# Patient Record
Sex: Male | Born: 1975
Health system: Southern US, Community
[De-identification: ages and names within clinical notes are randomized; demographics above are authoritative.]

## PROBLEM LIST (undated history)

## (undated) DIAGNOSIS — E7800 Pure hypercholesterolemia, unspecified: Secondary | ICD-10-CM

## (undated) DIAGNOSIS — E119 Type 2 diabetes mellitus without complications: Secondary | ICD-10-CM

## (undated) DIAGNOSIS — K819 Cholecystitis, unspecified: Secondary | ICD-10-CM

## (undated) DIAGNOSIS — G4733 Obstructive sleep apnea (adult) (pediatric): Secondary | ICD-10-CM

## (undated) DIAGNOSIS — I10 Essential (primary) hypertension: Secondary | ICD-10-CM

## (undated) HISTORY — PX: ADENOIDECTOMY: SUR15

## (undated) HISTORY — DX: Obstructive sleep apnea (adult) (pediatric): G47.33

## (undated) HISTORY — DX: Essential (primary) hypertension: I10

---

## 2016-07-25 ENCOUNTER — Encounter: Payer: Self-pay | Admitting: Emergency Medicine

## 2016-07-25 ENCOUNTER — Ambulatory Visit
Admission: EM | Admit: 2016-07-25 | Discharge: 2016-07-25 | Disposition: A | Discharge status: Home / Self Care | Payer: Self-pay | Attending: Family Medicine | Admitting: Family Medicine

## 2016-07-25 DIAGNOSIS — R600 Localized edema: Secondary | ICD-10-CM

## 2016-07-25 DIAGNOSIS — I809 Phlebitis and thrombophlebitis of unspecified site: Secondary | ICD-10-CM

## 2016-07-25 DIAGNOSIS — R609 Edema, unspecified: Secondary | ICD-10-CM

## 2016-07-25 NOTE — ED Provider Notes (Signed)
[facility]    CSN: [id number] Arrival date & time: 07/25/16  1506     History   Chief Complaint Chief Complaint  Patient presents with  . Leg Swelling    left leg    HPI Steve [name] is a 41 y.o. male.   41 yo male with a c/o 2 days of lower leg swelling of both legs as well as a slightly tender area of skin on his left shin. Denies any injuries, falls, fevers, chills, drainage, shortness of breath, chest pains.    The history is provided by the patient.    History reviewed. No pertinent past medical history.  There are no active problems to display for this patient.   Past Surgical History:  Procedure Laterality Date  . ADENOIDECTOMY         Home Medications    Prior to Admission medications   Not on File    Family History History reviewed. No pertinent family history.  Social History Social History  Substance Use Topics  . Smoking status: Former Games developer  . Smokeless tobacco: Former Neurosurgeon    Types: Snuff  . Alcohol use No     Allergies   Patient has no known allergies.   Review of Systems Review of Systems   Physical Exam Triage Vital Signs ED Triage Vitals  Enc Vitals Group     BP 07/25/16 1535 135/83     Pulse Rate 07/25/16 1535 66     Resp 07/25/16 1535 17     Temp 07/25/16 1535 98.2 F (36.8 C)     Temp Source 07/25/16 1535 Oral     SpO2 07/25/16 1535 98 %     Weight 07/25/16 1531 (!) 350 lb (158.8 kg)     Height 07/25/16 1531  (1.778 m)     Head Circumference --      Peak Flow --      Pain Score 07/25/16 1531 1     Pain Loc --      Pain Edu? --      Excl. in GC? --    No data found.   Updated Vital Signs BP 135/83 (BP Location: Left Arm)   Pulse 66   Temp 98.2 F (36.8 C) (Oral)   Resp 17   Ht  (1.778 m)   Wt (!) 350 lb (158.8 kg)   SpO2 98%   BMI 50.22 kg/m   Visual Acuity Right Eye Distance:   Left Eye Distance:   Bilateral Distance:    Right Eye Near:   Left Eye Near:      Bilateral Near:     Physical Exam  Constitutional: He appears well-developed and well-nourished. No distress.  Musculoskeletal: He exhibits edema (1+ tibial pitting edema lower extremities bilaterally).  Skin: He is not diaphoretic.  Superficial vein skin area approximately 3cm with few slight indurations on palpation and slightly tender on left anterior shin area; no drainage or bleeding; no warmth or severe erythema  Nursing note and vitals reviewed.    UC Treatments / Results  Labs (all labs ordered are listed, but only abnormal results are displayed) Labs Reviewed - No data to display  EKG  EKG Interpretation None       Radiology No results found.  Procedures Procedures (including critical care time)  Medications Ordered in UC Medications - No data to display   Initial Impression / Assessment and Plan / UC Course  I have reviewed the triage vital  signs and the nursing notes.  Pertinent labs & imaging results that were available during my care of the patient were reviewed by me and considered in my medical decision making (see chart for details).       Final Clinical Impressions(s) / UC Diagnoses   Final diagnoses:  Thrombophlebitis  Dependent edema    New Prescriptions There are no discharge medications for this patient.  1. diagnosis reviewed with patient/parent/guardian/family 2. rx as per orders above; reviewed possible side effects, interactions, risks and benefits  3. Recommend supportive treatment with  4. Follow-up prn if symptoms worsen or don't improve   Payton Mccallum, MD 07/25/16 2052

## 2016-07-25 NOTE — Discharge Instructions (Signed)
Decrease salt intake, elevate legs

## 2016-07-25 NOTE — ED Triage Notes (Signed)
Patient c/o left lower leg swelling for the past 2 days.  Patient states that his blood pressure was elevated today.

## 2017-12-18 DIAGNOSIS — Z23 Encounter for immunization: Secondary | ICD-10-CM | POA: Diagnosis not present

## 2018-06-25 DIAGNOSIS — J069 Acute upper respiratory infection, unspecified: Secondary | ICD-10-CM | POA: Diagnosis not present

## 2018-08-15 DIAGNOSIS — R109 Unspecified abdominal pain: Secondary | ICD-10-CM | POA: Diagnosis not present

## 2018-08-15 DIAGNOSIS — K8020 Calculus of gallbladder without cholecystitis without obstruction: Secondary | ICD-10-CM | POA: Diagnosis not present

## 2018-08-15 DIAGNOSIS — R1110 Vomiting, unspecified: Secondary | ICD-10-CM | POA: Diagnosis not present

## 2018-08-15 DIAGNOSIS — A0839 Other viral enteritis: Secondary | ICD-10-CM | POA: Diagnosis not present

## 2018-08-15 DIAGNOSIS — K429 Umbilical hernia without obstruction or gangrene: Secondary | ICD-10-CM | POA: Diagnosis not present

## 2018-08-15 DIAGNOSIS — Z1159 Encounter for screening for other viral diseases: Secondary | ICD-10-CM | POA: Diagnosis not present

## 2018-08-15 DIAGNOSIS — K529 Noninfective gastroenteritis and colitis, unspecified: Secondary | ICD-10-CM | POA: Diagnosis not present

## 2018-09-25 ENCOUNTER — Emergency Department: Payer: BC Managed Care – PPO

## 2018-09-25 ENCOUNTER — Encounter: Payer: Self-pay | Admitting: *Deleted

## 2018-09-25 ENCOUNTER — Emergency Department
Admission: EM | Admit: 2018-09-25 | Discharge: 2018-09-25 | Disposition: A | Discharge status: Home / Self Care | Payer: BC Managed Care – PPO | Attending: Emergency Medicine | Admitting: Emergency Medicine

## 2018-09-25 ENCOUNTER — Other Ambulatory Visit: Payer: Self-pay

## 2018-09-25 DIAGNOSIS — R0602 Shortness of breath: Secondary | ICD-10-CM | POA: Diagnosis not present

## 2018-09-25 DIAGNOSIS — F17203 Nicotine dependence unspecified, with withdrawal: Secondary | ICD-10-CM | POA: Diagnosis not present

## 2018-09-25 DIAGNOSIS — F419 Anxiety disorder, unspecified: Secondary | ICD-10-CM | POA: Diagnosis not present

## 2018-09-25 DIAGNOSIS — F410 Panic disorder [episodic paroxysmal anxiety] without agoraphobia: Secondary | ICD-10-CM

## 2018-09-25 MED ORDER — LORAZEPAM 1 MG PO TABS: 1.0000 mg | ORAL_TABLET | Freq: Three times a day (TID) | ORAL | 0 refills | Status: DC | PRN

## 2018-09-25 MED ORDER — NICOTINE POLACRILEX 4 MG MT GUM: 4.0000 mg | CHEWING_GUM | OROMUCOSAL | 1 refills | Status: DC | PRN

## 2018-09-25 NOTE — ED Notes (Signed)
Pt refusing blood work at this time and verbalized only wanting to have his heart and lungs checked.

## 2018-09-25 NOTE — ED Triage Notes (Signed)
Pt to ED reporting a panic attack earlier today. No hx of anxiety. Pt reports feeling SOB last night and reports fears with COVID and the potential for getting sick made his anxiety worse today.   Pt no longer feeling SOB. No fevers or cough. Pt is also withdrawling from nicotine for the past 48 hours.

## 2018-09-25 NOTE — ED Provider Notes (Signed)
[facility] Emergency Department Provider Note  ____________________________________________  Time seen: Approximately 9:22 PM  I have reviewed the triage vital signs and the nursing notes.   HISTORY  Chief Complaint Panic Attack and Shortness of Breath    HPI Steve [name] is a 43 y.o. male who presents the emergency department complaining of possible panic/anxiety attack.  Patient reports that he has stopped using nicotine-containing products, specifically drip over the past 48 hours.  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  Patient began having shortness of breath and believes that he had a slight panic attack.  Patient has no history of anxiety or panic attacks.  Patient was able to control his breathing and symptoms resolved.  Patient presented to the emergency department for evaluation to ensure there was no other underlying condition.  Patient initially declined any imaging or labs until being seen by a provider.  Patient is completely asymptomatic at this time.         History reviewed. No pertinent past medical history.  There are no active problems to display for this patient.   Past Surgical History:  Procedure Laterality Date  . ADENOIDECTOMY      Prior to Admission medications   Medication Sig Start Date End Date Taking? Authorizing Provider  LORazepam (ATIVAN) 1 MG tablet Take 1 tablet (1 mg total) by mouth every 8 (eight) hours as needed for anxiety. 09/25/18 09/25/19  Arilla Hice, Delorise RoyalsJonathan D, PA-C  nicotine polacrilex (NICORETTE) 4 MG gum Take 1 each (4 mg total) by mouth as needed for smoking cessation. 09/25/18   Kenyetta Wimbish, Delorise RoyalsJonathan D, PA-C    Allergies Patient has no known allergies.  History reviewed. No pertinent family history.  Social History Social History   Tobacco Use  . Smoking status: Former Games developermoker  . Smokeless  tobacco: Former NeurosurgeonUser    Types: Snuff  Substance Use Topics  . Alcohol use: No  . Drug use: No     Review of Systems  Constitutional: No fever/chills Eyes: No visual changes. No discharge ENT: No upper respiratory complaints. Cardiovascular: no chest pain. Respiratory: no cough.  Shortness of breath that has completely resolved at this time. Gastrointestinal: No abdominal pain.  No nausea, no vomiting.  No diarrhea.  No constipation. Musculoskeletal: Negative for musculoskeletal pain. Skin: Negative for rash, abrasions, lacerations, ecchymosis. Neurological: Negative for headaches, focal weakness or numbness. Psychological: Possible anxiety/panic attack 10-point ROS otherwise negative.  ____________________________________________   PHYSICAL EXAM:  VITAL SIGNS: ED Triage Vitals  Enc Vitals Group     BP 09/25/18 1800 131/76     Pulse Rate 09/25/18 1800 60     Resp 09/25/18 1800 16     Temp 09/25/18 2113 98.6 F (37 C)     Temp Source 09/25/18 1800 Oral     SpO2 09/25/18 1800 98 %     Weight --      Height --      Head Circumference --      Peak Flow --      Pain Score 09/25/18 1800 0     Pain Loc --      Pain Edu? --      Excl. in GC? --      Constitutional: Alert and oriented. Well appearing and in no acute distress. Eyes: Conjunctivae are normal. PERRL. EOMI. Head: Atraumatic. ENT:      Ears:  Nose: No congestion/rhinnorhea.      Mouth/Throat: Mucous membranes are moist.  Neck: No stridor.   Hematological/Lymphatic/Immunilogical: No cervical lymphadenopathy. Cardiovascular: Normal rate, regular rhythm. Normal S1 and S2.  Good peripheral circulation. Respiratory: Normal respiratory effort without tachypnea or retractions. Lungs CTAB. Good air entry to the bases with no decreased or absent breath sounds. Musculoskeletal: Full range of motion to all extremities. No gross deformities appreciated. Neurologic:  Normal speech and language. No gross focal  neurologic deficits are appreciated.  Skin:  Skin is warm, dry and intact. No rash noted. Psychiatric: Mood and affect are normal. Speech and behavior are normal. Patient exhibits appropriate insight and judgement.   ____________________________________________   LABS (all labs ordered are listed, but only abnormal results are displayed)  Labs Reviewed - No data to display ____________________________________________  EKG  ED ECG REPORT I, Charline Bills Joevanni Roddey,  personally viewed and interpreted this ECG.   Date: 09/25/2018  EKG Time: 1802 hrs.  Rate: 59 bpm  Rhythm: there are no previous tracings available for comparison, sinus bradycardia, left axis deviation  Axis: Left axis deviation  Intervals:none  ST&T Change: No ST elevation or depression noted.  Sinus bradycardia.  No STEMI.  ____________________________________________  RADIOLOGY   No results found.  ____________________________________________    PROCEDURES  Procedure(s) performed:    Procedures    Medications - No data to display   ____________________________________________   INITIAL IMPRESSION / ASSESSMENT AND PLAN / ED COURSE  Pertinent labs & imaging results that were available during my care of the patient were reviewed by me and considered in my medical decision making (see chart for details).  Review of the [place] CSRS was performed in accordance of the [place] prior to dispensing any controlled drugs.           Patient's diagnosis is consistent with nicotine withdrawal, panic attack.  Patient presented to the emergency department concern for possible anxiety attack.  Patient reports that he has quit nicotine abruptly 48 hours ago.  Patient is having some withdrawal symptoms and had what apparently was a panic attack this afternoon.  Patient is asymptomatic at this time.  Initially patient declined any imaging or labs until he could see a provider in the emergency department.  After  physical exam, discussing symptoms with patient, no indication for labs or imaging.  Patient feels very comfortable foregoing labs or imaging at this time.  Patient had findings consistent with a panic attack.  I do believe that this is secondary to both nicotine withdrawal as well as constantly changing temperatures today.  I discussed withdrawal symptoms.  After discussion, patient is agreeable to using nicotine gum to help stop his DIP use.  Patient will also be prescribed very limited prescription for low-dose Ativan for any return of panic attack while he is withdrawing from his nicotine.  Patient is very aware of side effects as well as indications to use the Ativan for panic attacks.  Patient is from [place] and is interested in establishing primary care here in town.  Patient will be referred to primary care.  Follow-up with primary care as needed. Patient is given ED precautions to return to the ED for any worsening or new symptoms.     ____________________________________________  FINAL CLINICAL IMPRESSION(S) / ED DIAGNOSES  Final diagnoses:  Nicotine withdrawal  Anxiety attack      NEW MEDICATIONS STARTED DURING THIS VISIT:  ED Discharge Orders         Ordered  LORazepam (ATIVAN) 1 MG tablet  Every 8 hours PRN     09/25/18 2206    nicotine polacrilex (NICORETTE) 4 MG gum  As needed     09/25/18 2206              This chart was dictated using voice recognition software/Dragon. Despite best efforts to proofread, errors can occur which can change the meaning. Any change was purely unintentional.    Racheal PatchesCuthriell, Detrick Dani D, PA-C 09/25/18 2206    Phineas SemenGoodman, Gray[place], MD 09/25/18 2242

## 2018-10-10 DIAGNOSIS — R600 Localized edema: Secondary | ICD-10-CM | POA: Diagnosis not present

## 2018-10-10 DIAGNOSIS — E669 Obesity, unspecified: Secondary | ICD-10-CM | POA: Diagnosis not present

## 2018-10-10 DIAGNOSIS — Z833 Family history of diabetes mellitus: Secondary | ICD-10-CM | POA: Diagnosis not present

## 2018-10-30 DIAGNOSIS — R05 Cough: Secondary | ICD-10-CM | POA: Diagnosis not present

## 2018-10-30 DIAGNOSIS — J029 Acute pharyngitis, unspecified: Secondary | ICD-10-CM | POA: Diagnosis not present

## 2018-11-06 DIAGNOSIS — H5213 Myopia, bilateral: Secondary | ICD-10-CM | POA: Diagnosis not present

## 2018-11-07 DIAGNOSIS — B9689 Other specified bacterial agents as the cause of diseases classified elsewhere: Secondary | ICD-10-CM | POA: Diagnosis not present

## 2018-11-07 DIAGNOSIS — M545 Low back pain: Secondary | ICD-10-CM | POA: Diagnosis not present

## 2018-11-07 DIAGNOSIS — M5442 Lumbago with sciatica, left side: Secondary | ICD-10-CM | POA: Diagnosis not present

## 2018-11-07 DIAGNOSIS — Z87898 Personal history of other specified conditions: Secondary | ICD-10-CM | POA: Diagnosis not present

## 2018-11-07 DIAGNOSIS — J019 Acute sinusitis, unspecified: Secondary | ICD-10-CM | POA: Diagnosis not present

## 2018-11-21 DIAGNOSIS — M26609 Unspecified temporomandibular joint disorder, unspecified side: Secondary | ICD-10-CM | POA: Diagnosis not present

## 2018-11-21 DIAGNOSIS — K140 Glossitis: Secondary | ICD-10-CM | POA: Diagnosis not present

## 2019-01-02 DIAGNOSIS — Z23 Encounter for immunization: Secondary | ICD-10-CM | POA: Diagnosis not present

## 2019-01-03 ENCOUNTER — Emergency Department: Payer: BC Managed Care – PPO

## 2019-01-03 ENCOUNTER — Encounter: Payer: Self-pay | Admitting: Emergency Medicine

## 2019-01-03 ENCOUNTER — Emergency Department
Admission: EM | Admit: 2019-01-03 | Discharge: 2019-01-03 | Disposition: A | Discharge status: Home / Self Care | Payer: BC Managed Care – PPO | Attending: Student | Admitting: Student

## 2019-01-03 ENCOUNTER — Other Ambulatory Visit: Payer: Self-pay

## 2019-01-03 DIAGNOSIS — R079 Chest pain, unspecified: Secondary | ICD-10-CM | POA: Diagnosis not present

## 2019-01-03 DIAGNOSIS — Z733 Stress, not elsewhere classified: Secondary | ICD-10-CM | POA: Diagnosis not present

## 2019-01-03 DIAGNOSIS — F410 Panic disorder [episodic paroxysmal anxiety] without agoraphobia: Secondary | ICD-10-CM | POA: Diagnosis not present

## 2019-01-03 DIAGNOSIS — Z87891 Personal history of nicotine dependence: Secondary | ICD-10-CM | POA: Insufficient documentation

## 2019-01-03 DIAGNOSIS — F439 Reaction to severe stress, unspecified: Secondary | ICD-10-CM | POA: Diagnosis not present

## 2019-01-03 DIAGNOSIS — R0789 Other chest pain: Secondary | ICD-10-CM | POA: Diagnosis not present

## 2019-01-03 LAB — BASIC METABOLIC PANEL
Anion gap: 8 (ref 5–15)
BUN: 10 mg/dL (ref 6–20)
CO2: 27 mmol/L (ref 22–32)
Calcium: 8.8 mg/dL — ABNORMAL LOW (ref 8.9–10.3)
Chloride: 104 mmol/L (ref 98–111)
Creatinine, Ser: 0.9 mg/dL (ref 0.61–1.24)
GFR calc Af Amer: >60 mL/min (ref >60)
GFR calc non Af Amer: >60 mL/min (ref >60)
Glucose, Bld: 100 mg/dL — ABNORMAL HIGH (ref 70–99)
Potassium: 3.9 mmol/L (ref 3.5–5.1)
Sodium: 139 mmol/L (ref 135–145)

## 2019-01-03 LAB — CBC
HCT: 45.6 % (ref 39.0–52.0)
Hemoglobin: 15 g/dL (ref 13.0–17.0)
MCH: 28.1 pg (ref 26.0–34.0)
MCHC: 32.9 g/dL (ref 30.0–36.0)
MCV: 85.4 fL (ref 80.0–100.0)
Platelets: 312 10*3/uL (ref 150–400)
RBC: 5.34 MIL/uL (ref 4.22–5.81)
RDW: 13.4 % (ref 11.5–15.5)
WBC: 11.2 10*3/uL — ABNORMAL HIGH (ref 4.0–10.5)
nRBC: 0 % (ref 0.0–0.2)

## 2019-01-03 LAB — TROPONIN I (HIGH SENSITIVITY)
Troponin I (High Sensitivity): 4 ng/L (ref <18)
Troponin I (High Sensitivity): 4 ng/L (ref <18)

## 2019-01-03 MED ORDER — ASPIRIN 81 MG PO CHEW
324.0000 mg | CHEWABLE_TABLET | Freq: Once | ORAL | Status: AC
  Administered 2019-01-03: 13:00:00 324 mg via ORAL
  Filled 2019-01-03: qty 4

## 2019-01-03 NOTE — ED Provider Notes (Signed)
[facility] Emergency Department Provider Note  ____________________________________________   First MD Initiated Contact with Patient 01/03/19 1208     (approximate)  I have reviewed the triage vital signs and the nursing notes.  History  Chief Complaint Chest Pain    HPI Steve [name] is a 43 y.o. male with history of obesity who presents to the ED for chest pain. Patient states he has had on and off episodes of chest discomfort for the past day or so. Mild in severity. Describes it like a pressure or muscle cramping sensation. Radiates somewhat to his left arm. These episodes never last longer than an hour. They are brought on by stress and anxiety. He states he has had a few anxiety attacks (which are new for him) since the [place] pandemic started. He has no associated lightheadedness, syncope, nausea, vomiting, SOB, or diaphoresis with these events. Does not smoke. FHx of cardiac disease. No recent travel, hx of VTE, hemoptysis. He denies any symptoms at present. No recent changes to activity or exercise.    Past Medical Hx History reviewed. No pertinent past medical history.  Problem List There are no active problems to display for this patient.   Past Surgical Hx Past Surgical History:  Procedure Laterality Date  . ADENOIDECTOMY      Medications Prior to Admission medications   Medication Sig Start Date End Date Taking? Authorizing Provider  LORazepam (ATIVAN) 1 MG tablet Take 1 tablet (1 mg total) by mouth every 8 (eight) hours as needed for anxiety. 09/25/18 09/25/19  Cuthriell, Charline Bills, PA-C  nicotine polacrilex (NICORETTE) 4 MG gum Take 1 each (4 mg total) by mouth as needed for smoking cessation. 09/25/18   Cuthriell, Charline Bills, PA-C    Allergies Patient has no known allergies.  Family Hx No family history on file.  Social Hx Social History   Tobacco Use  . Smoking status: Former Research scientist (life sciences)  . Smokeless tobacco: Former Systems developer     Types: Snuff  Substance Use Topics  . Alcohol use: No  . Drug use: No     Review of Systems  Constitutional: Negative for fever, chills. Eyes: Negative for visual changes. ENT: Negative for sore throat. Cardiovascular: + for chest pain. Respiratory: Negative for shortness of breath. Gastrointestinal: Negative for nausea, vomiting.  Genitourinary: Negative for dysuria. Musculoskeletal: Negative for leg swelling. Skin: Negative for rash. Neurological: Negative for for headaches.   Physical Exam  Vital Signs: ED Triage Vitals  Enc Vitals Group     BP 01/03/19 1037 (!) 149/102     Pulse Rate 01/03/19 1037 72     Resp 01/03/19 1037 16     Temp 01/03/19 1037 98.2 F (36.8 C)     Temp Source 01/03/19 1037 Oral     SpO2 01/03/19 1037 98 %     Weight 01/03/19 1038 (!) 340 lb (154.2 kg)     Height 01/03/19 1038 5\' 10"  (1.778 m)     Head Circumference --      Peak Flow --      Pain Score 01/03/19 1038 1     Pain Loc --      Pain Edu? --      Excl. in [place]? --     Constitutional: Alert and oriented.  Head: Normocephalic. Atraumatic. Eyes: Conjunctivae clear. Sclera anicteric. Nose: No congestion. No rhinorrhea. Mouth/Throat: Mucous membranes are moist.  Neck: No stridor.   Cardiovascular: Normal rate, regular rhythm. No murmurs. Extremities well perfused.  Respiratory: Normal respiratory effort.  Lungs CTAB. Gastrointestinal: Soft. Non-tender. Non-distended.  Musculoskeletal: No lower extremity edema. No deformities. Neurologic:  Normal speech and language. No gross focal neurologic deficits are appreciated.  Skin: Skin is warm, dry and intact. No rash noted. Psychiatric: Mood and affect are appropriate for situation.  EKG  Personally reviewed.   Rate: 74 Rhythm: sinus Axis: LAD Intervals: WNL No acute ischemic changes No STEMI    Radiology  XR: IMPRESSION:  No active cardiopulmonary disease.    Procedures  Procedure(s) performed (including critical  care):  Procedures   Initial Impression / Assessment and Plan / ED Course  43 y.o. male who presents to the ED for intermittent episodes of chest discomfort, as above.  Ddx: ACS, MSK, anxiety. Patient is PERC negative. Assuming initial troponin negative, patient would be low risk HEART score   Plan: labs, EKG  EKG w/o acute ischemic changes. XR negative. Troponin x 2 negative. Given unremarkable work up, will plan for discharge with outpatient follow up. Given information for establishing PCP and return precautions. Patient voices understanding and is comfortable w/ the plan and discharge.    Final Clinical Impression(s) / ED Diagnosis  Final diagnoses:  Chest pain in adult  Stress       Note:  This document was prepared using Dragon voice recognition software and may include unintentional dictation errors.   Miguel Aschoff., MD 01/03/19 [phone]

## 2019-01-03 NOTE — ED Triage Notes (Signed)
Says left upper chest tightness -to shoulder since yesterday.  Sent from [facility]

## 2019-01-03 NOTE — Discharge Instructions (Addendum)
Thank you for letting us take care of you in the emergency department today.   Please follow up with a primary care doctor to review your ER visit and follow-up on your symptoms.  Information for potential clinics are below.  Please call to establish care.  Please return to the emergency department for any new or worsening symptoms.

## 2019-03-26 ENCOUNTER — Other Ambulatory Visit: Payer: Self-pay

## 2019-03-26 ENCOUNTER — Emergency Department: Payer: BC Managed Care – PPO

## 2019-03-26 ENCOUNTER — Emergency Department
Admission: EM | Admit: 2019-03-26 | Discharge: 2019-03-26 | Disposition: A | Discharge status: Home / Self Care | Payer: BC Managed Care – PPO | Attending: Emergency Medicine | Admitting: Emergency Medicine

## 2019-03-26 ENCOUNTER — Encounter: Payer: Self-pay | Admitting: Emergency Medicine

## 2019-03-26 DIAGNOSIS — Z5321 Procedure and treatment not carried out due to patient leaving prior to being seen by health care provider: Secondary | ICD-10-CM | POA: Diagnosis not present

## 2019-03-26 DIAGNOSIS — R0789 Other chest pain: Secondary | ICD-10-CM | POA: Insufficient documentation

## 2019-03-26 DIAGNOSIS — R079 Chest pain, unspecified: Secondary | ICD-10-CM | POA: Diagnosis not present

## 2019-03-26 HISTORY — DX: Cholecystitis, unspecified: K81.9

## 2019-03-26 LAB — CBC
HCT: 45.5 % (ref 39.0–52.0)
Hemoglobin: 14.9 g/dL (ref 13.0–17.0)
MCH: 28.3 pg (ref 26.0–34.0)
MCHC: 32.7 g/dL (ref 30.0–36.0)
MCV: 86.3 fL (ref 80.0–100.0)
Platelets: 315 10*3/uL (ref 150–400)
RBC: 5.27 MIL/uL (ref 4.22–5.81)
RDW: 13.2 % (ref 11.5–15.5)
WBC: 9.4 10*3/uL (ref 4.0–10.5)
nRBC: 0 % (ref 0.0–0.2)

## 2019-03-26 LAB — BASIC METABOLIC PANEL
Anion gap: 8 (ref 5–15)
BUN: 13 mg/dL (ref 6–20)
CO2: 27 mmol/L (ref 22–32)
Calcium: 9 mg/dL (ref 8.9–10.3)
Chloride: 103 mmol/L (ref 98–111)
Creatinine, Ser: 0.99 mg/dL (ref 0.61–1.24)
GFR calc Af Amer: >60 mL/min (ref >60)
GFR calc non Af Amer: >60 mL/min (ref >60)
Glucose, Bld: 87 mg/dL (ref 70–99)
Potassium: 4 mmol/L (ref 3.5–5.1)
Sodium: 138 mmol/L (ref 135–145)

## 2019-03-26 LAB — TROPONIN I (HIGH SENSITIVITY)
Troponin I (High Sensitivity): 4 ng/L (ref <18)
Troponin I (High Sensitivity): 4 ng/L (ref <18)

## 2019-03-26 MED ORDER — SODIUM CHLORIDE 0.9% FLUSH: 3.0000 mL | Freq: Once | INTRAVENOUS | Status: DC

## 2019-03-26 NOTE — ED Notes (Signed)
Pt states he does have anxiety at times and feel that is an issue, also states after lunch he had a bowel movement and it helped with the chest pain.

## 2019-03-26 NOTE — ED Notes (Signed)
Pt ambulatory to STAT without difficulty or distress noted; pt reports that he needs to leave now due to long wait; denies any c/o now; instr to f/u with his PCP tomorrow and return immed for any new or worsening symptoms

## 2019-03-26 NOTE — ED Triage Notes (Signed)
Pt here with c/o chest discomfort that began around lunchtime today. Has gallbladder issues, but wanted to come get checked to be sure his heart isn't involved. NAD, speaking in full sentences in triage.

## 2019-03-29 ENCOUNTER — Telehealth: Payer: Self-pay | Admitting: Emergency Medicine

## 2019-03-29 NOTE — Telephone Encounter (Signed)
Called patient due to lwot to inquire about condition and follow up plans. Says he is feeling fine.  Encouraged him to notify his pcp of the symptoms he had.

## 2019-04-10 ENCOUNTER — Telehealth: Payer: Self-pay

## 2019-04-10 NOTE — Telephone Encounter (Signed)
Copied from CRM [id number]. Topic: General - Other >> Apr 10, 2019  8:32 AM Steve [name] wrote: Reason for CRM: Pt called and set up a new patient appt. Pt states he would like to be sooner than 04/22/19 due to some severe anxiety issues he is having. Please advise.

## 2019-04-10 NOTE — Telephone Encounter (Signed)
Looking at your schedule there is no availability to put patient in before the 25th. Please advise if you think you can squeeze him in otherwise I will have to offer after the 25th because Steve [name] is not taking anymore new patients at this time. KW

## 2019-04-10 NOTE — Telephone Encounter (Signed)
Please keep current time/ appointment 04/22/2019 for new patient with the given shortage in office currently  of providers due to Covid pandemic.  I advise if emergent symptoms UC/ ER advised or if any suicidal / homicidal ideations/ intents to ER immediately.   If he feels non urgent then he  may also reach out to [facility] through their web site here in [place] for psychiatric care/ counseling for evaluation as well and keep scheduled appointment for new patient on the 25 th. [facility] has tele health and in person visits for psychiatric care.

## 2019-04-10 NOTE — Telephone Encounter (Signed)
Patient was advised of message as below, he will keep his appt as scheduled on 04/22/19. KW

## 2019-04-22 ENCOUNTER — Ambulatory Visit: Payer: BC Managed Care – PPO | Admitting: Adult Health

## 2019-04-22 ENCOUNTER — Encounter: Payer: Self-pay | Admitting: Adult Health

## 2019-04-22 ENCOUNTER — Other Ambulatory Visit: Payer: Self-pay

## 2019-04-22 VITALS — BP 124/86 | HR 66 | Temp 97.1°F | Resp 16 | Ht 70.5 in | Wt 344.4 lb

## 2019-04-22 DIAGNOSIS — F1290 Cannabis use, unspecified, uncomplicated: Secondary | ICD-10-CM | POA: Insufficient documentation

## 2019-04-22 DIAGNOSIS — Z87898 Personal history of other specified conditions: Secondary | ICD-10-CM

## 2019-04-22 DIAGNOSIS — Z8249 Family history of ischemic heart disease and other diseases of the circulatory system: Secondary | ICD-10-CM | POA: Diagnosis not present

## 2019-04-22 DIAGNOSIS — M25532 Pain in left wrist: Secondary | ICD-10-CM

## 2019-04-22 DIAGNOSIS — Z0000 Encounter for general adult medical examination without abnormal findings: Secondary | ICD-10-CM

## 2019-04-22 DIAGNOSIS — R1011 Right upper quadrant pain: Secondary | ICD-10-CM

## 2019-04-22 DIAGNOSIS — Z13220 Encounter for screening for lipoid disorders: Secondary | ICD-10-CM | POA: Diagnosis not present

## 2019-04-22 DIAGNOSIS — F419 Anxiety disorder, unspecified: Secondary | ICD-10-CM

## 2019-04-22 DIAGNOSIS — M25531 Pain in right wrist: Secondary | ICD-10-CM | POA: Diagnosis not present

## 2019-04-22 DIAGNOSIS — R202 Paresthesia of skin: Secondary | ICD-10-CM | POA: Diagnosis not present

## 2019-04-22 DIAGNOSIS — Z1329 Encounter for screening for other suspected endocrine disorder: Secondary | ICD-10-CM

## 2019-04-22 DIAGNOSIS — Z6841 Body Mass Index (BMI) 40.0 and over, adult: Secondary | ICD-10-CM

## 2019-04-22 DIAGNOSIS — R5383 Other fatigue: Secondary | ICD-10-CM

## 2019-04-22 LAB — POCT URINALYSIS DIPSTICK
Bilirubin, UA: NEGATIVE
Blood, UA: NEGATIVE
Glucose, UA: NEGATIVE
Ketones, UA: NEGATIVE
Leukocytes, UA: NEGATIVE
Nitrite, UA: NEGATIVE
Protein, UA: POSITIVE — AB
Spec Grav, UA: >=1.03 — AB (ref 1.010–1.025)
Urobilinogen, UA: 0.2 E.U./dL
pH, UA: 6 (ref 5.0–8.0)

## 2019-04-22 NOTE — Patient Instructions (Signed)

## 2019-04-22 NOTE — Progress Notes (Signed)
Patient: Steve [name], Male    DOB: 11-02-1975, 44 y.o.   MRN: [id number] Visit Date: 04/22/2019  Today's Provider: Jairo Ben, FNP   Chief Complaint  Patient presents with  . New Patient (Initial Visit)   Subjective:    Annual physical exam Steve [name] is a 44 y.o. male who presents today for health maintenance and establish care as a new patient. Patient states that he would like to address today pain in both hands that radiates to his forearms. He feels fairly well. He reports he is not actively  exercising . He reports he is sleeping well. Patient address concern that he needs to lose weight and increase activity as he has a relatively sedentary lifestyle. He desires to have labs done today.   He has a history of anxiety attacks, he has been seen in the emergency room for chest pain described as mild episodes of on and off chest discomfort brought on by stress.  Troponin's have been negative.  EKG from emergency room was reviewed with patient  Sinus rhythm with Premature supraventricular complexes Left axis deviation Minimal voltage criteria for LVH, may be normal variant Anterior infarct (cited on or before 25-Sep-2018) Abnormal ECGWhen compared with ECG of 03-Jan-2019 10:32 He denies any associated syncope, dizziness, lightheadedness or diaphoresis.  He denies having any history of coronary artery disease, stents in the past, previous heart attacks that he is aware of.  He denies any chest pain, back pain, abdominal pain or shortness of breath during this visit.  He denies any pain or shortness of breath this month. He does report that he gets fatigued easily and he attributes this to his obesity.  He is using CBD oil for his anxiety and reports he it is helping him and he does not desire to stop using and is not interested in prescription medications   Oil based he places under his tongue.   Decreased grip in bilateral hands. 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 Denies any neck pain.  History of sciatica lumbar in past.  Denies any injury.   He has been fatigued recently.  He has a history of gallbladder he was seen in the emergency room. He was diagnosed in [place]. He will send those records. This was May 19 th of last year 2020.  He has discomfort with fatty foods and high sugary foods.   Eye exam - yearly.   He sees a Education officer, community as well but not usually yearly.   Did see ENT after stopped chewing tobacco,  History of smoking as briefly early 20's. Stopped 22 years ago.  211 days ago he quit smokeless chewing  Tobacco 15 years. . Denies any vaping. He denies any other drug use.   Maternal Grandfather 70-80's had diabetes and heart attack His father had MI at age 32 - smoked three packs per day he reports.   Patient  denies any fever, body aches,chills, rash, chest pain, shortness of breath, nausea, vomiting, or diarrhea.   -----------------------------------------------------------------   Review of Systems  Constitutional: Positive for fatigue. Negative for activity change, appetite change, chills, diaphoresis, fever and unexpected weight change.  HENT: Negative.   Eyes: Negative.   Respiratory: Negative for apnea, cough, choking, chest tightness, shortness of breath, wheezing and stridor.   Cardiovascular: Positive for chest pain (history of - no active pain today. ). Negative for palpitations and leg swelling.  Gastrointestinal: Positive for  abdominal pain (history of RUQ pain, reports [facility]bladder issues [facility], he will send records. No pain today. ). Negative for abdominal distention, anal bleeding, blood in stool, constipation, diarrhea, nausea, rectal pain and vomiting.  Endocrine: Negative.   Genitourinary: Negative.   Musculoskeletal: Positive for arthralgias (bilateral pain/ wrist with numbness tingling ). Negative for back pain, gait problem, joint swelling, myalgias, neck pain  and neck stiffness.  Skin: Negative.   Allergic/Immunologic: Positive for environmental allergies.  Neurological: Positive for numbness (numbness and tingling 2nd 3rd 4th digit on right and left hand ). Negative for dizziness, tremors, seizures, syncope, facial asymmetry, speech difficulty, weakness, light-headedness and headaches.  Hematological: Negative for adenopathy. Does not bruise/bleed easily.  Psychiatric/Behavioral: Negative for agitation, behavioral problems, confusion, decreased concentration, dysphoric mood, hallucinations, self-injury, sleep disturbance and suicidal ideas. The patient is nervous/anxious. The patient is not hyperactive.   All other systems reviewed and are negative.   Social History He  reports that he quit smoking about 6 months ago. He has quit using smokeless tobacco.  His smokeless tobacco use included snuff. He reports current alcohol use of about 2.0 - 3.0 standard drinks of alcohol per week. He reports that he does not use drugs. Social History   Socioeconomic History  . Marital status: Single    Spouse name: Not on file  . Number of children: Not on file  . Years of education: Not on file  . Highest education level: Not on file  Occupational History  . Not on file  Tobacco Use  . Smoking status: Former Smoker    Quit date: 09/24/2018    Years since quitting: 0.5  . Smokeless tobacco: Former NeurosurgeonUser    Types: Snuff  Substance and Sexual Activity  . Alcohol use: Yes    Alcohol/week: 2.0 - 3.0 standard drinks    Types: 2 - 3 Cans of beer per week  . Drug use: No  . Sexual activity: Not Currently  Other Topics Concern  . Not on file  Social History Narrative  . Not on file   Social Determinants of Health   Financial Resource Strain:   . Difficulty of Paying Living Expenses: Not on file  Food Insecurity:   . Worried About Programme researcher, broadcasting/film/videounning Out of Food in the Last Year: Not on file  . Ran Out of Food in the Last Year: Not on file  Transportation Needs:     . Lack of Transportation (Medical): Not on file  . Lack of Transportation (Non-Medical): Not on file  Physical Activity:   . Days of Exercise per Week: Not on file  . Minutes of Exercise per Session: Not on file  Stress:   . Feeling of Stress : Not on file  Social Connections:   . Frequency of Communication with Friends and Family: Not on file  . Frequency of Social Gatherings with Friends and Family: Not on file  . Attends Religious Services: Not on file  . Active Member of Clubs or Organizations: Not on file  . Attends BankerClub or Organization Meetings: Not on file  . Marital Status: Not on file    Patient Active Problem List   Diagnosis Date Noted  . Obesity 04/22/2019    Past Surgical History:  Procedure Laterality Date  . ADENOIDECTOMY      Family History  Family Status  Relation Name Status  . Mother  (Not Specified)  . MGF  (Not Specified)   His family history includes Diabetes in his maternal  grandfather and mother.     No Known Allergies  Previous Medications   LORAZEPAM (ATIVAN) 1 MG TABLET    Take 1 tablet (1 mg total) by mouth every 8 (eight) hours as needed for anxiety.    Patient Care Team: Berniece Pap, FNP as PCP - General (Family Medicine)      Objective:   Vitals: BP 124/86   Pulse 66   Temp (!) 97.1 F (36.2 C) (Oral)   Resp 16   Ht 5' 10.5" (1.791 m)   Wt (!) 344 lb 6.4 oz (156.2 kg)   SpO2 98%   BMI 48.72 kg/m    Physical Exam Vitals and nursing note reviewed.  Constitutional:      General: He is not in acute distress.    Appearance: Normal appearance. He is well-developed. He is obese. He is not ill-appearing, toxic-appearing or diaphoretic.     Comments: Patient is alert and oriented and responsive to questions Engages in eye contact with provider. Speaks in full sentences without any pauses without any shortness of breath or distress.    HENT:     Head: Normocephalic and atraumatic.     Right Ear: Hearing, tympanic  membrane, ear canal and external ear normal.     Left Ear: Hearing, tympanic membrane, ear canal and external ear normal.     Nose: Nose normal.     Mouth/Throat:     Mouth: Mucous membranes are moist.     Pharynx: Oropharynx is clear. Uvula midline. No oropharyngeal exudate or posterior oropharyngeal erythema.  Eyes:     General: Lids are normal. No scleral icterus.       Right eye: No discharge.        Left eye: No discharge.     Conjunctiva/sclera: Conjunctivae normal.     Pupils: Pupils are equal, round, and reactive to light.  Neck:     Thyroid: No thyromegaly.     Vascular: Normal carotid pulses. No carotid bruit, hepatojugular reflux or JVD.     Trachea: Trachea and phonation normal. No tracheal tenderness or tracheal deviation.     Meningeal: Brudzinski's sign absent.  Cardiovascular:     Rate and Rhythm: Normal rate and regular rhythm.     Pulses: Normal pulses.     Heart sounds: Normal heart sounds, S1 normal and S2 normal. Heart sounds not distant. No murmur. No friction rub. No gallop.   Pulmonary:     Effort: Pulmonary effort is normal. No accessory muscle usage or respiratory distress.     Breath sounds: Normal breath sounds. No stridor. No wheezing or rales.  Chest:     Chest wall: No tenderness.  Abdominal:     General: Bowel sounds are normal. There is no distension.     Palpations: Abdomen is soft. There is no mass.     Tenderness: There is no abdominal tenderness. There is no guarding or rebound.     Hernia: No hernia is present.  Genitourinary:    Comments: deferred denies any concerns.  Musculoskeletal:        General: No tenderness or deformity. Normal range of motion.     Cervical back: Full passive range of motion without pain, normal range of motion and neck supple.     Comments: Patient moves on and off of exam table and in room without difficulty. Gait is normal in hall and in room. Patient is oriented to person place time and situation. Patient answers  questions appropriately and engages  in conversation.   Lymphadenopathy:     Head:     Right side of head: No submental, submandibular, tonsillar, preauricular, posterior auricular or occipital adenopathy.     Left side of head: No submental, submandibular, tonsillar, preauricular, posterior auricular or occipital adenopathy.     Cervical: No cervical adenopathy.  Skin:    General: Skin is warm and dry.     Capillary Refill: Capillary refill takes less than 2 seconds.     Coloration: Skin is not jaundiced or pale.     Findings: No bruising, erythema, lesion or rash.     Nails: There is no clubbing.  Neurological:     General: No focal deficit present.     Mental Status: He is alert and oriented to person, place, and time.     GCS: GCS eye subscore is 4. GCS verbal subscore is 5. GCS motor subscore is 6.     Cranial Nerves: No cranial nerve deficit.     Sensory: No sensory deficit.     Motor: No abnormal muscle tone.     Coordination: Coordination normal.     Gait: Gait normal.     Deep Tendon Reflexes: Reflexes are normal and symmetric. Reflexes normal.  Psychiatric:        Mood and Affect: Mood normal.        Speech: Speech normal.        Behavior: Behavior normal.        Thought Content: Thought content normal.        Judgment: Judgment normal.      Depression Screen PHQ 2/9 Scores 04/22/2019  PHQ - 2 Score 1  PHQ- 9 Score 3      Assessment & Plan:     Routine Health Maintenance and Physical Exam  Exercise Activities and Dietary recommendations Goals   None      There is no immunization history on file for this patient.  Health Maintenance  Topic Date Due  . HIV Screening  08/14/1991  . TETANUS/TDAP  08/14/1995  . INFLUENZA VACCINE  10/27/2018     Discussed health benefits of physical activity, and encouraged him to engage in regular exercise appropriate for his age and condition.     --------------------------------------------------------------------  1. Encounter for annual physical exam Discussed weight loss, healthy activity and increased activity/ healthy diet recommended.  - POCT urinalysis dipstick  2. Bilateral wrist pain Deferred x rays. Positive Tinels test - will send to Dr. Amedeo Plenty for likely carpal tunnel evaluation.  - CBC with Differential/Platelet - Comprehensive Metabolic Panel (CMET)  Reason for Referral: wrist pain numbness tingling 3 digits bilateral hands  Referral discussed with patient: yes Best contact number of patient for referral team: on file  Has patient been seen by a specialist for this issue before: no . If so, who (practice/provider): n/a1 . Does the patient wish to return: n/a Patient provider preference for referral: patient prefers to be seen/ recommended trying splints first but do think it is acceptable to be seen by orthopedics given history and risk factors.  Patient location preference for referral: [place]    3. Paresthesia of both hands Referred to [facility]  - CBC with Differential/Platelet - Comprehensive Metabolic Panel (CMET) - Ambulatory referral to Orthopedic Surgery  4. RUQ abdominal pain- history of gallbladder issues reported. no pain today  He reports that he has a history of gallbladder issues, that was diagnosed in the ER in [place], he will send these records.  Advised we  will need to do a right upper quadrant ultrasound and possibly further work-up if he has not had this done already.  Report any new or changing symptoms, red flag symptoms discussed and when to head to the emergency room immediately. - CBC with Differential/Platelet - Comprehensive Metabolic Panel (CMET) - Lipase - Amylase  5. Screening cholesterol level Screening lab ordered.  Risk factors obesity and unhealthy diet lifestyle. - Lipid panel  6. Screening for thyroid disorder Obesity.  Screen for thyroid disease. -  TSH  7. Fatigue, unspecified type We will screen for thyroid and anemia.  8. Use of cannabis oil Recommend not using cannabis oil.   9. Class 3 severe obesity due to excess calories with body mass index (BMI) of 45.0 to 49.9 in adult, unspecified whether serious comorbidity present (HCC) Recommend weight loss and exercise.  Healthy diet and lifestyle aggressive changes.  10. Anxiety Recommend counseling.  GAD score is low today, he attributes this to cannabis oral.  He is aware he can follow-up with me should anxiety worsen.  We discussed medications that could be used.  No benzodiazepines to be given.  11. History of chest pain Not thought to be cardiac in nature at the emergency department however he does have history of family history in his father of MI at 31, he does report that his father was a heavy smoker and very stressed. He is advised that I am happy to place a referral to cardiology today for evaluation, patient declines this at this time and would like to wait and follow-up with again within a month.  He is advised of red flag symptoms of cardiac disease and when to seek emergency medical care immediately.  Patient verbalized understanding.  12. Family history of coronary artery disease Referral to cardiology discussed with patient, he would like to hold off and return to the office within a month for follow-up, red flags were discussed and he is aware of when to seek emergency care.   Patient verbalized understanding of all instructions given and denies any further questions at this time.     Advised patient call the office or your primary care doctor for an appointment if no improvement within 72 hours or if any symptoms change or worsen at any time  Advised ER or urgent Care if after hours or on weekend. Call 911 for emergency symptoms at any time.Patinet verbalized understanding of all instructions given/reviewed and treatment plan and has no further questions or concerns  at this time.    The entirety of the information documented in the History of Present Illness, Review of Systems and Physical Exam were personally obtained by me. Portions of this information were initially documented by the  Certified Medical Assistant whose name is documented in Epic and reviewed by me for thoroughness and accuracy.  I have personally performed the exam and reviewed the chart and it is accurate to the best of my knowledge.  Museum/gallery conservator has been used and any errors in dictation or transcription are unintentional.  Eula Fried. Flinchum FNP-C  [facility]

## 2019-04-23 ENCOUNTER — Telehealth: Payer: Self-pay

## 2019-04-23 LAB — CBC WITH DIFFERENTIAL/PLATELET
Basophils Absolute: 0 10*3/uL (ref 0.0–0.2)
Basos: 1 %
EOS (ABSOLUTE): 0.1 10*3/uL (ref 0.0–0.4)
Eos: 1 %
Hematocrit: 43.6 % (ref 37.5–51.0)
Hemoglobin: 14.8 g/dL (ref 13.0–17.7)
Immature Grans (Abs): 0 10*3/uL (ref 0.0–0.1)
Immature Granulocytes: 0 %
Lymphocytes Absolute: 1.8 10*3/uL (ref 0.7–3.1)
Lymphs: 25 %
MCH: 28.8 pg (ref 26.6–33.0)
MCHC: 33.9 g/dL (ref 31.5–35.7)
MCV: 85 fL (ref 79–97)
Monocytes Absolute: 0.5 10*3/uL (ref 0.1–0.9)
Monocytes: 8 %
Neutrophils Absolute: 4.7 10*3/uL (ref 1.4–7.0)
Neutrophils: 65 %
Platelets: 281 10*3/uL (ref 150–450)
RBC: 5.13 x10E6/uL (ref 4.14–5.80)
RDW: 12.7 % (ref 11.6–15.4)
WBC: 7.1 10*3/uL (ref 3.4–10.8)

## 2019-04-23 LAB — TSH: TSH: 2.86 u[IU]/mL (ref 0.450–4.500)

## 2019-04-23 LAB — COMPREHENSIVE METABOLIC PANEL
ALT: 42 IU/L (ref 0–44)
AST: 29 IU/L (ref 0–40)
Albumin/Globulin Ratio: 1.3 (ref 1.2–2.2)
Albumin: 4.1 g/dL (ref 4.0–5.0)
Alkaline Phosphatase: 62 IU/L (ref 39–117)
BUN/Creatinine Ratio: 13 (ref 9–20)
BUN: 11 mg/dL (ref 6–24)
Bilirubin Total: 0.7 mg/dL (ref 0.0–1.2)
CO2: 24 mmol/L (ref 20–29)
Calcium: 9 mg/dL (ref 8.7–10.2)
Chloride: 101 mmol/L (ref 96–106)
Creatinine, Ser: 0.87 mg/dL (ref 0.76–1.27)
GFR calc Af Amer: 123 mL/min/{1.73_m2} (ref >59)
GFR calc non Af Amer: 106 mL/min/{1.73_m2} (ref >59)
Globulin, Total: 3.2 g/dL (ref 1.5–4.5)
Glucose: 95 mg/dL (ref 65–99)
Potassium: 4.3 mmol/L (ref 3.5–5.2)
Sodium: 140 mmol/L (ref 134–144)
Total Protein: 7.3 g/dL (ref 6.0–8.5)

## 2019-04-23 LAB — AMYLASE: Amylase: 35 U/L (ref 31–110)

## 2019-04-23 LAB — LIPID PANEL
Chol/HDL Ratio: 4.6 ratio (ref 0.0–5.0)
Cholesterol, Total: 138 mg/dL (ref 100–199)
HDL: 30 mg/dL — ABNORMAL LOW (ref >39)
LDL Chol Calc (NIH): 73 mg/dL (ref 0–99)
Triglycerides: 211 mg/dL — ABNORMAL HIGH (ref 0–149)
VLDL Cholesterol Cal: 35 mg/dL (ref 5–40)

## 2019-04-23 LAB — LIPASE: Lipase: 34 U/L (ref 13–78)

## 2019-04-23 NOTE — Progress Notes (Signed)
Sent to Mychart :CBC is normal no anemia, or signs of infection. CMP glucose is normal, kidney and liver function is normal. TSH is normal. Triglycerides are elevated, avoiding breads, high triglyceride foods, wine and adding omega 3 foods to the diet. Lipase and Amylase enzymes are normal.

## 2019-04-23 NOTE — Telephone Encounter (Signed)
Patient has been advised. KW 

## 2019-04-23 NOTE — Telephone Encounter (Signed)
-----   Message from Berniece Pap, FNP sent at 04/23/2019  8:10 AM EST ----- Sent to Mychart :CBC is normal no anemia, or signs of infection. CMP glucose is normal, kidney and liver function is normal. TSH is normal. Triglycerides are elevated, avoiding breads, high triglyceride foods, wine and adding omega 3 foods to the diet. Lipase and Amylase enzymes are normal.

## 2019-05-09 DIAGNOSIS — Z20822 Contact with and (suspected) exposure to covid-19: Secondary | ICD-10-CM | POA: Diagnosis not present

## 2019-05-20 DIAGNOSIS — R030 Elevated blood-pressure reading, without diagnosis of hypertension: Secondary | ICD-10-CM | POA: Diagnosis not present

## 2019-05-23 ENCOUNTER — Ambulatory Visit: Payer: BC Managed Care – PPO | Admitting: Adult Health

## 2019-05-23 ENCOUNTER — Encounter: Payer: Self-pay | Admitting: Adult Health

## 2019-05-23 ENCOUNTER — Other Ambulatory Visit: Payer: Self-pay

## 2019-05-23 VITALS — BP 140/92 | HR 80 | Temp 97.5°F | Resp 18 | Wt 353.0 lb

## 2019-05-23 DIAGNOSIS — E781 Pure hyperglyceridemia: Secondary | ICD-10-CM | POA: Diagnosis not present

## 2019-05-23 DIAGNOSIS — Z6841 Body Mass Index (BMI) 40.0 and over, adult: Secondary | ICD-10-CM

## 2019-05-23 DIAGNOSIS — G4730 Sleep apnea, unspecified: Secondary | ICD-10-CM

## 2019-05-23 DIAGNOSIS — Z8719 Personal history of other diseases of the digestive system: Secondary | ICD-10-CM

## 2019-05-23 DIAGNOSIS — I10 Essential (primary) hypertension: Secondary | ICD-10-CM | POA: Diagnosis not present

## 2019-05-23 MED ORDER — HYDROCHLOROTHIAZIDE 25 MG PO TABS: 25.0000 mg | ORAL_TABLET | Freq: Every day | ORAL | 0 refills | Status: DC

## 2019-05-23 NOTE — Progress Notes (Addendum)
Patient: Steve [name] Male    DOB: 10/23/1975   44 y.o.   MRN: [id number] Visit Date: 05/23/2019  Today's Provider: Marcille Buffy, FNP   Chief Complaint  Patient presents with  . Chest Pain    follow up   Subjective:     HPI  Follow up for Chest pain:  The patient was last seen for this 1 months ago. Changes made at last visit include none; patient wanted to wait on Cardiology referral and follow up here in 1 month.  He feels that condition is Improved. He is not having any shortness of breath, any radiation of pain, dizziness, lightheadedness, or any syncope, diaphoreses.  He still occasionally has what he described as a " muscle cramping type that radiates at times to his left arm".  He denies any heavy lifting or straining. He does not have any appreciable shoulder or neck/ back discomfort. He denies any injury. Pain is not reproducible.    He has not had any RUQ pain. He has had some mild nausea after eating. He had a CT from a [place] [facility] with Cholelithiasis at that time.    He has appointment with Dr. Amedeo Plenty in one month for carpal tunnel that is still the same as last visit.   He has been having elevated blood pressure. Mild headache.. Denies any swelling in hands and feet.   Patient  denies any fever, body aches,chills, rash,  shortness of breath, vomiting, or diarrhea.  No chest pain currently but does reports chest pain intermittently as stated above.  He is very anxious at times.  ------------------------------------------------------------------------------------  No Known Allergies   Current Outpatient Medications:  .  LORazepam (ATIVAN) 1 MG tablet, Take 1 tablet (1 mg total) by mouth every 8 (eight) hours as needed for anxiety. (Patient not taking: Reported on 04/22/2019), Disp: 15 tablet, Rfl: 0  Review of Systems  Constitutional: Negative for appetite change, chills and fever.  Respiratory: Negative for chest tightness,  shortness of breath and wheezing.   Cardiovascular: Positive for chest pain. Negative for palpitations.  Gastrointestinal: Negative for abdominal pain, nausea and vomiting.  Musculoskeletal: Positive for myalgias (muscle aches in left arm).  Neurological: Positive for numbness (in hands) and headaches (x 2 weeks).    Social History   Tobacco Use  . Smoking status: Former Smoker    Quit date: 09/24/2018    Years since quitting: 0.6  . Smokeless tobacco: Former Systems developer    Types: Snuff  Substance Use Topics  . Alcohol use: Yes    Alcohol/week: 2.0 - 3.0 standard drinks    Types: 2 - 3 Cans of beer per week      Objective:   BP (!) 140/92 (BP Location: Right Wrist, Patient Position: Sitting, Cuff Size: Large)   Pulse 80   Temp (!) 97.5 F (36.4 C) (Temporal)   Resp 18   Wt (!) 353 lb (160.1 kg)   SpO2 98% Comment: room air  BMI 49.93 kg/m  Vitals:   05/23/19 1358 05/23/19 1402  BP: (!) 142/92 (!) 140/92  Pulse: 80   Resp: 18   Temp: (!) 97.5 F (36.4 C)   TempSrc: Temporal   SpO2: 98%   Weight: (!) 353 lb (160.1 kg)   Body mass index is 49.93 kg/m.  Depression screen [facility] 2/9 04/22/2019  Decreased Interest 0  Down, Depressed, Hopeless 1  PHQ - 2 Score 1  Altered sleeping 1  Tired, decreased  energy 1  Change in appetite 0  Feeling bad or failure about yourself  0  Trouble concentrating 0  Moving slowly or fidgety/restless 0  Suicidal thoughts 0  PHQ-9 Score 3  Difficult doing work/chores Not difficult at all   GAD 7 : Generalized Anxiety Score 04/22/2019  Nervous, Anxious, on Edge 1  Control/stop worrying 0  Worry too much - different things 1  Trouble relaxing 0  Restless 0  Easily annoyed or irritable 1  Afraid - awful might happen 1  Total GAD 7 Score 4  Anxiety Difficulty Not difficult at all      Physical Exam Vitals and nursing note reviewed.  Constitutional:      General: He is not in acute distress.    Appearance: Normal appearance. He is  well-developed. He is obese. He is not ill-appearing, toxic-appearing or diaphoretic.     Comments: Patient is alert and oriented and responsive to questions Engages in eye contact with provider. Speaks in full sentences without any pauses without any shortness of breath or distress.    HENT:     Head: Normocephalic and atraumatic.     Right Ear: Hearing, tympanic membrane, ear canal and external ear normal.     Left Ear: Hearing, tympanic membrane, ear canal and external ear normal.     Nose: Nose normal.     Mouth/Throat:     Mouth: Mucous membranes are moist.     Pharynx: Oropharynx is clear. Uvula midline. No oropharyngeal exudate or posterior oropharyngeal erythema.  Eyes:     General: Lids are normal. No scleral icterus.       Right eye: No discharge.        Left eye: No discharge.     Conjunctiva/sclera: Conjunctivae normal.     Pupils: Pupils are equal, round, and reactive to light.  Neck:     Thyroid: No thyromegaly.     Vascular: Normal carotid pulses. No carotid bruit, hepatojugular reflux or JVD.     Trachea: Trachea and phonation normal. No tracheal tenderness or tracheal deviation.     Meningeal: Brudzinski's sign absent.  Cardiovascular:     Rate and Rhythm: Normal rate and regular rhythm.     Pulses:          Dorsalis pedis pulses are 1+ on the right side and 1+ on the left side.       Posterior tibial pulses are 1+ on the right side and 1+ on the left side.     Heart sounds: Normal heart sounds, S1 normal and S2 normal. Heart sounds not distant. No murmur. No friction rub. No gallop.   Pulmonary:     Effort: Pulmonary effort is normal. No accessory muscle usage or respiratory distress.     Breath sounds: Normal breath sounds. No stridor. No decreased breath sounds, wheezing, rhonchi or rales.  Chest:     Chest wall: No mass, deformity, tenderness, crepitus or edema. There is no dullness to percussion.  Abdominal:     General: Bowel sounds are normal. There is no  distension.     Palpations: Abdomen is soft. There is no mass.     Tenderness: There is no abdominal tenderness. There is no right CVA tenderness, left CVA tenderness, guarding or rebound. Negative signs include Murphy's sign, Rovsing's sign, McBurney's sign, psoas sign and obturator sign.     Hernia: No hernia is present.     Comments: Normal abdominal exam- difficult with abdominal obesity.   Genitourinary:  Comments: deferred denies any concerns.  Musculoskeletal:        General: No tenderness or deformity. Normal range of motion.     Cervical back: Full passive range of motion without pain, normal range of motion and neck supple.     Right lower leg: No tenderness. Edema (trace bilateral ) present.     Left lower leg: No tenderness. Edema present.     Comments: Patient moves on and off of exam table and in room without difficulty. Gait is normal in hall and in room. Patient is oriented to person place time and situation. Patient answers questions appropriately and engages in conversation.   Lymphadenopathy:     Head:     Right side of head: No submental, submandibular, tonsillar, preauricular, posterior auricular or occipital adenopathy.     Left side of head: No submental, submandibular, tonsillar, preauricular, posterior auricular or occipital adenopathy.     Cervical: No cervical adenopathy.  Skin:    General: Skin is warm and dry.     Capillary Refill: Capillary refill takes less than 2 seconds.     Coloration: Skin is ashen. Skin is not cyanotic, jaundiced or pale.     Findings: No bruising, ecchymosis, erythema, lesion or rash.     Nails: There is no clubbing.          Comments: Lower extremity skin is dry thickened skin and ashen as well as feet bilaterally.  No cyanosis.   Neurological:     General: No focal deficit present.     Mental Status: He is alert and oriented to person, place, and time.     GCS: GCS eye subscore is 4. GCS verbal subscore is 5. GCS motor subscore  is 6.     Cranial Nerves: No cranial nerve deficit.     Sensory: No sensory deficit.     Motor: No abnormal muscle tone.     Coordination: Coordination normal.     Gait: Gait normal.     Deep Tendon Reflexes: Reflexes are normal and symmetric. Reflexes normal.  Psychiatric:        Attention and Perception: Attention and perception normal.        Mood and Affect: Affect normal. Mood is anxious (only at times ).        Speech: Speech normal.        Behavior: Behavior normal.        Thought Content: Thought content normal.        Judgment: Judgment normal.    PHQ9 SCORE ONLY 04/22/2019  Score 3   GAD 7 : Generalized Anxiety Score 04/22/2019  Nervous, Anxious, on Edge 1  Control/stop worrying 0  Worry too much - different things 1  Trouble relaxing 0  Restless 0  Easily annoyed or irritable 1  Afraid - awful might happen 1  Total GAD 7 Score 4  Anxiety Difficulty Not difficult at all       No results found for any visits on 05/23/19.     Assessment & Plan    Meds ordered this encounter  Medications  . hydrochlorothiazide (HYDRODIURIL) 25 MG tablet    Sig: Take 1 tablet (25 mg total) by mouth daily.    Dispense:  90 tablet    Refill:  0    High triglycerides - Plan: Ambulatory referral to Cardiology  Class 3 severe obesity due to excess calories with body mass index (BMI) of 45.0 to 49.9 in adult, unspecified whether serious comorbidity present (  HCC), Chronic - Plan: Ambulatory referral to Cardiology  Hypertension, unspecified type - Plan: EKG 12-Lead, Ambulatory referral to Cardiology  History of cholelithiasis - Plan: US Abdomen Limited RUQ, Ambulatory referral to Cardiology  Sleep apnea, unspecified type - Plan: Ambulatory referral to Cardiology  Body mass index (BMI) of 45.0-49.9 in adult [facility])  Orders Placed This Encounter  Procedures  . US Abdomen Limited RUQ    Standing Status:   Future    Standing Expiration Date:   06/06/2019    Order Specific  Question:   Reason for Exam (SYMPTOM  OR DIAGNOSIS REQUIRED)    Answer:   nausea after eating intermittently, pain in right upper quadrant intermittenly.    Order Specific Question:   Preferred imaging location?    Answer:   [facility]  . Ambulatory referral to Cardiology    Referral Priority:   Urgent    Referral Type:   Consultation    Referral Reason:   Specialty Services Required    Requested Specialty:   Cardiology    Number of Visits Requested:   1  . EKG 12-Lead    He has follow up with Dr. Amanda Pea for his bilateral carpal tunnel.     Advised patient call the office or your primary care doctor for an appointment if no improvement within 72 hours or if any symptoms change or worsen at any time  Advised ER or urgent Care if after hours or on weekend. Call 911 for emergency symptoms at any time.Patinet verbalized understanding of all instructions given/reviewed and treatment plan and has no further questions or concerns at this time.      The entirety of the information documented in the History of Present Illness, Review of Systems and Physical Exam were personally obtained by me. Portions of this information were initially documented by the  Certified Medical Assistant whose name is documented in Epic and reviewed by me for thoroughness and accuracy.  I have personally performed the exam and reviewed the chart and it is accurate to the best of my knowledge.  Museum/gallery conservator has been used and any errors in dictation or transcription are unintentional.  Eula Fried. Flinchum FNP-C  [facility]   Jairo Ben, FNP  [facility]

## 2019-05-23 NOTE — Patient Instructions (Addendum)
Hydrochlorothiazide, HCTZ Oral Capsules or Tablets What is this medicine? HYDROCHLOROTHIAZIDE (hye droe klor oh THYE a zide) is a diuretic. It helps you make more urine and to lose salt and excess water from your body. It treats swelling from heart, kidney, or liver disease. It also treats high blood pressure. This medicine may be used for other purposes; ask your health care provider or pharmacist if you have questions. COMMON BRAND NAME(S): Esidrix, Ezide, HydroDIURIL, Microzide, Oretic, Zide What should I tell my health care provider before I take this medicine? They need to know if you have any of these conditions:  diabetes  gout  immune system problems, like lupus  kidney disease or kidney stones  liver disease  pancreatitis  small amount of urine or difficulty passing urine  an unusual or allergic reaction to hydrochlorothiazide, sulfa drugs, other medicines, foods, dyes, or preservatives  pregnant or trying to get pregnant  breast-feeding How should I use this medicine? Take this drug by mouth. Take it as directed on the prescription label at the same time every day. You can take it with or without food. If it upsets your stomach, take it with food. Keep taking it unless your health care provider tells you to stop. Talk to your health care provider about the use of this drug in children. While it may be prescribed for children as young as newborns for selected conditions, precautions do apply. Overdosage: If you think you have taken too much of this medicine contact a poison control center or emergency room at once. NOTE: This medicine is only for you. Do not share this medicine with others. What if I miss a dose? If you miss a dose, take it as soon as you can. If it is almost time for your next dose, take only that dose. Do not take double or extra doses. What may interact with this  medicine?  cholestyramine  colestipol  digoxin  dofetilide  lithium  medicines for blood pressure  medicines for diabetes  medicines that relax muscles for surgery  other diuretics  steroid medicines like prednisone or cortisone This list may not describe all possible interactions. Give your health care provider a list of all the medicines, herbs, non-prescription drugs, or dietary supplements you use. Also tell them if you smoke, drink alcohol, or use illegal drugs. Some items may interact with your medicine. What should I watch for while using this medicine? Visit your doctor or health care professional for regular checks on your progress. Check your blood pressure as directed. Ask your doctor or health care professional what your blood pressure should be and when you should contact him or her. Talk to your health care professional about your risk of skin cancer. You may be more at risk for skin cancer if you take this medicine. This medicine can make you more sensitive to the sun. Keep out of the sun. If you cannot avoid being in the sun, wear protective clothing and use sunscreen. Do not use sun lamps or tanning beds/booths. You may need to be on a special diet while taking this medicine. Ask your doctor. Check with your doctor or health care professional if you get an attack of severe diarrhea, nausea and vomiting, or if you sweat a lot. The loss of too much body fluid can make it dangerous for you to take this medicine. You may get drowsy or dizzy. Do not drive, use machinery, or do anything that needs mental alertness until you know how  this medicine affects you. Do not stand or sit up quickly, especially if you are an older patient. This reduces the risk of dizzy or fainting spells. Alcohol may interfere with the effect of this medicine. Avoid alcoholic drinks. This medicine may increase blood sugar. Ask your healthcare provider if changes in diet or medicines are needed if you  have diabetes. What side effects may I notice from receiving this medicine? Side effects that you should report to your doctor or health care professional as soon as possible:  allergic reactions such as skin rash or itching, hives, swelling of the lips, mouth, tongue, or throat  changes in vision  chest pain  eye pain  fast or irregular heartbeat  feeling faint or lightheaded, falls  gout attack  muscle pain or cramps  pain or difficulty when passing urine  pain, tingling, numbness in the hands or feet  redness, blistering, peeling or loosening of the skin, including inside the mouth   signs and symptoms of high blood sugar such as being more thirsty or hungry or having to urinate more than normal. You may also feel very tired or have blurry vision.  unusually weak Side effects that usually do not require medical attention (report to your doctor or health care professional if they continue or are bothersome):  change in sex drive or performance  dry mouth  headache  stomach upset This list may not describe all possible side effects. Call your doctor for medical advice about side effects. You may report side effects to FDA at 1-800-FDA-1088. Where should I keep my medicine? Keep out of the reach of children and pets. Store at room temperature between 20 and 25 degrees C (68 and 77 degrees F). Protect from light and moisture. Keep the container tightly closed. Do not freeze. Throw away any unused drug after the expiration date. NOTE: This sheet is a summary. It may not cover all possible information. If you have questions about this medicine, talk to your doctor, pharmacist, or health care provider.  2020 Elsevier/Gold Standard (2018-11-15 16:52:59)  Hypertension, Adult Hypertension is another name for high blood pressure. High blood pressure forces your heart to work harder to pump blood. This can cause problems over time. There are two numbers in a blood pressure  reading. There is a top number (systolic) over a bottom number (diastolic). It is best to have a blood pressure that is below 120/80. Healthy choices can help lower your blood pressure, or you may need medicine to help lower it. What are the causes? The cause of this condition is not known. Some conditions may be related to high blood pressure. What increases the risk?  Smoking.  Having type 2 diabetes mellitus, high cholesterol, or both.  Not getting enough exercise or physical activity.  Being overweight.  Having too much fat, sugar, calories, or salt (sodium) in your diet.  Drinking too much alcohol.  Having long-term (chronic) kidney disease.  Having a family history of high blood pressure.  Age. Risk increases with age.  Race. You may be at higher risk if you are African American.  Gender. Men are at higher risk than women before age 52. After age 93, women are at higher risk than men.  Having obstructive sleep apnea.  Stress. What are the signs or symptoms?  High blood pressure may not cause symptoms. Very high blood pressure (hypertensive crisis) may cause: ? Headache. ? Feelings of worry or nervousness (anxiety). ? Shortness of breath. ? Nosebleed. ? A  feeling of being sick to your stomach (nausea). ? Throwing up (vomiting). ? Changes in how you see. ? Very bad chest pain. ? Seizures. How is this treated?  This condition is treated by making healthy lifestyle changes, such as: ? Eating healthy foods. ? Exercising more. ? Drinking less alcohol.  Your health care provider may prescribe medicine if lifestyle changes are not enough to get your blood pressure under control, and if: ? Your top number is above 130. ? Your bottom number is above 80.  Your personal target blood pressure may vary. Follow these instructions at home: Eating and drinking   If told, follow the DASH eating plan. To follow this plan: ? Fill one half of your plate at each meal with  fruits and vegetables. ? Fill one fourth of your plate at each meal with whole grains. Whole grains include whole-wheat pasta, brown rice, and whole-grain bread. ? Eat or drink low-fat dairy products, such as skim milk or low-fat yogurt. ? Fill one fourth of your plate at each meal with low-fat (lean) proteins. Low-fat proteins include fish, chicken without skin, eggs, beans, and tofu. ? Avoid fatty meat, cured and processed meat, or chicken with skin. ? Avoid pre-made or processed food.  Eat less than 1,500 mg of salt each day.  Do not drink alcohol if: ? Your doctor tells you not to drink. ? You are pregnant, may be pregnant, or are planning to become pregnant.  If you drink alcohol: ? Limit how much you use to:  0-1 drink a day for women.  0-2 drinks a day for men. ? Be aware of how much alcohol is in your drink. In the [place] one drink equals one 12 oz bottle of beer (355 mL), one 5 oz glass of wine (148 mL), or one 1 oz glass of hard liquor (44 mL). Lifestyle   Work with your doctor to stay at a healthy weight or to lose weight. Ask your doctor what the best weight is for you.  Get at least 30 minutes of exercise most days of the week. This may include walking, swimming, or biking.  Get at least 30 minutes of exercise that strengthens your muscles (resistance exercise) at least 3 days a week. This may include lifting weights or doing Pilates.  Do not use any products that contain nicotine or tobacco, such as cigarettes, e-cigarettes, and chewing tobacco. If you need help quitting, ask your doctor.  Check your blood pressure at home as told by your doctor.  Keep all follow-up visits as told by your doctor. This is important. Medicines  Take over-the-counter and prescription medicines only as told by your doctor. Follow directions carefully.  Do not skip doses of blood pressure medicine. The medicine does not work as well if you skip doses. Skipping doses also puts you at  risk for problems.  Ask your doctor about side effects or reactions to medicines that you should watch for. Contact a doctor if you:  Think you are having a reaction to the medicine you are taking.  Have headaches that keep coming back (recurring).  Feel dizzy.  Have swelling in your ankles.  Have trouble with your vision. Get help right away if you:  Get a very bad headache.  Start to feel mixed up (confused).  Feel weak or numb.  Feel faint.  Have very bad pain in your: ? Chest. ? Belly (abdomen).  Throw up more than once.  Have trouble breathing. Summary  Hypertension  is another name for high blood pressure.  High blood pressure forces your heart to work harder to pump blood.  For most people, a normal blood pressure is less than 120/80.  Making healthy choices can help lower blood pressure. If your blood pressure does not get lower with healthy choices, you may need to take medicine. This information is not intended to replace advice given to you by your health care provider. Make sure you discuss any questions you have with your health care provider. Document Revised: 11/22/2017 Document Reviewed: 11/22/2017 Elsevier Patient Education  2020 Elsevier Inc.   Gallbladder Eating Plan If you have a gallbladder condition, you may have trouble digesting fats. Eating a low-fat diet can help reduce your symptoms, and may be helpful before and after having surgery to remove your gallbladder (cholecystectomy). Your health care provider may recommend that you work with a diet and nutrition specialist (dietitian) to help you reduce the amount of fat in your diet. What are tips for following this plan? General guidelines  Limit your fat intake to less than 30% of your total daily calories. If you eat around 1,800 calories each day, this is less than 60 grams (g) of fat per day.  Fat is an important part of a healthy diet. Eating a low-fat diet can make it hard to maintain  a healthy body weight. Ask your dietitian how much fat, calories, and other nutrients you need each day.  Eat small, frequent meals throughout the day instead of three large meals.  Drink at least 8-10 cups of fluid a day. Drink enough fluid to keep your urine clear or pale yellow.  Limit alcohol intake to no more than 1 drink a day for nonpregnant women and 2 drinks a day for men. One drink equals 12 oz of beer, 5 oz of wine, or 1 oz of hard liquor. Reading food labels  Check Nutrition Facts on food labels for the amount of fat per serving. Choose foods with less than 3 grams of fat per serving. Shopping  Choose nonfat and low-fat healthy foods. Look for the words "nonfat," "low fat," or "fat free."  Avoid buying processed or prepackaged foods. Cooking  Cook using low-fat methods, such as baking, broiling, grilling, or boiling.  Cook with small amounts of healthy fats, such as olive oil, grapeseed oil, canola oil, or sunflower oil. What foods are recommended?   All fresh, frozen, or canned fruits and vegetables.  Whole grains.  Low-fat or non-fat (skim) milk and yogurt.  Lean meat, skinless poultry, fish, eggs, and beans.  Low-fat protein supplement powders or drinks.  Spices and herbs. What foods are not recommended?  High-fat foods. These include baked goods, fast food, fatty cuts of meat, ice cream, french toast, sweet rolls, pizza, cheese bread, foods covered with butter, creamy sauces, or cheese.  Fried foods. These include french fries, tempura, battered fish, breaded chicken, fried breads, and sweets.  Foods with strong odors.  Foods that cause bloating and gas. Summary  A low-fat diet can be helpful if you have a gallbladder condition, or before and after gallbladder surgery.  Limit your fat intake to less than 30% of your total daily calories. This is about 60 g of fat if you eat 1,800 calories each day.  Eat small, frequent meals throughout the day instead  of three large meals. This information is not intended to replace advice given to you by your health care provider. Make sure you discuss any questions you have  with your health care provider. Document Revised: 07/05/2018 Document Reviewed: 04/21/2016 Elsevier Patient Education  2020 [employer]. Cholelithiasis  Cholelithiasis is also called "gallstones." It is a kind of gallbladder disease. The gallbladder is an organ that stores a liquid (bile) that helps you digest fat. Gallstones may not cause symptoms (may be silent gallstones) until they cause a blockage, and then they can cause pain (gallbladder attack). Follow these instructions at home:  Take over-the-counter and prescription medicines only as told by your doctor.  Stay at a healthy weight.  Eat healthy foods. This includes: ? Eating fewer fatty foods, like fried foods. ? Eating fewer refined carbs (refined carbohydrates). Refined carbs are breads and grains that are highly processed, like white bread and white rice. Instead, choose whole grains like whole-wheat bread and brown rice. ? Eating more fiber. Almonds, fresh fruit, and beans are healthy sources of fiber.  Keep all follow-up visits as told by your doctor. This is important. Contact a doctor if:  You have sudden pain in the upper right side of your belly (abdomen). Pain might spread to your right shoulder or your chest. This may be a sign of a gallbladder attack.  You feel sick to your stomach (are nauseous).  You throw up (vomit).  You have been diagnosed with gallstones that have no symptoms and you get: ? Belly pain. ? Discomfort, burning, or fullness in the upper part of your belly (indigestion). Get help right away if:  You have sudden pain in the upper right side of your belly, and it lasts for more than 2 hours.  You have belly pain that lasts for more than 5 hours.  You have a fever or chills.  You keep feeling sick to your stomach or you keep  throwing up.  Your skin or the whites of your eyes turn yellow (jaundice).  You have dark-colored pee (urine).  You have light-colored poop (stool). Summary  Cholelithiasis is also called "gallstones."  The gallbladder is an organ that stores a liquid (bile) that helps you digest fat.  Silent gallstones are gallstones that do not cause symptoms.  A gallbladder attack may cause sudden pain in the upper right side of your belly. Pain might spread to your right shoulder or your chest. If this happens, contact your doctor.  If you have sudden pain in the upper right side of your belly that lasts for more than 2 hours, get help right away. This information is not intended to replace advice given to you by your health care provider. Make sure you discuss any questions you have with your health care provider. Document Revised: 02/24/2017 Document Reviewed: 11/29/2015 Elsevier Patient Education  2020 Elsevier Inc.  Triglycerides Test Why am I having this test? Triglycerides are a type of fat in the body. Having a high level of triglycerides can increase your risk for heart disease. You may have this test as part of a routine physical exam. Health care providers recommend that adults have this test at least once every 5 years. If you have risk factors for heart disease or are being treated for high triglycerides, you may need to have this test more often. What is being tested? This test measures the amount of triglycerides in your blood. Triglycerides are naturally present in the body, and you also take in triglycerides by eating certain foods. Triglycerides may be measured as part of a test called a lipid profile, which tests triglycerides and cholesterol levels. What kind of sample is taken?  A blood sample is required for this test. It may be collected by inserting a needle into a blood vessel, or by pricking a fingertip with a small needle (finger stick). How do I prepare for this  test?  Follow instructions from your health care provider about changing or stopping your regular medicines.  Do not eat or drink anything except water starting 9-12 hours before your test, or as long as told by your health care provider.  Do not drink alcohol starting at least 24 hours before your test.  Follow any instructions from your health care provider about dietary restrictions before your test. Tell a health care provider about:  All medicines you are taking, including vitamins, herbs, eye drops, creams, and over-the-counter medicines.  Any blood disorders you have.  Any medical conditions you have. How are the results reported? Your test results will be reported as a value that indicates how many triglycerides are in your blood. This will be given as milligrams of triglycerides per deciliter of blood (mg/dL). Your health care provider will compare your results to normal values that were established after testing a large group of people (reference ranges). Reference ranges may vary among labs and hospitals. For this test, common reference ranges are:  Adults: ? Male: 40-160 mg/dL or 1.00-7.12 mmol/L (SI units). ? Male: 35-135 mg/dL or 1.97-5.88 mmol/L (SI units).  Teens 34-68 years old: ? Male: 40-163 mg/dL. ? Male: 40-128 mg/dL.  Children 66-41 years old: ? Male: 36-138 mg/dL. ? Male: 41-138 mg/dL.  Children 19-28 years old: ? Male: 31-108 mg/dL. ? Male: 35-114 mg/dL.  Children 5 years or younger: ? Male: 30-86 mg/dL. ? Male: 32-99 mg/dL. What do the results mean? Results that are within the reference range are considered normal. This means that you have a normal amount of triglycerides in your blood. Results that are higher than your reference range mean that there are too many triglycerides in your blood. This may mean that you:  Have a higher risk of heart disease.  Have certain diseases that cause high triglycerides, such as diabetes.  Are taking  certain medicines such as estrogens and oral contraceptives. Results that are lower than your reference range mean that there are too few triglycerides in your blood. This may mean that you are not getting enough nutrients in your diet (malnutrition). Talk with your health care provider about what your results mean. Questions to ask your health care provider Ask your health care provider, or the department that is doing the test:  When will my results be ready?  How will I get my results?  What are my treatment options?  What other tests do I need?  What are my next steps? Summary  Triglycerides are a type of fat in the body. Having a high level of triglycerides can increase your risk for heart disease.  You may have this test as part of a routine physical exam. Triglycerides may be measured as part of a test called a lipid profile, which tests triglycerides and cholesterol.  Talk with your health care provider about what your results mean. This information is not intended to replace advice given to you by your health care provider. Make sure you discuss any questions you have with your health care provider. Document Revised: 06/13/2017 Document Reviewed: 12/13/2016 Elsevier Patient Education  2020 Elsevier Inc.   Calorie Counting for [employer] Calories are units of energy. Your body needs a certain amount of calories from food to keep you going  throughout the day. When you eat more calories than your body needs, your body stores the extra calories as fat. When you eat fewer calories than your body needs, your body burns fat to get the energy it needs. Calorie counting means keeping track of how many calories you eat and drink each day. Calorie counting can be helpful if you need to lose weight. If you make sure to eat fewer calories than your body needs, you should lose weight. Ask your health care provider what a healthy weight is for you. For calorie counting to work, you will  need to eat the right number of calories in a day in order to lose a healthy amount of weight per week. A dietitian can help you determine how many calories you need in a day and will give you suggestions on how to reach your calorie goal.  A healthy amount of weight to lose per week is usually 1-2 lb (0.5-0.9 kg). This usually means that your daily calorie intake should be reduced by 500-750 calories.  Eating 1,200 - 1,500 calories per day can help most women lose weight.  Eating 1,500 - 1,800 calories per day can help most men lose weight. What is my plan? My goal is to have __________ calories per day. If I have this many calories per day, I should lose around __________ pounds per week. What do I need to know about calorie counting? In order to meet your daily calorie goal, you will need to:  Find out how many calories are in each food you would like to eat. Try to do this before you eat.  Decide how much of the food you plan to eat.  Write down what you ate and how many calories it had. Doing this is called keeping a food log. To successfully lose weight, it is important to balance calorie counting with a healthy lifestyle that includes regular activity. Aim for 150 minutes of moderate exercise (such as walking) or 75 minutes of vigorous exercise (such as running) each week. Where do I find calorie information?  The number of calories in a food can be found on a Nutrition Facts label. If a food does not have a Nutrition Facts label, try to look up the calories online or ask your dietitian for help. Remember that calories are listed per serving. If you choose to have more than one serving of a food, you will have to multiply the calories per serving by the amount of servings you plan to eat. For example, the label on a package of bread might say that a serving size is 1 slice and that there are 90 calories in a serving. If you eat 1 slice, you will have eaten 90 calories. If you eat 2  slices, you will have eaten 180 calories. How do I keep a food log? Immediately after each meal, record the following information in your food log:  What you ate. Don't forget to include toppings, sauces, and other extras on the food.  How much you ate. This can be measured in cups, ounces, or number of items.  How many calories each food and drink had.  The total number of calories in the meal. Keep your food log near you, such as in a small notebook in your pocket, or use a mobile app or website. Some programs will calculate calories for you and show you how many calories you have left for the day to meet your goal. What are some calorie  counting tips?   Use your calories on foods and drinks that will fill you up and not leave you hungry: ? Some examples of foods that fill you up are nuts and nut butters, vegetables, lean proteins, and high-fiber foods like whole grains. High-fiber foods are foods with more than 5 g fiber per serving. ? Drinks such as sodas, specialty coffee drinks, alcohol, and juices have a lot of calories, yet do not fill you up.  Eat nutritious foods and avoid empty calories. Empty calories are calories you get from foods or beverages that do not have many vitamins or protein, such as candy, sweets, and soda. It is better to have a nutritious high-calorie food (such as an avocado) than a food with few nutrients (such as a bag of chips).  Know how many calories are in the foods you eat most often. This will help you calculate calorie counts faster.  Pay attention to calories in drinks. Low-calorie drinks include water and unsweetened drinks.  Pay attention to nutrition labels for "low fat" or "fat free" foods. These foods sometimes have the same amount of calories or more calories than the full fat versions. They also often have added sugar, starch, or salt, to make up for flavor that was removed with the fat.  Find a way of tracking calories that works for you. Get  creative. Try different apps or programs if writing down calories does not work for you. What are some portion control tips?  Know how many calories are in a serving. This will help you know how many servings of a certain food you can have.  Use a measuring cup to measure serving sizes. You could also try weighing out portions on a kitchen scale. With time, you will be able to estimate serving sizes for some foods.  Take some time to put servings of different foods on your favorite plates, bowls, and cups so you know what a serving looks like.  Try not to eat straight from a bag or box. Doing this can lead to overeating. Put the amount you would like to eat in a cup or on a plate to make sure you are eating the right portion.  Use smaller plates, glasses, and bowls to prevent overeating.  Try not to multitask (for example, watch TV or use your computer) while eating. If it is time to eat, sit down at a table and enjoy your food. This will help you to know when you are full. It will also help you to be aware of what you are eating and how much you are eating. What are tips for following this plan? Reading food labels  Check the calorie count compared to the serving size. The serving size may be smaller than what you are used to eating.  Check the source of the calories. Make sure the food you are eating is high in vitamins and protein and low in saturated and trans fats. Shopping  Read nutrition labels while you shop. This will help you make healthy decisions before you decide to purchase your food.  Make a grocery list and stick to it. Cooking  Try to cook your favorite foods in a healthier way. For example, try baking instead of frying.  Use low-fat dairy products. Meal planning  Use more fruits and vegetables. Half of your plate should be fruits and vegetables.  Include lean proteins like poultry and fish. How do I count calories when eating out?  Ask for smaller portion  sizes.  Consider sharing an entree and sides instead of getting your own entree.  If you get your own entree, eat only half. Ask for a box at the beginning of your meal and put the rest of your entree in it so you are not tempted to eat it.  If calories are listed on the menu, choose the lower calorie options.  Choose dishes that include vegetables, fruits, whole grains, low-fat dairy products, and lean protein.  Choose items that are boiled, broiled, grilled, or steamed. Stay away from items that are buttered, battered, fried, or served with cream sauce. Items labeled "crispy" are usually fried, unless stated otherwise.  Choose water, low-fat milk, unsweetened iced tea, or other drinks without added sugar. If you want an alcoholic beverage, choose a lower calorie option such as a glass of wine or light beer.  Ask for dressings, sauces, and syrups on the side. These are usually high in calories, so you should limit the amount you eat.  If you want a salad, choose a garden salad and ask for grilled meats. Avoid extra toppings like bacon, cheese, or fried items. Ask for the dressing on the side, or ask for olive oil and vinegar or lemon to use as dressing.  Estimate how many servings of a food you are given. For example, a serving of cooked rice is  cup or about the size of half a baseball. Knowing serving sizes will help you be aware of how much food you are eating at restaurants. The list below tells you how big or small some common portion sizes are based on everyday objects: ? 1 oz--4 stacked dice. ? 3 oz--1 deck of cards. ? 1 tsp--1 die. ? 1 Tbsp-- a ping-pong ball. ? 2 Tbsp--1 ping-pong ball. ?  cup-- baseball. ? 1 cup--1 baseball. Summary  Calorie counting means keeping track of how many calories you eat and drink each day. If you eat fewer calories than your body needs, you should lose weight.  A healthy amount of weight to lose per week is usually 1-2 lb (0.5-0.9 kg). This  usually means reducing your daily calorie intake by 500-750 calories.  The number of calories in a food can be found on a Nutrition Facts label. If a food does not have a Nutrition Facts label, try to look up the calories online or ask your dietitian for help.  Use your calories on foods and drinks that will fill you up, and not on foods and drinks that will leave you hungry.  Use smaller plates, glasses, and bowls to prevent overeating. This information is not intended to replace advice given to you by your health care provider. Make sure you discuss any questions you have with your health care provider. Document Revised: 12/01/2017 Document Reviewed: 02/12/2016 Elsevier Patient Education  2020 [employer].   Plan de alimentacin restringido en grasas y colesterol Fat and Cholesterol Restricted Eating Plan El exceso de grasas y colesterol en la dieta puede causar problemas de [place]. Elegir los alimentos adecuados ayuda a [employer] niveles de grasas y [place]. Esto puede evitarle contraer ciertas enfermedades. El mdico puede recomendarle un plan de alimentacin que incluya lo siguiente:  Grasas totales: ______% o menos del total de caloras por da.  Grasas saturadas: ______% o menos del total de caloras por da.  Colesterol: menos de _________mg Karie Chimera.  Fibra: ______g Karie Chimera. Cules son algunos consejos para seguir este plan? Planificacin de las [place], [place]  su plato en cuatro partes iguales: ? Llene la mitad del plato con verduras y ensaladas de hojas verdes. ? Llene un cuarto del plato con cereales integrales. ? Llene un cuarto del plato con alimentos con protenas con bajo contenido de grasas [employer](magras).  Coma pescado con alto contenido de grasas omega3 al Borders Groupmenos dos veces por semana. Esas grasas se encuentran en la caballa, el atn, las sardinas y el salmn.  Coma alimentos con [address]to contenido de [place]fib[place], como cereales Cob[place]integrales, frijoles,  New Hopemanzanas, Hunterbrcoli, Greenwoodzanahorias, guisantes y Qatarcebada. Consejos generales   Si necesita adelgazar, consulte a su mdico.  Evite lo siguiente: ? Alimentos con Engineer, miningazcar agregada. ? Comidas fritas. ? Alimentos con aceites parcialmente hidrogenados.  Limite el consumo de alcohol a no ms de 1medida por da si es mujer y no est Fifty Lakesembarazada, y 2medidas por da si es hombre. Una medida equivale a 12oz de Financial controllercerveza, 5oz de vino o 1oz de bebidas alcohlicas de alta graduacin. Lea las etiquetas de los alimentos  Lea las etiquetas de los alimentos para conocer lo siguiente: ? Si contienen grasas trans. ? Si contienen aceites parcialmente hidrogenados. ? La cantidad de grasas saturadas (g) que contiene cada porcin. ? La cantidad de colesterol (mg) que contiene cada porcin. ? La cantidad de fibra (g) que contiene cada porcin.  Elija alimentos con grasas saludables, tales como las siguientes: ? Grasas monoinsaturadas. ? Grasas poliinsaturadas. ? Grasas omega3.  Elija productos de cereal que tengan cereales integrales. Busque la palabra "integral" en Estate agentel primer lugar de la lista de ingredientes. Al cocinar  Emplee mtodos de coccin con poca cantidad de grasa. Por ejemplo, hornear, hervir, grillar y Transport plannerasar.  Coma ms comidas caseras. Coma en restaurantes y bares con menos frecuencia.  Evite cocinar usando grasas saturadas, como B[place] Heightsmanteca, crema, aceite de Weinerpalma, aceite de palmiste y aceite de Chelan Fallscoco. ShilohAlimentos recomendados  Frutas  Frutas frescas, en conserva (en su jugo natural) o frutas congeladas. Verduras  Verduras frescas o congeladas (crudas, al vapor, asadas o grilladas). Ensaladas de hojas verdes. Granos  Cereales integrales, como panes, galletas, cereales y pastas de [address] o de trigo integral. Avena sin endulzar, trigo bulgur, cebada, quinua o arroz integral. Tortillas de harina de maz o trigo integral. Carnes y otros alimentos ricos en protenas  Carne de res molida (al  85% o ms magra), carne de res de animales alimentados con pastos o carne de res sin la grasa. Pollo o pavo sin piel. Carne de pollo o de Morningsidepavo molida. Cerdo sin la grasa. Todos los pescados y frutos de mar. Claras de huevo. Porotos, guisantes o lentejas secos. Frutos secos o semillas sin sal. Frijoles enlatados sin sal. Mantequillas de frutos secos sin azcar ni aceite agregados. Lcteos  Productos lcteos descremados o semidescremados, como PPG Industriesleche descremada o al 1%, quesos reducidos en grasas o al 2%, queso cottage o ricota con bajo contenido de Salleygrasas o sin contenido de Seeley Lakegrasas, o yogur natural descremado o semidescremado. Grasas y aceites  Margarina untable que no contenga grasas trans. Mayonesa y condimentos para ensaladas livianos o reducidos en grasas. [place]. Aceites de oliva, canola, ssamo o crtamo. Es posible que los productos que se enumeran ms Seychellesarriba no constituyan una lista completa de los alimentos y las bebidas que puede tomar. Consulte a un nutricionista para obtener ms informacin. Alimentos que deben evitarse Frutas  Fruta enlatada en almbar espeso. Frutas con salsa de crema o mantequilla. Frutas cocidas en aceite. Verduras  Verduras cocinadas con salsas de queso, crema o mantequilla.  Verduras fritas. Granos  Pan blanco. Pastas blancas. Arroz blanco. Pan de maz. Bagels, pasteles y croissants. Galletas saladas y colaciones que contengan grasas trans y aceites hidrogenados. Carnes y otros alimentos ricos en protenas  Cortes de carne con alto contenido de [place]. Costillas, alas de pollo, tocineta, salchicha, mortadela, salame, chinchulines, tocino, perros calientes, salchichas alemanas y embutidos envasados. Hgado y otros rganos. Huevos enteros y yemas de [place]. Pollo y pavo con piel. Carne frita. Lcteos  Leche entera o al 2%, crema, mezcla de [place] y crema, y queso crema. Quesos enteros. Yogur entero o endulzado. Quesos con toda su grasa. Cremas no lcteas y  coberturas batidas. Quesos procesados, quesos para untar y [place]. Bebidas  Alcohol. Bebidas endulzadas con azcar, como refrescos, limonada y bebidas frutales. Grasas y [address], [place] en barra, [place] de [place], [place], [place] clarificada o grasa de tocino. Aceites de coco, de palmiste y de palma. Dulces y postres  Jarabe de maz, azcares, miel y Radio broadcast assistant. Caramelos. Mermeladas y [place]. Doreen Beam. Cereales endulzados. Galletas, pasteles, bizcochuelos, donas, muffins y helado. Es posible que los productos que se enumeran ms [place] no constituyan una lista completa de los alimentos y las bebidas que Personnel officer. Consulte a un nutricionista para obtener ms informacin. Resumen  Elegir los alimentos adecuados ayuda a Pharmacologist normales los niveles de grasas y [place]. Esto puede evitarle contraer ciertas enfermedades.  En las comidas, llene la mitad del plato con verduras y ensaladas de hojas verdes.  Coma alimentos con alto contenido de [place], como cereales [place], frijoles, [place], zanahorias, guisantes y [place].  Limite los alimentos con azcar agregada y grasas saturadas, el alcohol y las comidas fritas. Esta informacin no tiene Theme park manager el consejo del mdico. Asegrese de hacerle al mdico cualquier pregunta que tenga. Document Revised: 11/15/2017 Document Reviewed: 03/01/2017 Elsevier Patient Education  2020 Elsevier Inc.   Calorie Counting for [employer] Loss Calories are units of energy. Your body needs a certain amount of calories from food to keep you going throughout the day. When you eat more calories than your body needs, your body stores the extra calories as fat. When you eat fewer calories than your body needs, your body burns fat to get the energy it needs. Calorie counting means keeping track of how many calories you eat and drink each day. Calorie counting can be helpful if you need to lose weight. If you make sure to eat fewer  calories than your body needs, you should lose weight. Ask your health care provider what a healthy weight is for you. For calorie counting to work, you will need to eat the right number of calories in a day in order to lose a healthy amount of weight per week. A dietitian can help you determine how many calories you need in a day and will give you suggestions on how to reach your calorie goal.  A healthy amount of weight to lose per week is usually 1-2 lb (0.5-0.9 kg). This usually means that your daily calorie intake should be reduced by 500-750 calories.  Eating 1,200 - 1,500 calories per day can help most women lose weight.  Eating 1,500 - 1,800 calories per day can help most men lose weight. What is my plan? My goal is to have __________ calories per day. If I have this many calories per day, I should lose around __________ pounds per week. What do I need to know about calorie counting? In order to meet your daily calorie goal, you will  need to:  Find out how many calories are in each food you would like to eat. Try to do this before you eat.  Decide how much of the food you plan to eat.  Write down what you ate and how many calories it had. Doing this is called keeping a food log. To successfully lose weight, it is important to balance calorie counting with a healthy lifestyle that includes regular activity. Aim for 150 minutes of moderate exercise (such as walking) or 75 minutes of vigorous exercise (such as running) each week. Where do I find calorie information?  The number of calories in a food can be found on a Nutrition Facts label. If a food does not have a Nutrition Facts label, try to look up the calories online or ask your dietitian for help. Remember that calories are listed per serving. If you choose to have more than one serving of a food, you will have to multiply the calories per serving by the amount of servings you plan to eat. For example, the label on a package of  bread might say that a serving size is 1 slice and that there are 90 calories in a serving. If you eat 1 slice, you will have eaten 90 calories. If you eat 2 slices, you will have eaten 180 calories. How do I keep a food log? Immediately after each meal, record the following information in your food log:  What you ate. Don't forget to include toppings, sauces, and other extras on the food.  How much you ate. This can be measured in cups, ounces, or number of items.  How many calories each food and drink had.  The total number of calories in the meal. Keep your food log near you, such as in a small notebook in your pocket, or use a mobile app or website. Some programs will calculate calories for you and show you how many calories you have left for the day to meet your goal. What are some calorie counting tips?   Use your calories on foods and drinks that will fill you up and not leave you hungry: ? Some examples of foods that fill you up are nuts and nut butters, vegetables, lean proteins, and high-fiber foods like whole grains. High-fiber foods are foods with more than 5 g fiber per serving. ? Drinks such as sodas, specialty coffee drinks, alcohol, and juices have a lot of calories, yet do not fill you up.  Eat nutritious foods and avoid empty calories. Empty calories are calories you get from foods or beverages that do not have many vitamins or protein, such as candy, sweets, and soda. It is better to have a nutritious high-calorie food (such as an avocado) than a food with few nutrients (such as a bag of chips).  Know how many calories are in the foods you eat most often. This will help you calculate calorie counts faster.  Pay attention to calories in drinks. Low-calorie drinks include water and unsweetened drinks.  Pay attention to nutrition labels for "low fat" or "fat free" foods. These foods sometimes have the same amount of calories or more calories than the full fat versions. They  also often have added sugar, starch, or salt, to make up for flavor that was removed with the fat.  Find a way of tracking calories that works for you. Get creative. Try different apps or programs if writing down calories does not work for you. What are some portion control tips?  Know how many calories are in a serving. This will help you know how many servings of a certain food you can have.  Use a measuring cup to measure serving sizes. You could also try weighing out portions on a kitchen scale. With time, you will be able to estimate serving sizes for some foods.  Take some time to put servings of different foods on your favorite plates, bowls, and cups so you know what a serving looks like.  Try not to eat straight from a bag or box. Doing this can lead to overeating. Put the amount you would like to eat in a cup or on a plate to make sure you are eating the right portion.  Use smaller plates, glasses, and bowls to prevent overeating.  Try not to multitask (for example, watch TV or use your computer) while eating. If it is time to eat, sit down at a table and enjoy your food. This will help you to know when you are full. It will also help you to be aware of what you are eating and how much you are eating. What are tips for following this plan? Reading food labels  Check the calorie count compared to the serving size. The serving size may be smaller than what you are used to eating.  Check the source of the calories. Make sure the food you are eating is high in vitamins and protein and low in saturated and trans fats. Shopping  Read nutrition labels while you shop. This will help you make healthy decisions before you decide to purchase your food.  Make a grocery list and stick to it. Cooking  Try to cook your favorite foods in a healthier way. For example, try baking instead of frying.  Use low-fat dairy products. Meal planning  Use more fruits and vegetables. Half of your  plate should be fruits and vegetables.  Include lean proteins like poultry and fish. How do I count calories when eating out?  Ask for smaller portion sizes.  Consider sharing an entree and sides instead of getting your own entree.  If you get your own entree, eat only half. Ask for a box at the beginning of your meal and put the rest of your entree in it so you are not tempted to eat it.  If calories are listed on the menu, choose the lower calorie options.  Choose dishes that include vegetables, fruits, whole grains, low-fat dairy products, and lean protein.  Choose items that are boiled, broiled, grilled, or steamed. Stay away from items that are buttered, battered, fried, or served with cream sauce. Items labeled "crispy" are usually fried, unless stated otherwise.  Choose water, low-fat milk, unsweetened iced tea, or other drinks without added sugar. If you want an alcoholic beverage, choose a lower calorie option such as a glass of wine or light beer.  Ask for dressings, sauces, and syrups on the side. These are usually high in calories, so you should limit the amount you eat.  If you want a salad, choose a garden salad and ask for grilled meats. Avoid extra toppings like bacon, cheese, or fried items. Ask for the dressing on the side, or ask for olive oil and vinegar or lemon to use as dressing.  Estimate how many servings of a food you are given. For example, a serving of cooked rice is  cup or about the size of half a baseball. Knowing serving sizes will help you be aware of how much food  you are eating at restaurants. The list below tells you how big or small some common portion sizes are based on everyday objects: ? 1 oz--4 stacked dice. ? 3 oz--1 deck of cards. ? 1 tsp--1 die. ? 1 Tbsp-- a ping-pong ball. ? 2 Tbsp--1 ping-pong ball. ?  cup-- baseball. ? 1 cup--1 baseball. Summary  Calorie counting means keeping track of how many calories you eat and drink each day. If  you eat fewer calories than your body needs, you should lose weight.  A healthy amount of weight to lose per week is usually 1-2 lb (0.5-0.9 kg). This usually means reducing your daily calorie intake by 500-750 calories.  The number of calories in a food can be found on a Nutrition Facts label. If a food does not have a Nutrition Facts label, try to look up the calories online or ask your dietitian for help.  Use your calories on foods and drinks that will fill you up, and not on foods and drinks that will leave you hungry.  Use smaller plates, glasses, and bowls to prevent overeating. This information is not intended to replace advice given to you by your health care provider. Make sure you discuss any questions you have with your health care provider. Document Revised: 12/01/2017 Document Reviewed: 02/12/2016 Elsevier Patient Education  2020 Elsevier Inc.   Fat and Cholesterol Restricted Eating Plan Getting too much fat and cholesterol in your diet may cause health problems. Choosing the right foods helps keep your fat and cholesterol at normal levels. This can keep you from getting certain diseases. Your doctor may recommend an eating plan that includes:  Total fat: ______% or less of total calories a day.  Saturated fat: ______% or less of total calories a day.  Cholesterol: less than _________mg a day.  Fiber: ______g a day. What are tips for following this plan? Meal planning  At meals, divide your plate into four equal parts: ? Fill one-half of your plate with vegetables and green salads. ? Fill one-fourth of your plate with whole grains. ? Fill one-fourth of your plate with low-fat (lean) protein foods.  Eat fish that is high in omega-3 fats at least two times a week. This includes mackerel, tuna, sardines, and salmon.  Eat foods that are high in fiber, such as whole grains, beans, apples, broccoli, carrots, peas, and barley. General tips   Work with your doctor to  lose weight if you need to.  Avoid: ? Foods with added sugar. ? Fried foods. ? Foods with partially hydrogenated oils.  Limit alcohol intake to no more than 1 drink a day for nonpregnant women and 2 drinks a day for men. One drink equals 12 oz of beer, 5 oz of wine, or 1 oz of hard liquor. Reading food labels  Check food labels for: ? Trans fats. ? Partially hydrogenated oils. ? Saturated fat (g) in each serving. ? Cholesterol (mg) in each serving. ? Fiber (g) in each serving.  Choose foods with healthy fats, such as: ? Monounsaturated fats. ? Polyunsaturated fats. ? Omega-3 fats.  Choose grain products that have whole grains. Look for the word "whole" as the first word in the ingredient list. Cooking  Cook foods using low-fat methods. These include baking, boiling, grilling, and broiling.  Eat more home-cooked foods. Eat at restaurants and buffets less often.  Avoid cooking using saturated fats, such as butter, cream, palm oil, palm kernel oil, and coconut oil. Recommended foods  Fruits  All fresh,  canned (in natural juice), or frozen fruits. Vegetables  Fresh or frozen vegetables (raw, steamed, roasted, or grilled). Green salads. Grains  Whole grains, such as whole wheat or whole grain breads, crackers, cereals, and pasta. Unsweetened oatmeal, bulgur, barley, quinoa, or brown rice. Corn or whole wheat flour tortillas. Meats and other protein foods  Ground beef (85% or leaner), grass-fed beef, or beef trimmed of fat. Skinless chicken or [place]. Ground chicken or [place]. Pork trimmed of fat. All fish and seafood. Egg whites. Dried beans, peas, or lentils. Unsalted nuts or seeds. Unsalted canned beans. Nut butters without added sugar or oil. Dairy  Low-fat or nonfat dairy products, such as skim or 1% milk, 2% or reduced-fat cheeses, low-fat and fat-free ricotta or cottage cheese, or plain low-fat and nonfat yogurt. Fats and oils  Tub margarine without trans fats.  Light or reduced-fat mayonnaise and salad dressings. Avocado. Olive, canola, sesame, or safflower oils. The items listed above may not be a complete list of foods and beverages you can eat. Contact a dietitian for more information. Foods to avoid Fruits  Canned fruit in heavy syrup. Fruit in cream or butter sauce. Fried fruit. Vegetables  Vegetables cooked in cheese, cream, or butter sauce. Fried vegetables. Grains  White bread. White pasta. White rice. Cornbread. Bagels, pastries, and croissants. Crackers and snack foods that contain trans fat and hydrogenated oils. Meats and other protein foods  Fatty cuts of meat. Ribs, chicken wings, bacon, sausage, bologna, salami, chitterlings, fatback, hot dogs, bratwurst, and packaged lunch meats. Liver and organ meats. Whole eggs and egg yolks. Chicken and [place] with skin. Fried meat. Dairy  Whole or 2% milk, cream, half-and-half, and cream cheese. Whole milk cheeses. Whole-fat or sweetened yogurt. Full-fat cheeses. Nondairy creamers and whipped toppings. Processed cheese, cheese spreads, and cheese curds. Beverages  Alcohol. Sugar-sweetened drinks such as sodas, lemonade, and fruit drinks. Fats and oils  Butter, stick margarine, lard, shortening, ghee, or bacon fat. Coconut, palm kernel, and palm oils. Sweets and desserts  Corn syrup, sugars, honey, and molasses. Candy. Jam and jelly. Syrup. Sweetened cereals. Cookies, pies, cakes, donuts, muffins, and ice cream. The items listed above may not be a complete list of foods and beverages you should avoid. Contact a dietitian for more information. Summary  Choosing the right foods helps keep your fat and cholesterol at normal levels. This can keep you from getting certain diseases.  At meals, fill one-half of your plate with vegetables and green salads.  Eat high-fiber foods, like whole grains, beans, apples, carrots, peas, and barley.  Limit added sugar, saturated fats, alcohol, and fried  foods. This information is not intended to replace advice given to you by your health care provider. Make sure you discuss any questions you have with your health care provider. Document Revised: 11/15/2017 Document Reviewed: 11/29/2016 Elsevier Patient Education  [place].

## 2019-05-27 ENCOUNTER — Ambulatory Visit (INDEPENDENT_AMBULATORY_CARE_PROVIDER_SITE_OTHER): Payer: BC Managed Care – PPO | Admitting: Internal Medicine

## 2019-05-27 ENCOUNTER — Other Ambulatory Visit: Payer: Self-pay

## 2019-05-27 ENCOUNTER — Encounter: Payer: Self-pay | Admitting: Internal Medicine

## 2019-05-27 VITALS — BP 140/90 | HR 77 | Ht 70.5 in | Wt 343.0 lb

## 2019-05-27 DIAGNOSIS — R9431 Abnormal electrocardiogram [ECG] [EKG]: Secondary | ICD-10-CM

## 2019-05-27 DIAGNOSIS — R079 Chest pain, unspecified: Secondary | ICD-10-CM

## 2019-05-27 DIAGNOSIS — R0602 Shortness of breath: Secondary | ICD-10-CM | POA: Diagnosis not present

## 2019-05-27 DIAGNOSIS — I10 Essential (primary) hypertension: Secondary | ICD-10-CM | POA: Diagnosis not present

## 2019-05-27 NOTE — Progress Notes (Signed)
New Outpatient Visit Date: 05/27/2019  Referring Provider: Berniece Pap, FNP [address] [place],  [place] [address]  Chief Complaint: Chest pain and shortness of breath  HPI:  Steve [name] is a 44 y.o. male who is being seen today for the evaluation of chest pain and elevated triglycerides at the request of Ms. [name]. He has a history of recently diagnosed hypertension and obstructive sleep apnea.  He seen at the [facility] emergency department in 12/2018 for chest pain that he described as a muscle cramping sensation.  Symptoms gradually resolved and he initially wished to defer cardiology consultation.  He was last seen by his PCP on 05/23/2019, which time he continued to describe improved chest pain without dyspnea or other associated symptoms.  Today, Steve [name] reports that he is feeling well.  He began having episodic central chest tightness with radiation to the left arm around the onset of the COVID-19 pandemic.  This was often accompanied by shortness of breath.  He not attributes this to anxiety attacks.  The frequency and severity of these episodes has improved with relaxation and use of CBD oil.  Steve [name] reports longstanding exertional dyspnea that has worsened in the setting of the COVID-19 pandemic.  He attributes this to inactivity and weight gain.  He denies edema, orthopnea, and PND.  He has chronic skin thickening on both legs that he attributes to dry skin.  Last year, Steve [name] developed severe abdominal pain with nausea and vomiting.  He was found to have gallstones and was told that he would likely need gall bladder surgery in the near future.  He also anticipated bilateral carpal tunnel surgery sometime soon.  He denies a history of prior cardiac disease and testing, other than a recent EKG through his PCP that was abnormal (left axis deviation and poor R wave progression).  He was just started on HCTZ last week for treatment of  hypertension  --------------------------------------------------------------------------------------------------  Cardiovascular History & Procedures: Cardiovascular Problems:  Chest pain and exertional dyspnea.  Risk Factors:  Hypertension, morbid obesity, prior tobacco use, and male gender  Cath/PCI:  None  CV Surgery:  None  EP Procedures and Devices:  None  Non-Invasive Evaluation(s):  None  Recent CV Pertinent Labs: Lab Results  Component Value Date   CHOL 138 04/22/2019   HDL 30 (L) 04/22/2019   LDLCALC 73 04/22/2019   TRIG 211 (H) 04/22/2019   CHOLHDL 4.6 04/22/2019   K 4.3 04/22/2019   BUN 11 04/22/2019   CREATININE 0.87 04/22/2019    --------------------------------------------------------------------------------------------------  Past Medical History:  Diagnosis Date  . Gall bladder inflammation   . Hypertension   . Obstructive sleep apnea     Past Surgical History:  Procedure Laterality Date  . ADENOIDECTOMY      Current Meds  Medication Sig  . hydrochlorothiazide (HYDRODIURIL) 25 MG tablet Take 1 tablet (25 mg total) by mouth daily.    Allergies: Patient has no known allergies.  Social History   Tobacco Use  . Smoking status: Former Games developer  . Smokeless tobacco: Former Neurosurgeon    Types: Snuff    Quit date: 08/2018  Substance Use Topics  . Alcohol use: Yes    Alcohol/week: 2.0 - 3.0 standard drinks    Types: 2 - 3 Cans of beer per week  . Drug use: No    Family History  Problem Relation Age of Onset  . Diabetes Mother   . Diabetes Maternal Grandfather   . Heart  attack Father 53    Review of Systems: A 12-system review of systems was performed and was negative except as noted in the HPI.  --------------------------------------------------------------------------------------------------  Physical Exam: BP 140/90 (BP Location: Left Arm, Patient Position: Sitting, Cuff Size: Large)   Pulse 77   Ht 5' 10.5" (1.791 m)   Wt  (!) 343 lb (155.6 kg)   BMI 48.52 kg/m   General:  NAD. HEENT: No conjunctival pallor or scleral icterus. Facemask in place. Neck: Supple without lymphadenopathy, thyromegaly, JVD, or HJR, though body habitus limits evaluation. No carotid bruit. Lungs: Normal work of breathing. Clear to auscultation bilaterally without wheezes or crackles. Heart: Regular rate and rhythm without murmurs, rubs, or gallops. Unable to assess PMI due to body habitus. Abd: Bowel sounds present. Soft, NT/ND.  Unable to assess HSM due to body habitus. Ext: No lower extremity edema. Radial, PT, and DP pulses are 2+ bilaterally Skin: Warm and dry.  Thickening and scaling of skin on both calves. Neuro: CNIII-XII intact. Strength and fine-touch sensation intact in upper and lower extremities bilaterally. Psych: Normal mood and affect.  EKG:  NSR with left axis deviation, poor R wave progression, and inferior Q waves.  No significant change from prior tracing.  No significant change from prior tracing on 05/23/19.  Lab Results  Component Value Date   WBC 7.1 04/22/2019   HGB 14.8 04/22/2019   HCT 43.6 04/22/2019   MCV 85 04/22/2019   PLT 281 04/22/2019    Lab Results  Component Value Date   NA 140 04/22/2019   K 4.3 04/22/2019   CL 101 04/22/2019   CO2 24 04/22/2019   BUN 11 04/22/2019   CREATININE 0.87 04/22/2019   GLUCOSE 95 04/22/2019   ALT 42 04/22/2019    Lab Results  Component Value Date   CHOL 138 04/22/2019   HDL 30 (L) 04/22/2019   LDLCALC 73 04/22/2019   TRIG 211 (H) 04/22/2019   CHOLHDL 4.6 04/22/2019     --------------------------------------------------------------------------------------------------  ASSESSMENT AND PLAN: Chest pain, shortness of breath, and abnormal EKG: Chest pain and dyspnea began with COVID-19 pandemic and seem to be associated with anxiety.  Overall, the frequency and severity of the episodes are improving.  However, exertional dyspnea persists and is likely  multifactorial.  Cardiac risk factors include hypertension, male gender, prior tobacco use, and morbid obesity.  EKG today shows poor R wave progression and inferior Q waves that may be a manifestation of lead placement in the setting of the patient's body habitus.  However, given that he has been having some symptoms, albeit atypical, and has an abnormal EKG, I have recommended noninvasive ischemia testing, particularly before proceeding with elective surgery.  We discussed exercise stress testing, but Steve [name] is concerned about achieving target heart rate given his exertional dyspnea.  In light of his BMI of 48, we have agreed to perform a pharmacologic myocardial perfusion PET/CT at [facility].  We will defer medication changes at this time.  Hypertension: Blood pressure is borderline elevated today in the setting of recently having started HCTZ.  Steve [name] should continue HCTZ and follow-up with Ms. [name] for ongoing management.  Morbid obesity: I suspect that his exertional dyspnea, elevated blood pressure, and sleep apnea would improve with weight loss.  I have encouraged weight loss through diet and exercise.  Follow-up: Return to clinic in 3 months.  Nelva Bush, MD 05/28/2019 4:29 PM

## 2019-05-27 NOTE — Patient Instructions (Signed)
Medication Instructions:  Your physician recommends that you continue on your current medications as directed. Please refer to the Current Medication list given to you today.  *If you need a refill on your cardiac medications before your next appointment, please call your pharmacy*   Lab Work:  If you have labs (blood work) drawn today and your tests are completely normal, you will receive your results only by: Marland Kitchen MyChart Message (if you have MyChart) OR . A paper copy in the mail If you have any lab test that is abnormal or we need to change your treatment, we will call you to review the results.   Testing/Procedures: Dr End has order a Pharmacological Myocardial PET/CT. This will take place a [employer]. It may take several business days before being scheduled, as pre certification with insurance if obtained prior to. Then someone from [facility] will call you schedule and give you further information pertaining to scheduling.   Follow-Up: At [facility], you and your health needs are our priority.  As part of our continuing mission to provide you with exceptional heart care, we have created designated Provider Care Teams.  These Care Teams include your primary Cardiologist (physician) and Advanced Practice Providers (APPs -  Physician Assistants and Nurse Practitioners) who all work together to provide you with the care you need, when you need it.  We recommend signing up for the patient portal called "MyChart".  Sign up information is provided on this After Visit Summary.  MyChart is used to connect with patients for Virtual Visits (Telemedicine).  Patients are able to view lab/test results, encounter notes, upcoming appointments, etc.  Non-urgent messages can be sent to your provider as well.   To learn more about what you can do with MyChart, go to [email or web address].    Your next appointment:   3 month(s)  The format for your next appointment:   In Person  Provider:     You may see DR Cristal Deer END or one of the following Advanced Practice Providers on your designated Care Team:    Nicolasa Ducking, NP  Eula Listen, PA-C  Marisue Ivan, PA-C

## 2019-05-28 ENCOUNTER — Telehealth: Payer: Self-pay | Admitting: *Deleted

## 2019-05-28 ENCOUNTER — Encounter: Payer: Self-pay | Admitting: Adult Health

## 2019-05-28 ENCOUNTER — Encounter: Payer: Self-pay | Admitting: Internal Medicine

## 2019-05-28 DIAGNOSIS — R0602 Shortness of breath: Secondary | ICD-10-CM | POA: Insufficient documentation

## 2019-05-28 DIAGNOSIS — R0789 Other chest pain: Secondary | ICD-10-CM | POA: Insufficient documentation

## 2019-05-28 DIAGNOSIS — R9431 Abnormal electrocardiogram [ECG] [EKG]: Secondary | ICD-10-CM | POA: Insufficient documentation

## 2019-05-28 NOTE — Telephone Encounter (Signed)
Order refaxed with appropriate CPT code to [facility] successfully.

## 2019-05-28 NOTE — Telephone Encounter (Signed)
No need for viability, only rest/stress myocardial PET/CT.  Thanks.  Yvonne Kendall, MD [facility]

## 2019-05-28 NOTE — Telephone Encounter (Signed)
Order and precert info approval faxed to [phone] at [facility] scheduling. They will reach out to patient to schedule when able.

## 2019-05-28 NOTE — Telephone Encounter (Signed)
Received call from [place] at [facility]. She wanted to clarify if Dr End wanted viability images or only the PET/CT Stress test. This is in order to provide the correct CPT code. Advised I will make sure with Dr End and let her know.

## 2019-06-03 ENCOUNTER — Other Ambulatory Visit: Payer: Self-pay

## 2019-06-03 ENCOUNTER — Ambulatory Visit
Admission: RE | Admit: 2019-06-03 | Discharge: 2019-06-03 | Disposition: A | Discharge status: Home / Self Care | Payer: BC Managed Care – PPO | Source: Ambulatory Visit | Attending: Adult Health | Admitting: Adult Health

## 2019-06-03 DIAGNOSIS — Z8719 Personal history of other diseases of the digestive system: Secondary | ICD-10-CM | POA: Diagnosis not present

## 2019-06-03 DIAGNOSIS — K8020 Calculus of gallbladder without cholecystitis without obstruction: Secondary | ICD-10-CM | POA: Diagnosis not present

## 2019-06-03 NOTE — Progress Notes (Signed)
Ultrasound does show multiple gallstones in gallbladder. HIDA scan would be the next step.  Radiologist also notes likely fatty liver. Increase healthy diet and exercise to help with this. Report any new or changing symptoms. Would like to repeat ultrasound on liver in 6 months to 1 year.

## 2019-06-04 ENCOUNTER — Telehealth: Payer: Self-pay

## 2019-06-06 DIAGNOSIS — R079 Chest pain, unspecified: Secondary | ICD-10-CM | POA: Diagnosis not present

## 2019-06-06 DIAGNOSIS — K8020 Calculus of gallbladder without cholecystitis without obstruction: Secondary | ICD-10-CM | POA: Diagnosis not present

## 2019-06-06 DIAGNOSIS — R9431 Abnormal electrocardiogram [ECG] [EKG]: Secondary | ICD-10-CM | POA: Diagnosis not present

## 2019-06-06 DIAGNOSIS — E669 Obesity, unspecified: Secondary | ICD-10-CM | POA: Diagnosis not present

## 2019-06-06 DIAGNOSIS — I10 Essential (primary) hypertension: Secondary | ICD-10-CM | POA: Diagnosis not present

## 2019-06-06 DIAGNOSIS — I2510 Atherosclerotic heart disease of native coronary artery without angina pectoris: Secondary | ICD-10-CM | POA: Diagnosis not present

## 2019-06-06 DIAGNOSIS — Z6841 Body Mass Index (BMI) 40.0 and over, adult: Secondary | ICD-10-CM | POA: Diagnosis not present

## 2019-06-07 ENCOUNTER — Telehealth: Payer: Self-pay | Admitting: *Deleted

## 2019-06-07 NOTE — Telephone Encounter (Signed)
-----   Message from Yvonne Kendall, MD sent at 06/07/2019 11:09 AM EST ----- Regarding: RE: myocardial PET CT Thanks for the update.  Please let Mr. Akamine know that his stress test does not show evidence of a significant blockage.  It did show a sizable gallstone, which I believe is not a new finding.  He should continue his current medications and f/u with [place] as previously arranged.  Have a great weekend.  Thayer Ohm ----- Message ----- From: Stann Mainland, RN Sent: 06/07/2019  10:47 AM EST To: Yvonne Kendall, MD Subject: myocardial PET CT                              Hi Dr End,  Looks like this patient had his procedure on 06/06/19. The results are in CareEverywhere.  Hope you are having a great [place]! Britt Boozer

## 2019-06-07 NOTE — Telephone Encounter (Signed)
Results called to pt. Pt verbalized understanding of results and plan of care.  

## 2019-06-12 NOTE — Telephone Encounter (Signed)
Opened in error

## 2019-06-20 ENCOUNTER — Other Ambulatory Visit: Payer: Self-pay

## 2019-06-20 ENCOUNTER — Encounter: Payer: Self-pay | Admitting: Adult Health

## 2019-06-20 ENCOUNTER — Ambulatory Visit (INDEPENDENT_AMBULATORY_CARE_PROVIDER_SITE_OTHER): Payer: BC Managed Care – PPO | Admitting: Adult Health

## 2019-06-20 VITALS — BP 140/80 | HR 74 | Temp 97.1°F | Resp 16 | Wt 352.6 lb

## 2019-06-20 DIAGNOSIS — Z6841 Body Mass Index (BMI) 40.0 and over, adult: Secondary | ICD-10-CM

## 2019-06-20 DIAGNOSIS — E781 Pure hyperglyceridemia: Secondary | ICD-10-CM

## 2019-06-20 DIAGNOSIS — I10 Essential (primary) hypertension: Secondary | ICD-10-CM

## 2019-06-20 DIAGNOSIS — K8020 Calculus of gallbladder without cholecystitis without obstruction: Secondary | ICD-10-CM | POA: Diagnosis not present

## 2019-06-20 MED ORDER — HYDROCHLOROTHIAZIDE 25 MG PO TABS: 25.0000 mg | ORAL_TABLET | Freq: Every day | ORAL | 0 refills | Status: DC

## 2019-06-20 NOTE — Progress Notes (Addendum)
Patient: Steve [name] Male    DOB: 01-10-1976   44 y.o.   MRN: [id number] Visit Date: 06/20/2019  Today's Provider: Jairo Ben, FNP   Chief Complaint  Patient presents with  . Hypertension   Subjective:     HPI  Hypertension, follow-up:  BP Readings from Last 3 Encounters:  05/27/19 140/90  05/23/19 (!) 140/92  04/22/19 124/86    He was last seen for hypertension 1 months ago.  BP at that visit was 140/92. Management changes since that visit include starting patient on HCTZ 25mg  and placing referral to cardiology. Patient states that he did see cardiologist and had a stress test.  He reports good compliance with treatment. He is not having side effects.  He is exercising. He is adherent to low salt diet.   Outside blood pressures are not being checked regularly. He is experiencing none.  Patient denies chest pain, chest pressure/discomfort, claudication, dyspnea, exertional chest pressure/discomfort, fatigue, irregular heart beat, lower extremity edema, near-syncope, orthopnea, palpitations, paroxysmal nocturnal dyspnea, syncope and tachypnea.   Cardiovascular risk factors include hypertension, male gender and obesity (BMI >= 30 kg/m2).  Use of agents associated with hypertension: none.   Patient has seen Dr. And in cardiology, and had a myocardial perfusion scan performed at [facility] healthcare results are reviewed by provider as below. Reviewed results : PET CT Myocardial Perfusion Multiple3/01/2020 [facility], [place] Other Result Information This result has an attachment that is not available. Result Narrative Exam: PET MYOCARDIAL PERFUSION MULTIPLE (Rest/Stress Rb - Regadenoson)  +++++++++++++++++++++++++++++++++++++++++++++++++ Impressions: - Low risk probably normal myocardial perfusion study - There is a small in size, subtle in severity, fixed defect involving the  apical inferior segment. This is consistent with possible artifact    Weight trend: fluctuating a bit Wt Readings from Last 3 Encounters:  05/27/19 (!) 343 lb (155.6 kg)  05/23/19 (!) 353 lb (160.1 kg)  04/22/19 (!) 344 lb 6.4 oz (156.2 kg)    He has a appointment for 2nd Covid shot nextt week, he had mild side effects fever 99.5 with first immunization.   He still has carpal tunnel and will be seeing May 2021 as he had to reschedule this appointment.   Current diet: in general, a "healthy" diet   He has tried to make changes in his health, and trying to exercise more and maintain a healthy diet.  He is drinking water daily and decreased his soda intake only drinks an occasional diet soda.  Reminded of sodium intake with this.    Known gallstones were seen on his myocardial perfusion scan per Dr. And, this is a known matter, patient was diagnosed with cholelithiasis at the emergency room in June 2021.  He says that he is doing well watching his diet, and has not had any flares or right upper quadrant pain since he was last seen in the office.  I did send him for a right upper quadrant abdominal ultrasound.  He had a negative Murphy sign that day, he did have multiple shadowing gallstones measuring up to 1.3 cm located near and possibly within the gallbladder neck.  Discussed that this can cause an obstruction, advised him to go to the emergency room if any symptoms change or worsen.  He is not ready for referral to a surgeon, risk and benefits were discussed.  He wants to have surgery done in [place].  So that his mom can help take care of him.  He declines  HIDA scan at this time as well.  Has have a follow-up with Dr. Okey Dupre in June, no further recommendations for any further cardiology work-up was recommended at that time.  Patient reports that he just feels much more at ease knowing his myocardial scan did not show any major blockages.  Patient's blood pressure still elevated today at 140/80, it is improved since last visit, he has not been consistent with taking  his medication, he has missed medication on the weekend.  He is doing hydrochlorothiazide 25 mg daily.  Patient also reports that he has been taking cough suppressant for his allergies, it is advised that he take an antihistamine such as Zyrtec or an Allegra daily and to avoid decongestants when able to as this will increase his blood pressure.  Patient verbalized understanding of this.  He also has been taking some ibuprofen 400 mg daily for his wrist, advised on NSAIDs can increase blood pressure as well with long-term use.  Patient  denies any fever, body aches,chills, rash, chest pain, shortness of breath, nausea, vomiting, or diarrhea.   ------------------------------------------------------------------------  No Known Allergies   Current Outpatient Medications:  .  hydrochlorothiazide (HYDRODIURIL) 25 MG tablet, Take 1 tablet (25 mg total) by mouth daily., Disp: 90 tablet, Rfl: 0  Review of Systems  Social History   Tobacco Use  . Smoking status: Former Games developer  . Smokeless tobacco: Former Neurosurgeon    Types: Snuff    Quit date: 08/2018  Substance Use Topics  . Alcohol use: Yes    Alcohol/week: 2.0 - 3.0 standard drinks    Types: 2 - 3 Cans of beer per week      Objective:     Blood pressure 140/80, pulse 74, temperature (!) 97.1 F (36.2 C), temperature source temporal,  resp. rate 16, weight (!) 352 lb 9.6 oz (159.9 kg).   Physical Exam Nursing note reviewed.  Constitutional:      General: He is not in acute distress.    Appearance: He is obese. He is not ill-appearing, toxic-appearing or diaphoretic.     Comments: Patient is alert and oriented and responsive to questions Engages in eye contact with provider. Speaks in full sentences without any pauses without any shortness of breath or distress.    HENT:     Head: Normocephalic and atraumatic.     Nose: Nose normal. No congestion or rhinorrhea.     Mouth/Throat:     Pharynx: No oropharyngeal exudate or posterior  oropharyngeal erythema.  Eyes:     General: No scleral icterus.       Right eye: No discharge.        Left eye: No discharge.     Pupils: Pupils are equal, round, and reactive to light.  Neck:     Vascular: No carotid bruit.  Cardiovascular:     Rate and Rhythm: Normal rate and regular rhythm.     Pulses: Normal pulses.     Heart sounds: Normal heart sounds. No murmur. No friction rub. No gallop.   Pulmonary:     Effort: Pulmonary effort is normal. No respiratory distress.     Breath sounds: Normal breath sounds. No stridor. No wheezing, rhonchi or rales.  Chest:     Chest wall: No tenderness.  Abdominal:     General: Abdomen is flat. There is no distension.     Palpations: Abdomen is soft. There is no mass.     Tenderness: There is no abdominal tenderness.  Hernia: No hernia is present.  Musculoskeletal:        General: Normal range of motion.     Cervical back: Normal range of motion and neck supple. No rigidity or tenderness.  Lymphadenopathy:     Cervical: No cervical adenopathy.  Skin:    General: Skin is warm and dry.     Capillary Refill: Capillary refill takes less than 2 seconds.  Neurological:     Mental Status: He is alert and oriented to person, place, and time.     Cranial Nerves: No cranial nerve deficit.     Sensory: No sensory deficit.     Motor: No weakness.     Coordination: Coordination normal.     Gait: Gait normal.     Deep Tendon Reflexes: Reflexes normal.  Psychiatric:        Mood and Affect: Mood normal.        Behavior: Behavior normal.        Thought Content: Thought content normal.        Judgment: Judgment normal.     Comments: Much less anxious at today's visit       No results found for any visits on 06/20/19.     Assessment & Plan    Hypertension, unspecified type - Plan: CBC with Differential/Platelet, Comprehensive Metabolic Panel (CMET)  High triglycerides - Plan: Lipid Panel w/o Chol/HDL Ratio  Class 3 severe obesity due to  excess calories with body mass index (BMI) of 45.0 to 49.9 in adult, unspecified whether serious comorbidity present (HCC)  Calculus of gallbladder without cholecystitis without obstruction - Plan: CBC with Differential/Platelet, Comprehensive Metabolic Panel (CMET), Amylase, Lipase   Recommended adding losartan to regimen or doing a combination pill, patient declined at this time, he wants to continue working on diet and exercise.  He will record readings at home with a large cuff, and report back if his readings consistently staying over 130/80.  We will discontinue decongestion this will also help with lowering blood pressure.  Have labs drawn sometime around the first part of June, and a follow-up visit afterwards.  We will check CBC, CMP and lipids. He decide he wants a referral to a surgeon for his gallbladder, or to proceed with a HIDA scan, he will call the office and let me know.  Client denied any medications for his allergies, recommended Allegra, Flonase and possibly allergy eyedrops due to the seasonal allergies that he has.  Will call if the symptoms persist and he needs this.  Urgent signs and symptoms of cholecystitis this, and signs of infection reviewed with patient and when to seek emergency medical attention needed only in the emergency room if needed.  She verbalized understanding of all instructions, and he will call with any additional questions or concerns.  Diet discussed as well.  Recommend increased exercise, and healthy lifestyle.  Healthy diet.  Information was also given in after visit summary regarding diet.  Furl to nutritionist can be made if patient so desires. Return in about 3 months (around 09/20/2019), or if symptoms worsen or fail to improve, for at any time for any worsening symptoms, Go to Emergency room/ urgent care if worse.  Advised patient call the office or your primary care doctor for an appointment if no improvement within 72 hours or if any symptoms  change or worsen at any time  Advised ER or urgent Care if after hours or on weekend. Call 911 for emergency symptoms at any time.Patinet verbalized understanding of  all instructions given/reviewed and treatment plan and has no further questions or concerns at this time.        The entirety of the information documented in the History of Present Illness, Review of Systems and Physical Exam were personally obtained by me. Portions of this information were initially documented by the  Certified Medical Assistant whose name is documented in Epic and reviewed by me for thoroughness and accuracy.  I have personally performed the exam and reviewed the chart and it is accurate to the best of my knowledge.  Museum/gallery conservator has been used and any errors in dictation or transcription are unintentional.  Eula Fried. Flinchum FNP-C  [facility]   Jairo Ben, FNP  [facility]

## 2019-07-05 ENCOUNTER — Ambulatory Visit: Payer: Self-pay

## 2019-07-05 DIAGNOSIS — R030 Elevated blood-pressure reading, without diagnosis of hypertension: Secondary | ICD-10-CM | POA: Diagnosis not present

## 2019-07-05 NOTE — Telephone Encounter (Signed)
Pt. Reports he has been getting high BP readings today. The last two were 192/87 and 172/86. States "I feel fine just a little tired." No availability with his PCP today. Offered another provider. States he will go to UC. Reason for Disposition . Systolic BP  >= 180 OR Diastolic >= 110  Answer Assessment - Initial Assessment Questions 1. BLOOD PRESSURE: "What is the blood pressure?" "Did you take at least two measurements 5 minutes apart?"     192/87  172/86 2. ONSET: "When did you take your blood pressure?"     This afternoon 3. HOW: "How did you obtain the blood pressure?" (e.g., visiting nurse, automatic home BP monitor)     Home cuff - wrist 4. HISTORY: "Do you have a history of high blood pressure?"     Yes 5. MEDICATIONS: "Are you taking any medications for blood pressure?" "Have you missed any doses recently?"     Yes 6. OTHER SYMPTOMS: "Do you have any symptoms?" (e.g., headache, chest pain, blurred vision, difficulty breathing, weakness)     "A little tired." 7. PREGNANCY: "Is there any chance you are pregnant?" "When was your last menstrual period?"     n/a  Protocols used: HIGH BLOOD PRESSURE-A-AH

## 2019-07-05 NOTE — Telephone Encounter (Signed)
Will need to contact next week to schedule follow up on hypertension.

## 2019-07-08 NOTE — Telephone Encounter (Signed)
Patient has been advised. KW 

## 2019-07-08 NOTE — Telephone Encounter (Signed)
He can follow up as needed. Keep already scheduled follow up and return sooner if needed.  Keep log of readings at home.Instruct on correct way of taking blood pressure at home, bring cuff to next vist for [place] to compare to ours. He does not have to schedule appointment if no other symptoms and reading are within normal limits. Call if abnormal readings or symptomatic. As always seek care immediately if emergent.

## 2019-07-08 NOTE — Telephone Encounter (Signed)
Called patient to do telephone follow up, patient reported that he is feeling well. He states that he went to [facility] urgent care Friday after high blood pressure readings at home. Patient reports that his blood pressure was checked twice in office, he states that blood pressure readings were 114/69 and 123/78. Patient says he believe it was just his machine at home that was reading blood pressure as high. Patient denied symptoms of chest pain/pressure, dyspnea, exertional chest pressure, edema , headache or visual changes. I was going to schedule patient follow up appointment but I need you to review over your schedule with the new model that we start in office I do not see any openings to place this patient. KW

## 2019-07-29 ENCOUNTER — Other Ambulatory Visit: Payer: Self-pay

## 2019-07-29 ENCOUNTER — Encounter: Payer: Self-pay | Admitting: Emergency Medicine

## 2019-07-29 ENCOUNTER — Telehealth: Payer: Self-pay

## 2019-07-29 ENCOUNTER — Ambulatory Visit
Admission: EM | Admit: 2019-07-29 | Discharge: 2019-07-29 | Disposition: A | Discharge status: Home / Self Care | Payer: BC Managed Care – PPO | Attending: Family Medicine | Admitting: Family Medicine

## 2019-07-29 DIAGNOSIS — M10072 Idiopathic gout, left ankle and foot: Secondary | ICD-10-CM | POA: Diagnosis not present

## 2019-07-29 MED ORDER — PREDNISONE 20 MG PO TABS
ORAL_TABLET | ORAL | 0 refills | Status: DC
Start: 2019-07-29 — End: 2019-08-28

## 2019-07-29 NOTE — Telephone Encounter (Signed)
Please advise 

## 2019-07-29 NOTE — Telephone Encounter (Signed)
Follow up in office if persists recommended for evaluation.  Injury ?

## 2019-07-29 NOTE — Telephone Encounter (Signed)
Copied from CRM [id number]. Topic: General - Other >> Jul 29, 2019 10:52 AM Floria Raveling A wrote: Pt called in stated that he was put on a water pill, he stated he woke up this morning and has pain in his toe and thinks he may have gout.  He stated he thinks it is from taking the water pill.  He would like to know what Marcelino Duster would advise.  He stated it hurts to even touch it  Best number [phone] Pharmacy -[facility] [place]

## 2019-07-29 NOTE — ED Provider Notes (Signed)
[facility]    CSN: [id number] Arrival date & time: 07/29/19  1742      History   Chief Complaint Chief Complaint  Patient presents with  . Toe Pain    left great    HPI Steve [name] is a 44 y.o. male.   44 yo male with a c/o left great toe pain that started spontaneously yesterday. Patient denies any injuries, fevers, chills, rash, drainage. States that even the bed sheets touching it hurts. Patient states that he recently started taking HCTZ diuretic for blood pressure.    Toe Pain    Past Medical History:  Diagnosis Date  . Gall bladder inflammation   . Hypertension   . Obstructive sleep apnea     Patient Active Problem List   Diagnosis Date Noted  . Calculus of gallbladder without cholecystitis without obstruction 06/20/2019  . Chest pain of uncertain etiology 98/33/8250  . Shortness of breath 05/28/2019  . Abnormal EKG 05/28/2019  . High triglycerides 05/23/2019  . History of cholelithiasis 05/23/2019  . Hypertension 05/23/2019  . Morbid obesity ([place]) 04/22/2019  . Use of cannabis oil 04/22/2019  . Fatigue 04/22/2019  . Encounter for annual physical exam 04/22/2019  . RUQ abdominal pain 04/22/2019  . Paresthesia of both hands 04/22/2019  . Bilateral wrist pain 04/22/2019  . Class 3 severe obesity due to excess calories with body mass index (BMI) of 45.0 to 49.9 in adult [facility]) 04/22/2019  . History of chest pain 04/22/2019  . Anxiety 04/22/2019  . Family history of coronary artery disease 04/22/2019    Past Surgical History:  Procedure Laterality Date  . ADENOIDECTOMY         Home Medications    Prior to Admission medications   Medication Sig Start Date End Date Taking? Authorizing Provider  hydrochlorothiazide (HYDRODIURIL) 25 MG tablet Take 1 tablet (25 mg total) by mouth daily. 06/20/19  Yes Flinchum, Kelby Aline, FNP  UNABLE TO FIND CBD oil prn   Yes [provider]  predniSONE (DELTASONE) 20 MG tablet 3 tabs po  qd x 2 days, then 2 tabs po qd x 3 days, then 1 tab po qd x 3 days, then half a tab po qd x 2 days 07/29/19   Norval Gable, MD    Family History Family History  Problem Relation Age of Onset  . Diabetes Mother   . Diabetes Maternal Grandfather   . Heart attack Father 50    Social History Social History   Tobacco Use  . Smoking status: Former Smoker    Packs/day: 0.25    Years: 0.50    Pack years: 0.12    Types: Cigarettes    Quit date: 07/29/1999    Years since quitting: 20.0  . Smokeless tobacco: Former Systems developer    Types: Snuff    Quit date: 08/2018  Substance Use Topics  . Alcohol use: Yes    Alcohol/week: 2.0 - 3.0 standard drinks    Types: 2 - 3 Cans of beer per week  . Drug use: No     Allergies   Patient has no known allergies.   Review of Systems Review of Systems   Physical Exam Triage Vital Signs ED Triage Vitals  Enc Vitals Group     BP 07/29/19 1755 133/84     Pulse Rate 07/29/19 1755 73     Resp 07/29/19 1755 18     Temp 07/29/19 1755 98.1 F (36.7 C)     Temp  Source 07/29/19 1755 Oral     SpO2 07/29/19 1755 100 %     Weight 07/29/19 1755 (!) 340 lb (154.2 kg)     Height 07/29/19 1755 5\' 10"  (1.778 m)     Head Circumference --      Peak Flow --      Pain Score 07/29/19 1754 7     Pain Loc --      Pain Edu? --      Excl. in GC? --    No data found.  Updated Vital Signs BP 133/84 (BP Location: Left Arm)   Pulse 73   Temp 98.1 F (36.7 C) (Oral)   Resp 18   Ht 5\' 10"  (1.778 m)   Wt (!) 154.2 kg   SpO2 100%   BMI 48.78 kg/m   Visual Acuity Right Eye Distance:   Left Eye Distance:   Bilateral Distance:    Right Eye Near:   Left Eye Near:    Bilateral Near:     Physical Exam Vitals and nursing note reviewed.  Constitutional:      General: He is not in acute distress.    Appearance: He is not toxic-appearing or diaphoretic.  Musculoskeletal:     Left foot: Swelling (and erythema over the 1st MTP joint), tenderness and bony  tenderness (over the 1st MTP joint) present. No laceration. Normal pulse.     Comments: Left foot neurovascularly intact; no skin lesion or drainage  Neurological:     Mental Status: He is alert.      UC Treatments / Results  Labs (all labs ordered are listed, but only abnormal results are displayed) Labs Reviewed - No data to display  EKG   Radiology No results found.  Procedures Procedures (including critical care time)  Medications Ordered in UC Medications - No data to display  Initial Impression / Assessment and Plan / UC Course  I have reviewed the triage vital signs and the nursing notes.  Pertinent labs & imaging results that were available during my care of the patient were reviewed by me and considered in my medical decision making (see chart for details).      Final Clinical Impressions(s) / UC Diagnoses   Final diagnoses:  Acute idiopathic gout involving toe of left foot    ED Prescriptions    Medication Sig Dispense Auth. Provider   predniSONE (DELTASONE) 20 MG tablet 3 tabs po qd x 2 days, then 2 tabs po qd x 3 days, then 1 tab po qd x 3 days, then half a tab po qd x 2 days 16 tablet Avi Kerschner, MD      1. diagnosis reviewed with patient 2. rx as per orders above; reviewed possible side effects, interactions, risks and benefits  3. Recommend supportive treatment with rest, elevation, ice, otc tylenol prn 4. Follow-up prn if symptoms worsen or don't improve   PDMP not reviewed this encounter.   09/28/19, MD 07/29/19 [phone]

## 2019-07-29 NOTE — ED Triage Notes (Signed)
Patient in today c/o left great toe pain since yesterday. Patient denies any injury. Patient states it has gotten to the point that a sheet on his toe hurts. Patient denies any history of gout.

## 2019-07-29 NOTE — Telephone Encounter (Signed)
Spoke with patient on the phone he states that he can only come in office Wednesday or Thursday. I placed patient on your schedule at 2pm on 5/6 but it was kind of a squeeze in because your booked, so I marked the 3pm time slot as unavailable that day so you have enough time between this patient at your 2:20 patient.KW

## 2019-07-30 NOTE — Telephone Encounter (Signed)
That is fine. Thanks 

## 2019-08-01 ENCOUNTER — Encounter: Payer: Self-pay | Admitting: Adult Health

## 2019-08-01 ENCOUNTER — Ambulatory Visit: Payer: BC Managed Care – PPO | Admitting: Adult Health

## 2019-08-01 ENCOUNTER — Other Ambulatory Visit: Payer: Self-pay

## 2019-08-01 VITALS — BP 144/90 | HR 80 | Temp 97.3°F | Resp 16 | Wt 358.4 lb

## 2019-08-01 DIAGNOSIS — I10 Essential (primary) hypertension: Secondary | ICD-10-CM

## 2019-08-01 DIAGNOSIS — M109 Gout, unspecified: Secondary | ICD-10-CM | POA: Diagnosis not present

## 2019-08-01 MED ORDER — LOSARTAN POTASSIUM 25 MG PO TABS: 25.0000 mg | ORAL_TABLET | Freq: Every day | ORAL | 0 refills | Status: DC

## 2019-08-01 NOTE — Progress Notes (Signed)
Established patient visit   Patient: Steve [name]   DOB: 03-17-1976   44 y.o. Male  MRN: [id number] Visit Date: 08/01/2019  Today's healthcare provider: Jairo Ben, FNP   Rae Lips as a scribe for Beverely Pace Ysidro Ramsay, FNP.,have documented all relevant documentation on the behalf of Beverely Pace Tatelyn Vanhecke, FNP,as directed by  Jairo Ben, FNP while in the presence of Jairo Ben, FNP. Chief Complaint  Patient presents with  . Follow-up   Subjective    HPI Follow up Urgent Care visit  Patient was seen in [facility] for toe pain on 07/29/19.Onset of symptoms began 07/27/19.  He was treated for gout of the left great toe. Treatment for this included prednisone. He reports excellent compliance with treatment. He reports this condition is Improved.  ----------------------------------------------------------------------------------------- Hypertension, follow-up  BP Readings from Last 3 Encounters:  08/01/19 (!) 144/90  07/29/19 133/84  06/20/19 140/80   Wt Readings from Last 3 Encounters:  08/01/19 (!) 358 lb 6.4 oz (162.6 kg)  07/29/19 (!) 340 lb (154.2 kg)  06/20/19 (!) 352 lb 9.6 oz (159.9 kg)     He was last seen for hypertension 1 months ago.  BP at that visit was 140/80. Management since that visit includes none labs were ordered.  He reports excellent compliance with treatment. He is not having side effects.  He is following a Regular diet. He is not exercising. He does not smoke.  Use of agents associated with hypertension: none.   Outside blood pressures are systolic ranging from 114-149 and diastolic ranging from 53-74. Symptoms: No chest pain No chest pressure No palpitations No dyspnea No orthopnea No paroxysmal nocturnal dyspnea Yes lower extremity edema No syncope   Pertinent labs: Lab Results  Component Value Date   CHOL 138 04/22/2019   HDL 30 (L) 04/22/2019   LDLCALC 73  04/22/2019   TRIG 211 (H) 04/22/2019   CHOLHDL 4.6 04/22/2019   Lab Results  Component Value Date   NA 140 04/22/2019   K 4.3 04/22/2019   CO2 24 04/22/2019   GLUCOSE 95 04/22/2019   BUN 11 04/22/2019   CREATININE 0.87 04/22/2019   CALCIUM 9.0 04/22/2019   GFRNONAA 106 04/22/2019   GFRAA 123 04/22/2019     The 10-year ASCVD risk score Denman George DC Jr., et al., 2013) is: 2.1%   --------------------------------------------------------------------------------------------------- Patient Active Problem List   Diagnosis Date Noted  . Calculus of gallbladder without cholecystitis without obstruction 06/20/2019  . Chest pain of uncertain etiology 05/28/2019  . Shortness of breath 05/28/2019  . Abnormal EKG 05/28/2019  . High triglycerides 05/23/2019  . History of cholelithiasis 05/23/2019  . Hypertension 05/23/2019  . Morbid obesity (HCC) 04/22/2019  . Use of cannabis oil 04/22/2019  . Fatigue 04/22/2019  . Encounter for annual physical exam 04/22/2019  . RUQ abdominal pain 04/22/2019  . Paresthesia of both hands 04/22/2019  . Bilateral wrist pain 04/22/2019  . Class 3 severe obesity due to excess calories with body mass index (BMI) of 45.0 to 49.9 in adult [facility]) 04/22/2019  . History of chest pain 04/22/2019  . Anxiety 04/22/2019  . Family history of coronary artery disease 04/22/2019   Past Medical History:  Diagnosis Date  . Gall bladder inflammation   . Hypertension   . Obstructive sleep apnea    No Known Allergies     Medications: Outpatient Medications Prior to Visit  Medication Sig Note  . predniSONE (DELTASONE) 20 MG tablet  3 tabs po qd x 2 days, then 2 tabs po qd x 3 days, then 1 tab po qd x 3 days, then half a tab po qd x 2 days   . UNABLE TO FIND CBD oil prn   . [DISCONTINUED] hydrochlorothiazide (HYDRODIURIL) 25 MG tablet Take 1 tablet (25 mg total) by mouth daily. 08/01/2019: patient had gout flare potentially related to HCTZ   No facility-administered  medications prior to visit.    Review of Systems  Constitutional: Negative.   HENT: Negative.   Respiratory: Negative.   Cardiovascular: Negative.   Gastrointestinal: Negative.   Genitourinary: Negative.   Musculoskeletal: Positive for joint swelling (left great toe - resolving - took Prednisone dose pack ). Negative for arthralgias, back pain and gait problem.  Skin: Negative.   Neurological: Negative.   Hematological: Negative.   Psychiatric/Behavioral: Negative.     Last CBC Lab Results  Component Value Date   WBC 7.1 04/22/2019   HGB 14.8 04/22/2019   HCT 43.6 04/22/2019   MCV 85 04/22/2019   MCH 28.8 04/22/2019   RDW 12.7 04/22/2019   PLT 281 04/22/2019      Objective    BP (!) 144/90   Pulse 80   Temp (!) 97.3 F (36.3 C) (Oral)   Resp 16   Wt (!) 358 lb 6.4 oz (162.6 kg)   SpO2 97%   BMI 51.43 kg/m  BP Readings from Last 3 Encounters:  08/01/19 (!) 144/90  07/29/19 133/84  06/20/19 140/80      Physical Exam Vitals reviewed. Exam conducted with a chaperone present.  Constitutional:      Appearance: Normal appearance. He is obese.  HENT:     Head: Normocephalic and atraumatic.     Nose: Nose normal. No congestion or rhinorrhea.     Mouth/Throat:     Mouth: Mucous membranes are moist.     Pharynx: No oropharyngeal exudate or posterior oropharyngeal erythema.  Eyes:     Extraocular Movements: Extraocular movements intact.     Conjunctiva/sclera: Conjunctivae normal.     Pupils: Pupils are equal, round, and reactive to light.  Cardiovascular:     Rate and Rhythm: Normal rate and regular rhythm.     Pulses: Normal pulses.     Heart sounds: Normal heart sounds. No murmur. No friction rub. No gallop.   Pulmonary:     Effort: Pulmonary effort is normal. No respiratory distress.     Breath sounds: Normal breath sounds. No stridor. No wheezing, rhonchi or rales.  Chest:     Chest wall: No tenderness.  Abdominal:     General: There is no distension.       Palpations: Abdomen is soft. There is no mass.     Tenderness: There is no abdominal tenderness. There is no right CVA tenderness, left CVA tenderness, guarding or rebound.     Hernia: No hernia is present.  Musculoskeletal:        General: Normal range of motion.     Cervical back: Normal range of motion and neck supple.  Feet:     Comments: Severe dry skin lower extremities  Skin:    General: Skin is warm and dry.     Capillary Refill: Capillary refill takes less than 2 seconds.     Findings: No erythema.  Neurological:     Mental Status: He is alert and oriented to person, place, and time.  Psychiatric:        Mood and Affect: Mood  normal.        Behavior: Behavior normal.        Thought Content: Thought content normal.        Judgment: Judgment normal.       No results found for any visits on 08/01/19.  Assessment & Plan     Hypertension, unspecified type - Plan: losartan (COZAAR) 25 MG tablet  Acute gout involving toe of left foot, unspecified cause  Gout resolving, discussed purines, and foods high in uric acid.  He has extremely dry skin lower extremities, recommend vascular ultrasound/ evaluation, he declines at this time. Using emollients and feels it is improving discussed risks versus benefits.  Red flags discussed.   Increase exercise / weight loss.  He would like to discontinue the HCTZ and start Losartan 25 mg once by mouth daily.   Medications Discontinued During This Encounter  Medication Reason  . hydrochlorothiazide (HYDRODIURIL) 25 MG tablet Discontinued by provider    No orders of the defined types were placed in this encounter.  Meds ordered this encounter  Medications  . losartan (COZAAR) 25 MG tablet    Sig: Take 1 tablet (25 mg total) by mouth daily.    Dispense:  60 tablet    Refill:  0   He needs gallbladder surgery/ however wants to have near his home in [place] and is waiting. Denies any symptoms or acute flares. Managing with  diet.   Return in about 3 months (around 11/01/2019).     Advised patient call the office or your primary care doctor for an appointment if no improvement within 72 hours or if any symptoms change or worsen at any time  Advised ER or urgent Care if after hours or on weekend. Call 911 for emergency symptoms at any time.Patinet verbalized understanding of all instructions given/reviewed and treatment plan and has no further questions or concerns at this time.      IWellington Hampshire Field Staniszewski, FNP, have reviewed all documentation for this visit. The documentation on 08/01/19 for the exam, diagnosis, procedures, and orders are all accurate and complete.    Marcille Buffy, [place] [phone] (phone) [phone] (fax)  [place]

## 2019-08-01 NOTE — Patient Instructions (Addendum)
Try emollient cream over the counter such as Eucerin.    Gout  Gout is painful swelling of your joints. Gout is a type of arthritis. It is caused by having too much uric acid in your body. Uric acid is a chemical that is made when your body breaks down substances called purines. If your body has too much uric acid, sharp crystals can form and build up in your joints. This causes pain and swelling. Gout attacks can happen quickly and be very painful (acute gout). Over time, the attacks can affect more joints and happen more often (chronic gout). What are the causes?  Too much uric acid in your blood. This can happen because: ? Your kidneys do not remove enough uric acid from your blood. ? Your body makes too much uric acid. ? You eat too many foods that are high in purines. These foods include organ meats, some seafood, and beer.  Trauma or stress. What increases the risk?  Having a family history of gout.  Being male and middle-aged.  Being male and having gone through menopause.  Being very overweight (obese).  Drinking alcohol, especially beer.  Not having enough water in the body (being dehydrated).  Losing weight too quickly.  Having an organ transplant.  Having lead poisoning.  Taking certain medicines.  Having kidney disease.  Having a skin condition called psoriasis. What are the signs or symptoms? An attack of acute gout usually happens in just one joint. The most common place is the big toe. Attacks often start at night. Other joints that may be affected include joints of the feet, ankle, knee, fingers, wrist, or elbow. Symptoms of an attack may include:  Very bad pain.  Warmth.  Swelling.  Stiffness.  Shiny, red, or purple skin.  Tenderness. The affected joint may be very painful to touch.  Chills and fever. Chronic gout may cause symptoms more often. More joints may be involved. You may also have white or yellow lumps (tophi) on your hands or feet  or in other areas near your joints. How is this treated?  Treatment for this condition has two phases: treating an acute attack and preventing future attacks.  Acute gout treatment may include: ? NSAIDs. ? Steroids. These are taken by mouth or injected into a joint. ? Colchicine. This medicine relieves pain and swelling. It can be given by mouth or through an IV tube.  Preventive treatment may include: ? Taking small doses of NSAIDs or colchicine daily. ? Using a medicine that reduces uric acid levels in your blood. ? Making changes to your diet. You may need to see a food expert (dietitian) about what to eat and drink to prevent gout. Follow these instructions at home: During a gout attack   If told, put ice on the painful area: ? Put ice in a plastic bag. ? Place a towel between your skin and the bag. ? Leave the ice on for 20 minutes, 2-3 times a day.  Raise (elevate) the painful joint above the level of your heart as often as you can.  Rest the joint as much as possible. If the joint is in your leg, you may be given crutches.  Follow instructions from your doctor about what you cannot eat or drink. Avoiding future gout attacks  Eat a low-purine diet. Avoid foods and drinks such as: ? Liver. ? Kidney. ? Anchovies. ? Asparagus. ? Herring. ? Mushrooms. ? Mussels. ? Beer.  Stay at a healthy weight. If you  want to lose weight, talk with your doctor. Do not lose weight too fast.  Start or continue an exercise plan as told by your doctor. Eating and drinking  Drink enough fluids to keep your pee (urine) pale yellow.  If you drink alcohol: ? Limit how much you use to:  0-1 drink a day for women.  0-2 drinks a day for men. ? Be aware of how much alcohol is in your drink. In the [place] one drink equals one 12 oz bottle of beer (355 mL), one 5 oz glass of wine (148 mL), or one 1 oz glass of hard liquor (44 mL). General instructions  Take over-the-counter and  prescription medicines only as told by your doctor.  Do not drive or use heavy machinery while taking prescription pain medicine.  Return to your normal activities as told by your doctor. Ask your doctor what activities are safe for you.  Keep all follow-up visits as told by your doctor. This is important. Contact a doctor if:  You have another gout attack.  You still have symptoms of a gout attack after 10 days of treatment.  You have problems (side effects) because of your medicines.  You have chills or a fever.  You have burning pain when you pee (urinate).  You have pain in your lower back or belly. Get help right away if:  You have very bad pain.  Your pain cannot be controlled.  You cannot pee. Summary  Gout is painful swelling of the joints.  The most common site of pain is the big toe, but it can affect other joints.  Medicines and avoiding some foods can help to prevent and treat gout attacks. This information is not intended to replace advice given to you by your health care provider. Make sure you discuss any questions you have with your health care provider. Document Revised: 10/04/2017 Document Reviewed: 10/04/2017 Elsevier Patient Education  2020 Elsevier Inc.  Low-Purine Eating Plan A low-purine eating plan involves making food choices to limit your intake of purine. Purine is a kind of uric acid. Too much uric acid in your blood can cause certain conditions, such as gout and kidney stones. Eating a low-purine diet can help control these conditions. What are tips for following this plan? Reading food labels   Avoid foods with saturated or Trans fat.  Check the ingredient list of grains-based foods, such as bread and cereal, to make sure that they contain whole grains.  Check the ingredient list of sauces or soups to make sure they do not contain meat or fish.  When choosing soft drinks, check the ingredient list to make sure they do not contain  high-fructose corn syrup. Shopping  Buy plenty of fresh fruits and vegetables.  Avoid buying canned or fresh fish.  Buy dairy products labeled as low-fat or nonfat.  Avoid buying premade or processed foods. These foods are often high in fat, salt (sodium), and added sugar. Cooking  Use olive oil instead of butter when cooking. Oils like olive oil, canola oil, and sunflower oil contain healthy fats. Meal planning  Learn which foods do or do not affect you. If you find out that a food tends to cause your gout symptoms to flare up, avoid eating that food. You can enjoy foods that do not cause problems. If you have any questions about a food item, talk with your dietitian or health care provider.  Limit foods high in fat, especially saturated fat. Fat makes it harder  for your body to get rid of uric acid.  Choose foods that are lower in fat and are lean sources of protein. General guidelines  Limit alcohol intake to no more than 1 drink a day for nonpregnant women and 2 drinks a day for men. One drink equals 12 oz of beer, 5 oz of wine, or 1 oz of hard liquor. Alcohol can affect the way your body gets rid of uric acid.  Drink plenty of water to keep your urine clear or pale yellow. Fluids can help remove uric acid from your body.  If directed by your health care provider, take a vitamin C supplement.  Work with your health care provider and dietitian to develop a plan to achieve or maintain a healthy weight. Losing weight can help reduce uric acid in your blood. What foods are recommended? The items listed may not be a complete list. Talk with your dietitian about what dietary choices are best for you. Foods low in purines Foods low in purines do not need to be limited. These include:  All fruits.  All low-purine vegetables, pickles, and olives.  Breads, pasta, rice, cornbread, and popcorn. Cake and other baked goods.  All dairy foods.  Eggs, nuts, and nut butters.  Spices  and condiments, such as salt, herbs, and vinegar.  Plant oils, butter, and margarine.  Water, sugar-free soft drinks, tea, coffee, and cocoa.  Vegetable-based soups, broths, sauces, and gravies. Foods moderate in purines Foods moderate in purines should be limited to the amounts listed.   cup of asparagus, cauliflower, spinach, mushrooms, or green peas, each day.  2/3 cup uncooked oatmeal, each day.   cup dry wheat bran or wheat germ, each day.  2-3 ounces of meat or poultry, each day.  4-6 ounces of shellfish, such as crab, lobster, oysters, or shrimp, each day.  1 cup cooked beans, peas, or lentils, each day.  Soup, broths, or bouillon made from meat or fish. Limit these foods as much as possible. What foods are not recommended? The items listed may not be a complete list. Talk with your dietitian about what dietary choices are best for you. Limit your intake of foods high in purines, including:  Beer and other alcohol.  Meat-based gravy or sauce.  Canned or fresh fish, such as: ? Anchovies, sardines, herring, and tuna. ? Mussels and scallops. ? Codfish, trout, and haddock.  Tomasa Blase.  Organ meats, such as: ? Liver or kidney. ? Tripe. ? Sweetbreads (thymus gland or pancreas).  Wild Education officer, environmental.  Yeast or yeast extract supplements.  Drinks sweetened with high-fructose corn syrup. Summary  Eating a low-purine diet can help control conditions caused by too much uric acid in the body, such as gout or kidney stones.  Choose low-purine foods, limit alcohol, and limit foods high in fat.  You will learn over time which foods do or do not affect you. If you find out that a food tends to cause your gout symptoms to flare up, avoid eating that food. This information is not intended to replace advice given to you by your health care provider. Make sure you discuss any questions you have with your health care provider. Document Revised: 02/24/2017 Document Reviewed:  04/27/2016 Elsevier Patient Education  2020 Elsevier Inc.  Losartan Tablets What is this medicine? LOSARTAN (loe SAR tan) is an angiotensin II receptor blocker, also known as an ARB. It treats high blood pressure. It can slow kidney damage in some patients. It may also be  used to lower the risk of stroke. This medicine may be used for other purposes; ask your health care provider or pharmacist if you have questions. COMMON BRAND NAME(S): Cozaar What should I tell my health care provider before I take this medicine? They need to know if you have any of these conditions:  heart failure  kidney or liver disease  an unusual or allergic reaction to losartan, other medicines, foods, dyes, or preservatives  pregnant or trying to get pregnant  breast-feeding How should I use this medicine? Take this drug by mouth. Take it as directed on the prescription label at the same time every day. You can take it with or without food. If it upsets your stomach, take it with food. Keep taking it unless your health care provider tells you to stop. Talk to your health care provider about the use of this drug in children. While it may be prescribed for children as young as 6 for selected conditions, precautions do apply. Overdosage: If you think you have taken too much of this medicine contact a poison control center or emergency room at once. NOTE: This medicine is only for you. Do not share this medicine with others. What if I miss a dose? If you miss a dose, take it as soon as you can. If it is almost time for your next dose, take only that dose. Do not take double or extra doses. What may interact with this medicine?  blood pressure medicines  diuretics, especially triamterene, spironolactone, or amiloride  fluconazole  NSAIDs, medicines for pain and inflammation, like ibuprofen or naproxen  potassium salts or potassium supplements  rifampin This list may not describe all possible interactions.  Give your health care provider a list of all the medicines, herbs, non-prescription drugs, or dietary supplements you use. Also tell them if you smoke, drink alcohol, or use illegal drugs. Some items may interact with your medicine. What should I watch for while using this medicine? Visit your doctor or health care professional for regular checks on your progress. Check your blood pressure as directed. Ask your doctor or health care professional what your blood pressure should be and when you should contact him or her. Call your doctor or health care professional if you notice an irregular or fast heart beat. Women should inform their doctor if they wish to become pregnant or think they might be pregnant. There is a potential for serious side effects to an unborn child, particularly in the second or third trimester. Talk to your health care professional or pharmacist for more information. You may get drowsy or dizzy. Do not drive, use machinery, or do anything that needs mental alertness until you know how this drug affects you. Do not stand or sit up quickly, especially if you are an older patient. This reduces the risk of dizzy or fainting spells. Alcohol can make you more drowsy and dizzy. Avoid alcoholic drinks. Avoid salt substitutes unless you are told otherwise by your doctor or health care professional. Do not treat yourself for coughs, colds, or pain while you are taking this medicine without asking your doctor or health care professional for advice. Some ingredients may increase your blood pressure. What side effects may I notice from receiving this medicine? Side effects that you should report to your doctor or health care professional as soon as possible:  confusion, dizziness, light headedness or fainting spells  decreased amount of urine passed  difficulty breathing or swallowing, hoarseness, or tightening of the throat  fast or irregular heart beat, palpitations, or chest pain  skin  rash, itching  swelling of your face, lips, tongue, hands, or feet Side effects that usually do not require medical attention (report to your doctor or health care professional if they continue or are bothersome):  cough  decreased sexual function or desire  headache  nasal congestion or stuffiness  nausea or stomach pain  sore or cramping muscles This list may not describe all possible side effects. Call your doctor for medical advice about side effects. You may report side effects to FDA at 1-800-FDA-1088. Where should I keep my medicine? Keep out of the reach of children and pets. Store at room temperature between 15 and 30 degrees C (59 and 86 degrees F). Protect from light. Keep the container tightly closed. Throw away any unused drug after the expiration date. NOTE: This sheet is a summary. It may not cover all possible information. If you have questions about this medicine, talk to your doctor, pharmacist, or health care provider.  2020 Elsevier/Gold Standard (2018-10-17 12:12:28)

## 2019-08-21 DIAGNOSIS — M79641 Pain in right hand: Secondary | ICD-10-CM | POA: Diagnosis not present

## 2019-08-21 DIAGNOSIS — G5603 Carpal tunnel syndrome, bilateral upper limbs: Secondary | ICD-10-CM | POA: Diagnosis not present

## 2019-08-21 DIAGNOSIS — M79642 Pain in left hand: Secondary | ICD-10-CM | POA: Diagnosis not present

## 2019-08-26 ENCOUNTER — Encounter: Payer: Self-pay | Admitting: Neurology

## 2019-08-28 ENCOUNTER — Encounter: Payer: Self-pay | Admitting: Internal Medicine

## 2019-08-28 ENCOUNTER — Other Ambulatory Visit: Payer: Self-pay

## 2019-08-28 ENCOUNTER — Ambulatory Visit: Payer: BC Managed Care – PPO | Admitting: Internal Medicine

## 2019-08-28 VITALS — BP 136/86 | HR 82 | Ht 70.5 in | Wt 360.0 lb

## 2019-08-28 DIAGNOSIS — I10 Essential (primary) hypertension: Secondary | ICD-10-CM | POA: Diagnosis not present

## 2019-08-28 DIAGNOSIS — R0789 Other chest pain: Secondary | ICD-10-CM | POA: Diagnosis not present

## 2019-08-28 NOTE — Progress Notes (Signed)
Follow-up Outpatient Visit Date: 08/28/2019  Primary Care Provider: Doreen Beam, [address] [place] [address]  Chief Complaint: Follow-up chest pain  HPI:  Mr. Covello is a 44 y.o. male with history of hypertension, hypertriglyceridemia, and obstructive sleep apnea, who presents for follow-up of chest pain.  I met Mr. Castleman in March, at which time he noted sporadic central chest tightness with radiation to the left arm over the preceding year.  This was frequently accompanied by shortness of breath.  He felt like anxiety was contributing to his symptoms and had noted some improvement with use of CBD oil.  Today, Mr. Sofranko reports that he is feeling well.  He has not had any further chest pain or shortness of breath, palpitations, lightheadedness, or edema.  He is undergoing treatment for carpal tunnel syndrome in both arms and reports that the paresthesias in his arms and discomfort rating to his chest has resolved.  He is trying to exercise more and has also modified his diet in an effort to lose weight.  Home blood pressures are typically 1 52-7 35 systolic.  He is tolerating losartan well.  He did not do well with HCTZ, as it precipitated gout.  --------------------------------------------------------------------------------------------------  Cardiovascular History & Procedures: Cardiovascular Problems:  Chest pain and exertional dyspnea.  Risk Factors:  Hypertension, morbid obesity, prior tobacco use, and male gender  Cath/PCI:  None  CV Surgery:  None  EP Procedures and Devices:  None  Non-Invasive Evaluation(s):  Myocardial PET/CT (06/06/2019, [facility]): Low risk, probably normal myocardial perfusion stress test.  Small in size, subtle, fixed defect involving the apical inferior segment most consistent with artifact.  LVEF 61%.  Mild coronary artery calcification and gallstones noted.  Recent CV Pertinent Labs: Lab Results    Component Value Date   CHOL 138 04/22/2019   HDL 30 (L) 04/22/2019   LDLCALC 73 04/22/2019   TRIG 211 (H) 04/22/2019   CHOLHDL 4.6 04/22/2019   K 4.3 04/22/2019   BUN 11 04/22/2019   CREATININE 0.87 04/22/2019    Past medical and surgical history were reviewed and updated in EPIC.  Current Meds  Medication Sig   losartan (COZAAR) 25 MG tablet Take 1 tablet (25 mg total) by mouth daily.   UNABLE TO FIND CBD oil prn    Allergies: Patient has no known allergies.  Social History   Tobacco Use   Smoking status: Former Smoker    Packs/day: 0.25    Years: 0.50    Pack years: 0.12    Types: Cigarettes    Quit date: 07/29/1999    Years since quitting: 20.0   Smokeless tobacco: Former Systems developer    Types: Snuff    Quit date: 08/2018  Substance Use Topics   Alcohol use: Yes    Alcohol/week: 2.0 - 3.0 standard drinks    Types: 2 - 3 Cans of beer per week   Drug use: No    Family History  Problem Relation Age of Onset   Diabetes Mother    Diabetes Maternal Grandfather    Heart attack Father 26    Review of Systems: A 12-system review of systems was performed and was negative except as noted in the HPI.  --------------------------------------------------------------------------------------------------  Physical Exam: BP 136/86 (BP Location: Left Arm, Patient Position: Sitting, Cuff Size: Large)    Pulse 82    Ht 5' 10.5" (1.791 m)    Wt (!) 360 lb (163.3 kg)    SpO2 98%  BMI 50.92 kg/m   General: NAD. Neck: No JVD or HJR, though evaluation is limited by body habitus. Lungs: Clear to auscultation without wheezes or crackles. Heart: Distant heart sounds.  Regular rate and rhythm without murmurs. Abdomen: Obese but soft.  Nontender/nondistended. Extremities: No lower extremity edema.  EKG: Normal sinus rhythm with left axis deviation.  Otherwise, no significant abnormality.  Lab Results  Component Value Date   WBC 7.1 04/22/2019   HGB 14.8 04/22/2019   HCT  43.6 04/22/2019   MCV 85 04/22/2019   PLT 281 04/22/2019    Lab Results  Component Value Date   NA 140 04/22/2019   K 4.3 04/22/2019   CL 101 04/22/2019   CO2 24 04/22/2019   BUN 11 04/22/2019   CREATININE 0.87 04/22/2019   GLUCOSE 95 04/22/2019   ALT 42 04/22/2019    Lab Results  Component Value Date   CHOL 138 04/22/2019   HDL 30 (L) 04/22/2019   LDLCALC 73 04/22/2019   TRIG 211 (H) 04/22/2019   CHOLHDL 4.6 04/22/2019    --------------------------------------------------------------------------------------------------  ASSESSMENT AND PLAN: Atypical chest pain: Symptoms have resolved.  Myocardial perfusion stress test in March at [facility] was low risk.  No further intervention is recommended at this time.  No mild coronary artery calcification was noted, most recent LDL was quite good at 78.  We have agreed to work on lifestyle modifications regimen and statin therapy.  Hypertension: Blood pressure borderline today but typically better at home.  Mr. Torr should continue his current dose of losartan and work on lifestyle modifications, including weight loss and sodium restriction.  Morbid obesity: Mr. Yohn is motivated to lose weight through diet and exercise.  I have encouraged him to continue with this.  Follow-up: Return to clinic in 1 year.  Nelva Bush, MD 08/28/2019 4:26 PM

## 2019-08-28 NOTE — Patient Instructions (Signed)
Medication Instructions:  Your physician recommends that you continue on your current medications as directed. Please refer to the Current Medication list given to you today.  *If you need a refill on your cardiac medications before your next appointment, please call your pharmacy*   Lab Work: None ordered If you have labs (blood work) drawn today and your tests are completely normal, you will receive your results only by: . MyChart Message (if you have MyChart) OR . A paper copy in the mail If you have any lab test that is abnormal or we need to change your treatment, we will call you to review the results.   Testing/Procedures: None ordered   Follow-Up: At [facility], you and your health needs are our priority.  As part of our continuing mission to provide you with exceptional heart care, we have created designated Provider Care Teams.  These Care Teams include your primary Cardiologist (physician) and Advanced Practice Providers (APPs -  Physician Assistants and Nurse Practitioners) who all work together to provide you with the care you need, when you need it.  We recommend signing up for the patient portal called "MyChart".  Sign up information is provided on this After Visit Summary.  MyChart is used to connect with patients for Virtual Visits (Telemedicine).  Patients are able to view lab/test results, encounter notes, upcoming appointments, etc.  Non-urgent messages can be sent to your provider as well.   To learn more about what you can do with MyChart, go to [email or web address].    Your next appointment:   12 month(s)  The format for your next appointment:   In Person  Provider:    You may see Dr. End or one of the following Advanced Practice Providers on your designated Care Team:    Christopher Berge, NP  Ryan Dunn, PA-C  Jacquelyn Visser, PA-C    Other Instructions N/A  

## 2019-08-29 ENCOUNTER — Encounter: Payer: Self-pay | Admitting: Internal Medicine

## 2019-09-06 ENCOUNTER — Other Ambulatory Visit: Payer: Self-pay

## 2019-09-06 ENCOUNTER — Ambulatory Visit
Admission: EM | Admit: 2019-09-06 | Discharge: 2019-09-06 | Discharge status: Absent without leave | Payer: BC Managed Care – PPO

## 2019-09-08 DIAGNOSIS — R05 Cough: Secondary | ICD-10-CM | POA: Diagnosis not present

## 2019-09-08 DIAGNOSIS — M545 Low back pain: Secondary | ICD-10-CM | POA: Diagnosis not present

## 2019-09-20 ENCOUNTER — Ambulatory Visit: Payer: BC Managed Care – PPO | Admitting: Adult Health

## 2019-09-23 ENCOUNTER — Telehealth: Payer: Self-pay | Admitting: Adult Health

## 2019-09-23 DIAGNOSIS — M79641 Pain in right hand: Secondary | ICD-10-CM | POA: Diagnosis not present

## 2019-09-23 DIAGNOSIS — G5603 Carpal tunnel syndrome, bilateral upper limbs: Secondary | ICD-10-CM | POA: Diagnosis not present

## 2019-09-23 NOTE — Telephone Encounter (Signed)
[facility] faxed refill request for the following medications:  losartan (COZAAR) 25 MG tablet  Please advise.  Thanks, [employer]

## 2019-09-25 ENCOUNTER — Ambulatory Visit (INDEPENDENT_AMBULATORY_CARE_PROVIDER_SITE_OTHER): Payer: BC Managed Care – PPO | Admitting: Adult Health

## 2019-09-25 ENCOUNTER — Telehealth: Payer: Self-pay | Admitting: Adult Health

## 2019-09-25 ENCOUNTER — Other Ambulatory Visit: Payer: Self-pay

## 2019-09-25 ENCOUNTER — Encounter: Payer: Self-pay | Admitting: Adult Health

## 2019-09-25 VITALS — BP 118/68 | HR 79 | Temp 97.3°F | Resp 16 | Wt 370.8 lb

## 2019-09-25 DIAGNOSIS — I10 Essential (primary) hypertension: Secondary | ICD-10-CM | POA: Diagnosis not present

## 2019-09-25 DIAGNOSIS — Z6841 Body Mass Index (BMI) 40.0 and over, adult: Secondary | ICD-10-CM

## 2019-09-25 MED ORDER — LOSARTAN POTASSIUM 25 MG PO TABS: 25.0000 mg | ORAL_TABLET | Freq: Every day | ORAL | 1 refills | Status: DC

## 2019-09-25 NOTE — Patient Instructions (Addendum)
Managing Your Hypertension Hypertension is commonly called high blood pressure. This is when the force of your blood pressing against the walls of your arteries is too strong. Arteries are blood vessels that carry blood from your heart throughout your body. Hypertension forces the heart to work harder to pump blood, and may cause the arteries to become narrow or stiff. Having untreated or uncontrolled hypertension can cause heart attack, stroke, kidney disease, and other problems. What are blood pressure readings? A blood pressure reading consists of a higher number over a lower number. Ideally, your blood pressure should be below 120/80. The first ("top") number is called the systolic pressure. It is a measure of the pressure in your arteries as your heart beats. The second ("bottom") number is called the diastolic pressure. It is a measure of the pressure in your arteries as the heart relaxes. What does my blood pressure reading mean? Blood pressure is classified into four stages. Based on your blood pressure reading, your health care provider may use the following stages to determine what type of treatment you need, if any. Systolic pressure and diastolic pressure are measured in a unit called mm Hg. Normal  Systolic pressure: below 120.  Diastolic pressure: below 80. Elevated  Systolic pressure: 120-129.  Diastolic pressure: below 80. Hypertension stage 1  Systolic pressure: 130-139.  Diastolic pressure: 80-89. Hypertension stage 2  Systolic pressure: 140 or above.  Diastolic pressure: 90 or above. What health risks are associated with hypertension? Managing your hypertension is an important responsibility. Uncontrolled hypertension can lead to:  A heart attack.  A stroke.  A weakened blood vessel (aneurysm).  Heart failure.  Kidney damage.  Eye damage.  Metabolic syndrome.  Memory and concentration problems. What changes can I make to manage my  hypertension? Hypertension can be managed by making lifestyle changes and possibly by taking medicines. Your health care provider will help you make a plan to bring your blood pressure within a normal range. Eating and drinking   Eat a diet that is high in fiber and potassium, and low in salt (sodium), added sugar, and fat. An example eating plan is called the DASH (Dietary Approaches to Stop Hypertension) diet. To eat this way: ? Eat plenty of fresh fruits and vegetables. Try to fill half of your plate at each meal with fruits and vegetables. ? Eat whole grains, such as whole wheat pasta, brown rice, or whole grain bread. Fill about one quarter of your plate with whole grains. ? Eat low-fat diary products. ? Avoid fatty cuts of meat, processed or cured meats, and poultry with skin. Fill about one quarter of your plate with lean proteins such as fish, chicken without skin, beans, eggs, and tofu. ? Avoid premade and processed foods. These tend to be higher in sodium, added sugar, and fat.  Reduce your daily sodium intake. Most people with hypertension should eat less than 1,500 mg of sodium a day.  Limit alcohol intake to no more than 1 drink a day for nonpregnant women and 2 drinks a day for men. One drink equals 12 oz of beer, 5 oz of wine, or 1 oz of hard liquor. Lifestyle  Work with your health care provider to maintain a healthy body weight, or to lose weight. Ask what an ideal weight is for you.  Get at least 30 minutes of exercise that causes your heart to beat faster (aerobic exercise) most days of the week. Activities may include walking, swimming, or biking.  Include exercise   to strengthen your muscles (resistance exercise), such as weight lifting, as part of your weekly exercise routine. Try to do these types of exercises for 30 minutes at least 3 days a week.  Do not use any products that contain nicotine or tobacco, such as cigarettes and e-cigarettes. If you need help quitting,  ask your health care provider.  Control any long-term (chronic) conditions you have, such as high cholesterol or diabetes. Monitoring  Monitor your blood pressure at home as told by your health care provider. Your personal target blood pressure may vary depending on your medical conditions, your age, and other factors.  Have your blood pressure checked regularly, as often as told by your health care provider. Working with your health care provider  Review all the medicines you take with your health care provider because there may be side effects or interactions.  Talk with your health care provider about your diet, exercise habits, and other lifestyle factors that may be contributing to hypertension.  Visit your health care provider regularly. Your health care provider can help you create and adjust your plan for managing hypertension. Will I need medicine to control my blood pressure? Your health care provider may prescribe medicine if lifestyle changes are not enough to get your blood pressure under control, and if:  Your systolic blood pressure is 130 or higher.  Your diastolic blood pressure is 80 or higher. Take medicines only as told by your health care provider. Follow the directions carefully. Blood pressure medicines must be taken as prescribed. The medicine does not work as well when you skip doses. Skipping doses also puts you at risk for problems. Contact a health care provider if:  You think you are having a reaction to medicines you have taken.  You have repeated (recurrent) headaches.  You feel dizzy.  You have swelling in your ankles.  You have trouble with your vision. Get help right away if:  You develop a severe headache or confusion.  You have unusual weakness or numbness, or you feel faint.  You have severe pain in your chest or abdomen.  You vomit repeatedly.  You have trouble breathing. Summary  Hypertension is when the force of blood pumping  through your arteries is too strong. If this condition is not controlled, it may put you at risk for serious complications.  Your personal target blood pressure may vary depending on your medical conditions, your age, and other factors. For most people, a normal blood pressure is less than 120/80.  Hypertension is managed by lifestyle changes, medicines, or both. Lifestyle changes include weight loss, eating a healthy, low-sodium diet, exercising more, and limiting alcohol. This information is not intended to replace advice given to you by your health care provider. Make sure you discuss any questions you have with your health care provider. Document Revised: 07/06/2018 Document Reviewed: 02/10/2016 Elsevier Patient Education  2020 [employer].    Healthy Eating Following a healthy eating pattern may help you to achieve and maintain a healthy body weight, reduce the risk of chronic disease, and live a long and productive life. It is important to follow a healthy eating pattern at an appropriate calorie level for your body. Your nutritional needs should be met primarily through food by choosing a variety of nutrient-rich foods. What are tips for following this plan? Reading food labels  Read labels and choose the following: ? Reduced or low sodium. ? Juices with 100% fruit juice. ? Foods with low saturated fats and high  polyunsaturated and monounsaturated fats. ? Foods with whole grains, such as whole wheat, cracked wheat, brown rice, and wild rice. ? Whole grains that are fortified with folic acid. This is recommended for women who are pregnant or who want to become pregnant.  Read labels and avoid the following: ? Foods with a lot of added sugars. These include foods that contain brown sugar, corn sweetener, corn syrup, dextrose, fructose, glucose, high-fructose corn syrup, honey, invert sugar, lactose, malt syrup, maltose, molasses, raw sugar, sucrose, trehalose, or turbinado sugar.  Do  not eat more than the following amounts of added sugar per day:  6 teaspoons (25 g) for women.  9 teaspoons (38 g) for men. ? Foods that contain processed or refined starches and grains. ? Refined grain products, such as white flour, degermed cornmeal, white bread, and white rice. Shopping  Choose nutrient-rich snacks, such as vegetables, whole fruits, and nuts. Avoid high-calorie and high-sugar snacks, such as potato chips, fruit snacks, and candy.  Use oil-based dressings and spreads on foods instead of solid fats such as butter, stick margarine, or cream cheese.  Limit pre-made sauces, mixes, and "instant" products such as flavored rice, instant noodles, and ready-made pasta.  Try more plant-protein sources, such as tofu, tempeh, black beans, edamame, lentils, nuts, and seeds.  Explore eating plans such as the Mediterranean diet or vegetarian diet. Cooking  Use oil to saut or stir-fry foods instead of solid fats such as butter, stick margarine, or lard.  Try baking, boiling, grilling, or broiling instead of frying.  Remove the fatty part of meats before cooking.  Steam vegetables in water or broth. Meal planning   At meals, imagine dividing your plate into fourths: ? One-half of your plate is fruits and vegetables. ? One-fourth of your plate is whole grains. ? One-fourth of your plate is protein, especially lean meats, poultry, eggs, tofu, beans, or nuts.  Include low-fat dairy as part of your daily diet. Lifestyle  Choose healthy options in all settings, including home, work, school, restaurants, or stores.  Prepare your food safely: ? Wash your hands after handling raw meats. ? Keep food preparation surfaces clean by regularly washing with hot, soapy water. ? Keep raw meats separate from ready-to-eat foods, such as fruits and vegetables. ? Cook seafood, meat, poultry, and eggs to the recommended internal temperature. ? Store foods at safe temperatures. In  general:  Keep cold foods at 60F (4.4C) or below.  Keep hot foods at 160F (60C) or above.  Keep your freezer at [facility] (-17.8C) or below.  Foods are no longer safe to eat when they have been between the temperatures of 40-160F (4.4-60C) for more than 2 hours. What foods should I eat? Fruits Aim to eat 2 cup-equivalents of fresh, canned (in natural juice), or frozen fruits each day. Examples of 1 cup-equivalent of fruit include 1 small apple, 8 large strawberries, 1 cup canned fruit,  cup dried fruit, or 1 cup 100% juice. Vegetables Aim to eat 2-3 cup-equivalents of fresh and frozen vegetables each day, including different varieties and colors. Examples of 1 cup-equivalent of vegetables include 2 medium carrots, 2 cups raw, leafy greens, 1 cup chopped vegetable (raw or cooked), or 1 medium baked potato. Grains Aim to eat 6 ounce-equivalents of whole grains each day. Examples of 1 ounce-equivalent of grains include 1 slice of bread, 1 cup ready-to-eat cereal, 3 cups popcorn, or  cup cooked rice, pasta, or cereal. Meats and other proteins Aim to eat 5-6 ounce-equivalents of  protein each day. Examples of 1 ounce-equivalent of protein include 1 egg, 1/2 cup nuts or seeds, or 1 tablespoon (16 g) peanut butter. A cut of meat or fish that is the size of a deck of cards is about 3-4 ounce-equivalents.  Of the protein you eat each week, try to have at least 8 ounces come from seafood. This includes salmon, trout, herring, and anchovies. Dairy Aim to eat 3 cup-equivalents of fat-free or low-fat dairy each day. Examples of 1 cup-equivalent of dairy include 1 cup (240 mL) milk, 8 ounces (250 g) yogurt, 1 ounces (44 g) natural cheese, or 1 cup (240 mL) fortified soy milk. Fats and oils  Aim for about 5 teaspoons (21 g) per day. Choose monounsaturated fats, such as canola and olive oils, avocados, peanut butter, and most nuts, or polyunsaturated fats, such as sunflower, corn, and soybean oils,  walnuts, pine nuts, sesame seeds, sunflower seeds, and flaxseed. Beverages  Aim for six 8-oz glasses of water per day. Limit coffee to three to five 8-oz cups per day.  Limit caffeinated beverages that have added calories, such as soda and energy drinks.  Limit alcohol intake to no more than 1 drink a day for nonpregnant women and 2 drinks a day for men. One drink equals 12 oz of beer (355 mL), 5 oz of wine (148 mL), or 1 oz of hard liquor (44 mL). Seasoning and other foods  Avoid adding excess amounts of salt to your foods. Try flavoring foods with herbs and spices instead of salt.  Avoid adding sugar to foods.  Try using oil-based dressings, sauces, and spreads instead of solid fats. This information is based on general [place] nutrition guidelines. For more information, visit BuildDNA.es. Exact amounts may vary based on your nutrition needs. Summary  A healthy eating plan may help you to maintain a healthy weight, reduce the risk of chronic diseases, and stay active throughout your life.  Plan your meals. Make sure you eat the right portions of a variety of nutrient-rich foods.  Try baking, boiling, grilling, or broiling instead of frying.  Choose healthy options in all settings, including home, work, school, restaurants, or stores. This information is not intended to replace advice given to you by your health care provider. Make sure you discuss any questions you have with your health care provider. Document Revised: 06/26/2017 Document Reviewed: 06/26/2017 Elsevier Patient Education  [facility], 44(10), 748. [email or web address]">  How to Increase Your Level of Physical Activity Getting regular physical activity is important for your overall health and well-being. Most people do not get enough exercise. There are easy ways to increase your level of physical activity, even if you have not been  very active in the past or if you are just starting out. How can increasing my physical activity affect me? Physical activity has many short-term and long-term benefits. Being active on a regular basis can improve your physical and mental health as well as provide other benefits. Physical health benefits  Helping you lose weight or maintain a healthy weight.  Strengthening your muscles and bones.  Reducing your risk of certain long-term (chronic) diseases, including heart disease, cancer, and diabetes.  Being able to move around more easily and for longer periods of time without getting tired (increased stamina).  Improving your ability to fight off illness (enhanced immunity).  Being able to sleep better.  Helping you stay healthy as you get older, including: ?  Helping you stay mobile, or capable of walking and moving around. ? Preventing accidents, such as falls. ? Increasing life expectancy. Mental health benefits  Boosting your mood and improving your self-esteem.  Lowering your chance of having mental health problems, such as depression or anxiety.  Helping you feel good about your body. Other benefits  Finding new sources of fun and enjoyment.  Meeting new people who share a common interest. What steps can I take to be more physically active? Getting started  If you have a chronic illness or have not been active for a while, check with your health care provider about how to get started. Ask your health care provider what activities are safe for you.  Start out slowly. Walking or doing some simple chair exercises is a good place to start, especially if you have not been active before or for a long time.  Set goals that you can work toward. Ask your health care provider how much exercise is best for you. In general, most adults should: ? Do moderate-intensity exercise for at least 150 minutes each week (30 minutes on most days of the week) or vigorous exercise for at  least 75 minutes each week, or a combination of these.  Moderate-intensity exercise can include walking at a quick pace, biking, yoga, water aerobics, or gardening.  Vigorous exercise involves activities that take more effort, such as jogging or running, playing sports, swimming laps, or jumping rope. ? Do strength exercises on at least 2 days each week. This can include weight lifting, body weight exercises, and resistance-band exercises.  Consider using a fitness tracker, such as a mobile phone app or a device worn like a watch, that will count the number of steps you take each day. Many people strive to reach 10,000 steps a day. Choosing activities  Try to find activities that you enjoy. You are more likely to commit to an exercise routine if it does not feel like a chore.  If you have bone or joint problems, choose low-impact exercises, like walking or swimming.  Use these tips for being successful with an exercise plan: ? Find a workout partner for accountability. ? Join a group or class, such as an aerobics class, cycling class, or sports team. ? Make family time active. Go for a walk, bike, or swim. ? Include a variety of exercises each week. Being active in your daily routines Besides your formal exercise plans, you can find ways to do physical activity during your daily routines, such as:  Walking or biking to work or to the store.  Taking the stairs instead of the elevator.  Parking farther away from the door at work or at the store.  Planning walking meetings.  Walking around while you are on the phone.  Where to find more information  Centers for Disease Control and Prevention: WorkDashboard.es  President's Council on Fitness, Sports & Nutrition: www.fitness.gov  ChooseMyPlate: FormerBoss.no Contact a health care provider if:  You have headaches, muscle aches, or joint pain.  You feel dizzy or light-headed while exercising.  You  faint.  You have chest pain while exercising. Summary  Exercise benefits your mind and body at any age, even if you are just starting out.  If you have a chronic illness or have not been active for a while, check with your health care provider before increasing your physical activity.  Choose activities that are safe and enjoyable for you. Ask your health care provider what activities are safe for  you.  Start slowly. Tell your health care provider if you have problems as you start to increase your activity level. This information is not intended to replace advice given to you by your health care provider. Make sure you discuss any questions you have with your health care provider. Document Revised: 12/17/2018 Document Reviewed: 10/08/2018 Elsevier Patient Education  [place].

## 2019-09-25 NOTE — Telephone Encounter (Signed)
[facility] faxed refill request for the following medications:  Losartan Potassium 25 mg TAB  Last Rx: 08/01/2019 no refills LOV: 09/25/2019 (today) Please advise. Thanks TNP

## 2019-09-25 NOTE — Telephone Encounter (Signed)
Patient seen in office today and prescription has been filled. KW

## 2019-09-25 NOTE — Telephone Encounter (Signed)
Patient seen in office and prescription filled. KW

## 2019-09-25 NOTE — Progress Notes (Signed)
Established patient visit   Patient: Steve [name]   DOB: 1975/11/14   44 y.o. Male  MRN: [id number] Visit Date: 09/25/2019  Today's healthcare provider: Jairo Ben, FNP  Susa Griffins Suhail Peloquin,acting as a scribe for Beverely Pace Zeya Balles, FNP.,have documented all relevant documentation on the behalf of Jairo Ben, FNP,as directed by  Jairo Ben, FNP while in the presence of Jairo Ben, FNP. Chief Complaint  Patient presents with  . Hypertension   Subjective    HPI  Hypertension, follow-up  BP Readings from Last 3 Encounters:  09/25/19 118/68  08/28/19 136/86  08/01/19 (!) 144/90   Wt Readings from Last 3 Encounters:  09/25/19 (!) 370 lb 12.8 oz (168.2 kg)  08/28/19 (!) 360 lb (163.3 kg)  08/01/19 (!) 358 lb 6.4 oz (162.6 kg)     He was last seen for hypertension 1 months ago.  BP at that visit was 144/90. Management since that visit includes discontinuing HCTZ and starting patient on Losartan 25mg  due to gout.  He has also seen cardiology and had work up that was negative. Denies any further chest pain he feels was likely due to covid isolation in past working at home. He has improved anxiety now and declined need for medication. He will let provider know if that changes.   He reports excellent compliance with treatment. He is not having side effects.  He is following a Regular diet. Patient reports that he decreased to 1 liter of soda a day from 2. He is exercising, patient reports that his working on becoming more active and is starting to walk daily.  He does not smoke.  Use of agents associated with hypertension: none.   Outside blood pressures are systolic in the 140s an diastolic 70s. Symptoms: No chest pain No chest pressure  No palpitations No syncope  No dyspnea No orthopnea  No paroxysmal nocturnal dyspnea No lower extremity edema   Pertinent labs: Lab Results  Component Value Date   CHOL  138 04/22/2019   HDL 30 (L) 04/22/2019   LDLCALC 73 04/22/2019   TRIG 211 (H) 04/22/2019   CHOLHDL 4.6 04/22/2019   Lab Results  Component Value Date   NA 140 04/22/2019   K 4.3 04/22/2019   CREATININE 0.87 04/22/2019   GFRNONAA 106 04/22/2019   GFRAA 123 04/22/2019   GLUCOSE 95 04/22/2019     The 10-year ASCVD risk score 04/24/2019 DC Jr., et al., 2013) is: 1.6%   Patient reports that he did receive his 2nd covid vaccine in March 2021.   Patient  denies any fever, body aches,chills, rash, chest pain, shortness of breath, nausea, vomiting, or diarrhea.  Denies dizziness, lightheadedness, pre syncopal or syncopal episodes.   ---------------------------------------------------------------------------------------------------  Patient Active Problem List   Diagnosis Date Noted  . Calculus of gallbladder without cholecystitis without obstruction 06/20/2019  . Atypical chest pain 05/28/2019  . Shortness of breath 05/28/2019  . Abnormal EKG 05/28/2019  . High triglycerides 05/23/2019  . History of cholelithiasis 05/23/2019  . Hypertension 05/23/2019  . Morbid obesity (HCC) 04/22/2019  . Use of cannabis oil 04/22/2019  . Fatigue 04/22/2019  . Encounter for annual physical exam 04/22/2019  . RUQ abdominal pain 04/22/2019  . Paresthesia of both hands 04/22/2019  . Bilateral wrist pain 04/22/2019  . Class 3 severe obesity due to excess calories with body mass index (BMI) of 45.0 to 49.9 in adult [facility]) 04/22/2019  . History of chest pain 04/22/2019  .  Anxiety 04/22/2019  . Family history of coronary artery disease 04/22/2019   Past Medical History:  Diagnosis Date  . Gall bladder inflammation   . Hypertension   . Obstructive sleep apnea    Social History   Tobacco Use  . Smoking status: Former Smoker    Packs/day: 0.25    Years: 0.50    Pack years: 0.12    Types: Cigarettes    Quit date: 07/29/1999    Years since quitting: 20.1  . Smokeless tobacco: Former Neurosurgeon    Types:  Snuff    Quit date: 08/2018  Vaping Use  . Vaping Use: Never used  Substance Use Topics  . Alcohol use: Yes    Alcohol/week: 2.0 - 3.0 standard drinks    Types: 2 - 3 Cans of beer per week  . Drug use: No   No Known Allergies     Medications: Outpatient Medications Prior to Visit  Medication Sig  . [DISCONTINUED] losartan (COZAAR) 25 MG tablet Take 1 tablet (25 mg total) by mouth daily.  Marland Kitchen UNABLE TO FIND CBD oil prn (Patient not taking: Reported on 09/25/2019)   No facility-administered medications prior to visit.    Review of Systems  Constitutional: Positive for activity change and unexpected weight change (not unexpected just has not been on tract with diet changes and activity ). Negative for appetite change, chills, diaphoresis, fatigue and fever.  HENT: Negative.   Respiratory: Negative.        Cough one week ago resolved was non productive.   Cardiovascular: Negative.   Gastrointestinal:       Further gallbladder attacks. Doing well with diet management denies any abdominal pain.   Genitourinary: Negative.   Musculoskeletal: Negative.   Neurological: Positive for numbness (bilateral carpal tunnel has seen Dr. Amanda Pea post refferal and recieved steroid shot and is doing better trying braces at night and conservative care versus surgery at present ).  Psychiatric/Behavioral: Negative.     Last metabolic panel Lab Results  Component Value Date   GLUCOSE 95 04/22/2019   NA 140 04/22/2019   K 4.3 04/22/2019   CL 101 04/22/2019   CO2 24 04/22/2019   BUN 11 04/22/2019   CREATININE 0.87 04/22/2019   GFRNONAA 106 04/22/2019   GFRAA 123 04/22/2019   CALCIUM 9.0 04/22/2019   PROT 7.3 04/22/2019   ALBUMIN 4.1 04/22/2019   LABGLOB 3.2 04/22/2019   AGRATIO 1.3 04/22/2019   BILITOT 0.7 04/22/2019   ALKPHOS 62 04/22/2019   AST 29 04/22/2019   ALT 42 04/22/2019   ANIONGAP 8 03/26/2019   Last lipids Lab Results  Component Value Date   CHOL 138 04/22/2019   HDL 30 (L)  04/22/2019   LDLCALC 73 04/22/2019   TRIG 211 (H) 04/22/2019   CHOLHDL 4.6 04/22/2019   Last hemoglobin A1c No results found for: HGBA1C Last thyroid functions Lab Results  Component Value Date   TSH 2.860 04/22/2019      Objective    BP 118/68   Pulse 79   Temp (!) 97.3 F (36.3 C) (Oral)   Resp 16   Wt (!) 370 lb 12.8 oz (168.2 kg)   SpO2 98%   BMI 52.45 kg/m  BP Readings from Last 3 Encounters:  09/25/19 118/68  08/28/19 136/86  08/01/19 (!) 144/90      Physical Exam Vitals and nursing note reviewed. Exam conducted with a chaperone present.  Constitutional:      General: He is not in acute distress.  Appearance: Normal appearance. He is well-developed. He is obese. He is not ill-appearing, toxic-appearing or diaphoretic.     Comments: Patient is alert and oriented and responsive to questions Engages in eye contact with provider. Speaks in full sentences without any pauses without any shortness of breath or distress.    HENT:     Head: Normocephalic and atraumatic.     Right Ear: Hearing, tympanic membrane, ear canal and external ear normal.     Left Ear: Hearing, tympanic membrane, ear canal and external ear normal.     Nose: Nose normal.     Mouth/Throat:     Pharynx: Uvula midline. No oropharyngeal exudate.  Eyes:     General: Lids are normal. No scleral icterus.       Right eye: No discharge.        Left eye: No discharge.     Conjunctiva/sclera: Conjunctivae normal.     Pupils: Pupils are equal, round, and reactive to light.  Neck:     Thyroid: No thyromegaly.     Vascular: Normal carotid pulses. No carotid bruit, hepatojugular reflux or JVD.     Trachea: Trachea and phonation normal. No tracheal tenderness or tracheal deviation.     Meningeal: Brudzinski's sign absent.  Cardiovascular:     Rate and Rhythm: Normal rate and regular rhythm.     Pulses: Normal pulses.     Heart sounds: Normal heart sounds, S1 normal and S2 normal. Heart sounds not  distant. No murmur heard.  No friction rub. No gallop.   Pulmonary:     Effort: Pulmonary effort is normal. No accessory muscle usage or respiratory distress.     Breath sounds: Normal breath sounds. No stridor. No wheezing, rhonchi or rales.  Chest:     Chest wall: No tenderness.  Abdominal:     General: There is no distension.     Palpations: Abdomen is soft.  Musculoskeletal:        General: No tenderness or deformity. Normal range of motion.     Cervical back: Full passive range of motion without pain, normal range of motion and neck supple.     Comments: Patient moves on and off of exam table and in room without difficulty. Gait is normal in hall and in room. Patient is oriented to person place time and situation. Patient answers questions appropriately and engages in conversation.   Lymphadenopathy:     Head:     Right side of head: No submental, submandibular, tonsillar, preauricular, posterior auricular or occipital adenopathy.     Left side of head: No submental, submandibular, tonsillar, preauricular, posterior auricular or occipital adenopathy.     Cervical: No cervical adenopathy.  Skin:    General: Skin is warm and dry.     Capillary Refill: Capillary refill takes less than 2 seconds.     Coloration: Skin is not pale.     Findings: No erythema or rash.     Nails: There is no clubbing.  Neurological:     Mental Status: He is alert and oriented to person, place, and time.     GCS: GCS eye subscore is 4. GCS verbal subscore is 5. GCS motor subscore is 6.     Motor: No abnormal muscle tone.     Coordination: Coordination normal.     Gait: Gait normal.     Deep Tendon Reflexes: Reflexes are normal and symmetric.  Psychiatric:        Mood and Affect: Mood normal.  Speech: Speech normal.        Behavior: Behavior normal.        Thought Content: Thought content normal.        Judgment: Judgment normal.     No results found for any visits on 09/25/19.  Assessment  & Plan     1. Hypertension, unspecified type Return back to office in 6 months, continue Losartan 25mg  daily and keep log of blood pressure readings.  - losartan (COZAAR) 25 MG tablet; Take 1 tablet (25 mg total) by mouth daily.  Dispense: 90 tablet; Refill: 1  He has orders for labs in place and will go for those in two weeks when he returns from vacation.   2. Morbid obesity with BMI 50.0-59.9, adult (HCC) Continue to work on lifestyle changes including diet and exercise, have discussed today [facility] and nutritionist he prefers to try and work on healthy lifestyle himself again for a while.  He will let provider know should he want a referral.   Increase water intake, he has made significant changes in cutting out soda, increase activity.  The patient is advised to begin progressive daily aerobic exercise program, attempt to lose weight, decrease or avoid alcohol intake, reduce exposure to stress, attend health education classes for weight control, diet and cholesterol, exercise and stress reduction, continue current medications, continue current healthy lifestyle patterns and return for routine annual checkups. Return in about 6 months (around 03/26/2020) for Hypertenstion.  Advised patient call the office or your primary care doctor for an appointment if no improvement within 72 hours or if any symptoms change or worsen at any time  Advised ER or urgent Care if after hours or on weekend. Call 911 for emergency symptoms at any time.Patinet verbalized understanding of all instructions given/reviewed and treatment plan and has no further questions or concerns at this time.      I01/03/2020 Thang Flett, FNP, have reviewed all documentation for this visit. The documentation on 09/25/19 for the exam, diagnosis, procedures, and orders are all accurate and complete.   09/27/19, FNP  [facility] [phone] (phone) [phone] (fax)  [facility]

## 2019-12-31 DIAGNOSIS — Z23 Encounter for immunization: Secondary | ICD-10-CM | POA: Diagnosis not present

## 2020-01-02 ENCOUNTER — Other Ambulatory Visit: Payer: Self-pay

## 2020-01-02 ENCOUNTER — Ambulatory Visit
Admission: EM | Admit: 2020-01-02 | Discharge: 2020-01-02 | Disposition: A | Discharge status: Home / Self Care | Payer: BC Managed Care – PPO | Attending: Internal Medicine | Admitting: Internal Medicine

## 2020-01-02 DIAGNOSIS — R600 Localized edema: Secondary | ICD-10-CM | POA: Diagnosis not present

## 2020-01-02 LAB — CBC WITH DIFFERENTIAL/PLATELET
Abs Immature Granulocytes: 0.02 10*3/uL (ref 0.00–0.07)
Basophils Absolute: 0 10*3/uL (ref 0.0–0.1)
Basophils Relative: 1 %
Eosinophils Absolute: 0.1 10*3/uL (ref 0.0–0.5)
Eosinophils Relative: 1 %
HCT: 42.1 % (ref 39.0–52.0)
Hemoglobin: 13.7 g/dL (ref 13.0–17.0)
Immature Granulocytes: 0 %
Lymphocytes Relative: 24 %
Lymphs Abs: 1.5 10*3/uL (ref 0.7–4.0)
MCH: 28.4 pg (ref 26.0–34.0)
MCHC: 32.5 g/dL (ref 30.0–36.0)
MCV: 87.2 fL (ref 80.0–100.0)
Monocytes Absolute: 0.5 10*3/uL (ref 0.1–1.0)
Monocytes Relative: 8 %
Neutro Abs: 4.3 10*3/uL (ref 1.7–7.7)
Neutrophils Relative %: 66 %
Platelets: 288 10*3/uL (ref 150–400)
RBC: 4.83 MIL/uL (ref 4.22–5.81)
RDW: 13.4 % (ref 11.5–15.5)
WBC: 6.5 10*3/uL (ref 4.0–10.5)
nRBC: 0 % (ref 0.0–0.2)

## 2020-01-02 LAB — COMPREHENSIVE METABOLIC PANEL
ALT: 38 U/L (ref 0–44)
AST: 31 U/L (ref 15–41)
Albumin: 3.8 g/dL (ref 3.5–5.0)
Alkaline Phosphatase: 53 U/L (ref 38–126)
Anion gap: 8 (ref 5–15)
BUN: 12 mg/dL (ref 6–20)
CO2: 26 mmol/L (ref 22–32)
Calcium: 8.4 mg/dL — ABNORMAL LOW (ref 8.9–10.3)
Chloride: 103 mmol/L (ref 98–111)
Creatinine, Ser: 0.75 mg/dL (ref 0.61–1.24)
GFR calc non Af Amer: >60 mL/min (ref >60)
Glucose, Bld: 108 mg/dL — ABNORMAL HIGH (ref 70–99)
Potassium: 3.9 mmol/L (ref 3.5–5.1)
Sodium: 137 mmol/L (ref 135–145)
Total Bilirubin: 0.7 mg/dL (ref 0.3–1.2)
Total Protein: 7.4 g/dL (ref 6.5–8.1)

## 2020-01-02 MED ORDER — TRIAMTERENE 50 MG PO CAPS: 50.0000 mg | ORAL_CAPSULE | Freq: Every day | ORAL | 0 refills | Status: DC

## 2020-01-02 NOTE — ED Provider Notes (Signed)
[facility]    CSN: [id number] Arrival date & time: 01/02/20  1114      History   Chief Complaint Chief Complaint  Patient presents with  . Leg Swelling    HPI Jaggar Benko II is a 44 y.o. male who presents with increased bilateral lower leg swelling and tenderness on the R lower posterior leg( not calf). Has noticed some redness on his shin as well. He admit of having sleep apnea and not been using his C-pap this week and also been falling asleep on the couch.  Denies long flights or car rides. 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 Has not had labs with his PCP done yet since March this year.    Past Medical History:  Diagnosis Date  . Gall bladder inflammation   . Hypertension   . Obstructive sleep apnea     Patient Active Problem List   Diagnosis Date Noted  . Calculus of gallbladder without cholecystitis without obstruction 06/20/2019  . Atypical chest pain 05/28/2019  . Shortness of breath 05/28/2019  . Abnormal EKG 05/28/2019  . High triglycerides 05/23/2019  . History of cholelithiasis 05/23/2019  . Hypertension 05/23/2019  . Morbid obesity (HCC) 04/22/2019  . Use of cannabis oil 04/22/2019  . Fatigue 04/22/2019  . Encounter for annual physical exam 04/22/2019  . RUQ abdominal pain 04/22/2019  . Paresthesia of both hands 04/22/2019  . Bilateral wrist pain 04/22/2019  . Morbid obesity with BMI of 50.0-59.9, adult (HCC) 04/22/2019  . History of chest pain 04/22/2019  . Anxiety 04/22/2019  . Family history of coronary artery disease 04/22/2019    Past Surgical History:  Procedure Laterality Date  . ADENOIDECTOMY         Home Medications    Prior to Admission medications   Medication Sig Start Date End Date Taking? Authorizing Provider  losartan (COZAAR) 25 MG tablet Take 1 tablet (25 mg total) by mouth daily. 09/25/19   Flinchum, Eula Fried, FNP  UNABLE TO FIND CBD oil prn Patient not  taking: Reported on 09/25/2019    [provider]    Family History Family History  Problem Relation Age of Onset  . Diabetes Mother   . Diabetes Maternal Grandfather   . Heart attack Father 63    Social History Social History   Tobacco Use  . Smoking status: Former Smoker    Packs/day: 0.25    Years: 0.50    Pack years: 0.12    Types: Cigarettes    Quit date: 07/29/1999    Years since quitting: 20.4  . Smokeless tobacco: Former Neurosurgeon    Types: Snuff    Quit date: 08/2018  Vaping Use  . Vaping Use: Never used  Substance Use Topics  . Alcohol use: Yes    Alcohol/week: 2.0 - 3.0 standard drinks    Types: 2 - 3 Cans of beer per week  . Drug use: No     Allergies   Patient has no known allergies.   Review of Systems Review of Systems  Constitutional: Negative for fever.  Respiratory: Negative for chest tightness and shortness of breath.   Cardiovascular: Positive for leg swelling. Negative for chest pain.  Genitourinary: Negative for difficulty urinating.  Musculoskeletal: Negative for arthralgias, gait problem and joint swelling.  Skin: Positive for color change. Negative for rash.       + dry skin on lower shins and anterior ankles  Physical Exam Triage Vital Signs ED Triage Vitals  Enc Vitals Group     BP 01/02/20 1143 133/86     Pulse Rate 01/02/20 1143 71     Resp 01/02/20 1143 20     Temp 01/02/20 1143 99 F (37.2 C)     Temp src --      SpO2 01/02/20 1143 100 %     Weight 01/02/20 1146 (!) 360 lb (163.3 kg)     Height 01/02/20 1146 5\' 10"  (1.778 m)     Head Circumference --      Peak Flow --      Pain Score 01/02/20 1143 0     Pain Loc --      Pain Edu? --      Excl. in GC? --    No data found.  Updated Vital Signs BP 133/86   Pulse 71   Temp 99 F (37.2 C)   Resp 20   Ht 5\' 10"  (1.778 m)   Wt (!) 360 lb (163.3 kg)   SpO2 100%   BMI 51.65 kg/m   Visual Acuity Right Eye Distance:   Left Eye Distance:   Bilateral  Distance:    Right Eye Near:   Left Eye Near:    Bilateral Near:     Physical Exam Vitals and nursing note reviewed.  Constitutional:      General: He is not in acute distress.    Appearance: He is obese. He is not toxic-appearing.  HENT:     Head: Normocephalic.     Nose: Nose normal.  Eyes:     General: No scleral icterus.    Conjunctiva/sclera: Conjunctivae normal.  Cardiovascular:     Rate and Rhythm: Normal rate and regular rhythm.     Pulses: Normal pulses.     Heart sounds: No murmur heard.   Pulmonary:     Effort: Pulmonary effort is normal.     Breath sounds: Normal breath sounds.  Musculoskeletal:        General: Normal range of motion.     Cervical back: Neck supple.     Comments: Has +2/4 pitting edema of both ankles and lower legs to mid shin and calf. There are no calf cords or tenderness, but is a little tender on the posterior lower leg above the achillis. R leg circumference measurement is 57.5 cm, and L leg circumference is 57.5 cm Has negative Hoffman's sign.   Skin:    General: Skin is warm and dry.     Comments: R lower leg has mild anterior erythema and almost mild venostasis changes with dry skin on anterior lower shin.  L lower leg has the dry skin changes noted on the R lower leg, but no erythema.   Neurological:     Mental Status: He is alert and oriented to person, place, and time.     Gait: Gait normal.  Psychiatric:        Mood and Affect: Mood normal.        Behavior: Behavior normal.        Thought Content: Thought content normal.        Judgment: Judgment normal.    UC Treatments / Results  Labs (all labs ordered are listed, but only abnormal results are displayed) Labs Reviewed  COMPREHENSIVE METABOLIC PANEL - Abnormal; Notable for the following components:      Result Value   Glucose, Bld 108 (*)    Calcium 8.4 (*)  All other components within normal limits  CBC WITH DIFFERENTIAL/PLATELET    EKG   Radiology No results  found.  Procedures Procedures (including critical care time)  Medications Ordered in UC Medications - No data to display  Initial Impression / Assessment and Plan / UC Course  I have reviewed the triage vital signs and the nursing notes. Has dependent edema more likely from not using his C-pap and falling asleep with his legs on the floor sitting up. His renal function is normal, so I will have him try Maxide 50 mg qd x 7 days for edema. His CBC is normal, so the erythema on his R lower leg may be just inflammation from mild venostasis.  Encouraged to get support socks and wear them all the time, specially at work when sitting for so long  And work on wt loss. See instructions.  Pertinent labs & imaging results that were available during my care of the patient were reviewed by me and considered in my medical decision making (see chart for details).   Final Clinical Impressions(s) / UC Diagnoses   Final diagnoses:  None   Discharge Instructions   None    ED Prescriptions    None     PDMP not reviewed this encounter.   Garey Ham, PA-C 01/02/20 1400

## 2020-01-02 NOTE — ED Triage Notes (Signed)
Pt reports having bila leg swelling x2 days. Pt sts he sits most of the time at work, swells more when sitting.  No other symptoms or concerns at this time.

## 2020-01-02 NOTE — Discharge Instructions (Addendum)
Your lab work shows normal white cell count which means no signs of infection and your kidney function is normal, so I can have you try a diuretic.Your calcium is a little low, so after done with the diuretic, increase your calcium intake.  I advised you to use your C-pap every night and avoid going to sleep with your legs down. Try to elevate them above your heart 2-3 times a day for at least 1 hours.  If you right lower leg pain gets worse,  or this legs gets more swollen then the L, go to ER so they can do an ultrasound( we do not have ultrasound today)

## 2020-03-13 ENCOUNTER — Ambulatory Visit: Payer: Self-pay | Admitting: Adult Health

## 2020-03-13 ENCOUNTER — Ambulatory Visit: Payer: BC Managed Care – PPO | Admitting: Adult Health

## 2020-03-16 ENCOUNTER — Other Ambulatory Visit: Payer: Self-pay

## 2020-03-16 ENCOUNTER — Ambulatory Visit (INDEPENDENT_AMBULATORY_CARE_PROVIDER_SITE_OTHER): Payer: BC Managed Care – PPO | Admitting: Adult Health

## 2020-03-16 VITALS — BP 142/77 | HR 78 | Resp 20 | Ht 70.0 in | Wt 385.4 lb

## 2020-03-16 DIAGNOSIS — E559 Vitamin D deficiency, unspecified: Secondary | ICD-10-CM

## 2020-03-16 DIAGNOSIS — I10 Essential (primary) hypertension: Secondary | ICD-10-CM | POA: Diagnosis not present

## 2020-03-16 MED ORDER — LOSARTAN POTASSIUM 25 MG PO TABS: 25.0000 mg | ORAL_TABLET | Freq: Every day | ORAL | 1 refills | Status: DC

## 2020-03-16 MED ORDER — HYDROCHLOROTHIAZIDE 25 MG PO TABS
12.5000 mg | ORAL_TABLET | Freq: Every day | ORAL | 3 refills | Status: DC
Start: 2020-03-16 — End: 2020-07-06

## 2020-03-16 NOTE — Progress Notes (Signed)
Established patient visit   Patient: Steve [name]   DOB: Jun 02, 1975   44 y.o. Male  MRN: [id number] Visit Date: 03/16/2020  Today's healthcare provider: Jairo Ben, FNP   Chief Complaint  Patient presents with   Follow-up    HTN follow-up   Subjective    HPI HPI    Follow-up     Additional comments: HTN follow-up       Last edited by Paticia Stack, CMA on 03/16/2020 11:24 AM. (History)      Hypertension, follow-up  BP Readings from Last 3 Encounters:  03/16/20 (!) 142/77  01/02/20 133/86  09/25/19 118/68   Wt Readings from Last 3 Encounters:  03/16/20 (!) 385 lb 6.4 oz (174.8 kg)  01/02/20 (!) 360 lb (163.3 kg)  09/25/19 (!) 370 lb 12.8 oz (168.2 kg)     He was last seen for hypertension 6 months ago.  BP at that visit was 118/68. Management since that visit includes none, patient to continue Losartan 25mg  qd.  Starting a c cleanse and diet at the beginning of the year.   At home he has been 115-125 / high 70's and low 80's. He reports good compliance with treatment. He is not having side effects.  He is following a Regular diet. He is exercising. Walking everyday. He does not smoke.   Gallbladder has been stable with diet controlled, does not want surgical refferal.   Use of agents associated with hypertension: none.   Outside blood pressures are 120/70, sometimes 130/80, and never goes above 149/91.  Patient  denies any fever, body aches,chills, rash, chest pain, shortness of breath, nausea, vomiting, or diarrhea.   Symptoms: No chest pain No chest pressure  No palpitations No syncope  No dyspnea No orthopnea  No paroxysmal nocturnal dyspnea No lower extremity edema   Pertinent labs: Lab Results  Component Value Date   CHOL 138 04/22/2019   HDL 30 (L) 04/22/2019   LDLCALC 73 04/22/2019   TRIG 211 (H) 04/22/2019   CHOLHDL 4.6 04/22/2019   Lab Results  Component Value Date   NA 137 01/02/2020   K 3.9 01/02/2020    CREATININE 0.75 01/02/2020   GFRNONAA >60 01/02/2020   GFRAA 123 04/22/2019   GLUCOSE 108 (H) 01/02/2020     The 10-year ASCVD risk score 03/03/2020 DC Jr., et al., 2013) is: 2.3%   --------------------------------------------------------------------------------------------------- Patient  denies any fever, body aches,chills, rash, chest pain, shortness of breath, nausea, vomiting, or diarrhea.  Denies dizziness, lightheadedness, pre syncopal or syncopal episodes.    Patient Active Problem List   Diagnosis Date Noted   Vitamin D insufficiency 03/18/2020   Calculus of gallbladder without cholecystitis without obstruction 06/20/2019   Atypical chest pain 05/28/2019   Shortness of breath 05/28/2019   Abnormal EKG 05/28/2019   High triglycerides 05/23/2019   History of cholelithiasis 05/23/2019   Hypertension 05/23/2019   Morbid obesity (HCC) 04/22/2019   Use of cannabis oil 04/22/2019   Fatigue 04/22/2019   Encounter for annual physical exam 04/22/2019   RUQ abdominal pain 04/22/2019   Paresthesia of both hands 04/22/2019   Bilateral wrist pain 04/22/2019   Morbid obesity with BMI of 50.0-59.9, adult (HCC) 04/22/2019   History of chest pain 04/22/2019   Anxiety 04/22/2019   Family history of coronary artery disease 04/22/2019   Past Medical History:  Diagnosis Date   Gall bladder inflammation    Hypertension    Obstructive sleep apnea  Family History  Problem Relation Age of Onset   Diabetes Mother    Diabetes Maternal Grandfather    Heart attack Father 20   No Known Allergies     Medications: Outpatient Medications Prior to Visit  Medication Sig   UNABLE TO FIND CBD oil prn   [DISCONTINUED] losartan (COZAAR) 25 MG tablet Take 1 tablet (25 mg total) by mouth daily.   [DISCONTINUED] triamterene (DYRENIUM) 50 MG capsule Take 1 capsule (50 mg total) by mouth daily for 7 days.   No facility-administered medications prior to visit.     Review of Systems  Constitutional: Negative.   HENT: Negative.   Eyes: Negative.   Respiratory: Negative.   Cardiovascular: Negative.   Gastrointestinal: Negative.   Genitourinary: Negative.   Musculoskeletal: Negative.   Skin: Negative.       Objective    BP (!) 142/77 (BP Location: Right Arm, Patient Position: Sitting, Cuff Size: Large)    Pulse 78    Resp 20    Ht 5\' 10"  (1.778 m)    Wt (!) 385 lb 6.4 oz (174.8 kg)    SpO2 99%    BMI 55.30 kg/m  BP Readings from Last 3 Encounters:  03/16/20 (!) 142/77  01/02/20 133/86  09/25/19 118/68   Wt Readings from Last 3 Encounters:  03/16/20 (!) 385 lb 6.4 oz (174.8 kg)  01/02/20 (!) 360 lb (163.3 kg)  09/25/19 (!) 370 lb 12.8 oz (168.2 kg)      Physical Exam Vitals and nursing note reviewed.  Constitutional:      General: He is active. He is not in acute distress.    Appearance: Normal appearance. He is well-developed and well-nourished. He is obese. He is not ill-appearing, toxic-appearing, sickly-appearing or diaphoretic.     Comments: Patient is alert and oriented and responsive to questions Engages in eye contact with provider. Speaks in full sentences without any pauses without any shortness of breath or distress.    HENT:     Head: Normocephalic and atraumatic.     Right Ear: Hearing, tympanic membrane, ear canal and external ear normal.     Left Ear: Hearing, tympanic membrane, ear canal and external ear normal.     Nose: Nose normal.     Mouth/Throat:     Mouth: Oropharynx is clear and moist and mucous membranes are normal.     Pharynx: Uvula midline. No oropharyngeal exudate.  Eyes:     General: Lids are normal. No scleral icterus.       Right eye: No discharge.        Left eye: No discharge.     Extraocular Movements: EOM normal.     Conjunctiva/sclera: Conjunctivae normal.     Pupils: Pupils are equal, round, and reactive to light.  Neck:     Thyroid: No thyromegaly.     Vascular: Normal carotid pulses.  No carotid bruit, hepatojugular reflux or JVD.     Trachea: Trachea and phonation normal. No tracheal tenderness or tracheal deviation.     Meningeal: Brudzinski's sign absent.  Cardiovascular:     Rate and Rhythm: Normal rate and regular rhythm.     Pulses: Normal pulses and intact distal pulses.     Heart sounds: Normal heart sounds, S1 normal and S2 normal. Heart sounds not distant. No murmur heard. No friction rub. No gallop.   Pulmonary:     Effort: Pulmonary effort is normal. No accessory muscle usage or respiratory distress.     Breath  sounds: Normal breath sounds. No stridor. No wheezing or rales.  Chest:     Chest wall: No tenderness.  Abdominal:     General: Bowel sounds are normal. There is no distension. Aorta is normal.     Palpations: Abdomen is soft. There is no mass.     Tenderness: There is no abdominal tenderness. There is no guarding or rebound.  Genitourinary:    Comments: No gynecology exams done in this office currently and no equipment available. Patient is aware she will have to see gynecology if needed for any pelvic/vaginal exam and will follow up with gynecology/obgyn as needed. Yearly gynecology pelvic exam recommended. Patient verbalized understanding of instructions and denies any further questions at this time.   Musculoskeletal:        General: No tenderness, deformity or edema. Normal range of motion.     Cervical back: Full passive range of motion without pain, normal range of motion and neck supple.     Comments: Patient moves on and off of exam table and in room without difficulty. Gait is normal in hall and in room. Patient is oriented to person place time and situation. Patient answers questions appropriately and engages in conversation.   Lymphadenopathy:     Head:     Right side of head: No submental, submandibular, tonsillar, preauricular, posterior auricular or occipital adenopathy.     Left side of head: No submental, submandibular, tonsillar,  preauricular, posterior auricular or occipital adenopathy.     Cervical: No cervical adenopathy.     Upper Body:  No axillary adenopathy present. Skin:    General: Skin is warm, dry and intact.     Capillary Refill: Capillary refill takes less than 2 seconds.     Coloration: Skin is not pale.     Findings: No erythema or rash.     Nails: There is no clubbing or cyanosis.  Neurological:     Mental Status: He is alert and oriented to person, place, and time.     GCS: GCS eye subscore is 4. GCS verbal subscore is 5. GCS motor subscore is 6.     Cranial Nerves: No cranial nerve deficit.     Sensory: No sensory deficit.     Motor: No abnormal muscle tone.     Coordination: He displays a negative Romberg sign. Coordination normal.     Deep Tendon Reflexes: Strength normal and reflexes are normal and symmetric. Reflexes normal.  Psychiatric:        Mood and Affect: Mood and affect normal.        Speech: Speech normal.        Behavior: Behavior normal.        Thought Content: Thought content normal.        Cognition and Memory: Cognition and memory normal.        Judgment: Judgment normal.     Patient appers well, not sickly. Speaking in complete sentences. Patient moves on and off of exam table and in room without difficulty. Gait is normal in hall and in room. Patient is oriented to person place time and situation. Patient answers questions appropriately and engages eye contact and verbal dialect with provider.    No results found for any visits on 03/16/20.  Assessment & Plan    Vitamin D insufficiency - Plan: VITAMIN D 25 Hydroxy (Vit-D Deficiency, Fractures)  Hypertension, unspecified type - Plan: losartan (COZAAR) 25 MG tablet, CBC with Differential/Platelet, Comprehensive Metabolic Panel (CMET), TSH  Meds ordered  this encounter  Medications   losartan (COZAAR) 25 MG tablet    Sig: Take 1 tablet (25 mg total) by mouth daily.    Dispense:  90 tablet    Refill:  1    hydrochlorothiazide (HYDRODIURIL) 25 MG tablet    Sig: Take 0.5 tablets (12.5 mg total) by mouth daily.    Dispense:  90 tablet    Refill:  3  refill given , labs in 3 months.    Return if symptoms worsen or fail to improve, for at any time for any worsening symptoms, Go to Emergency room/ urgent care if worse.      Red Flags discussed. The patient was given clear instructions to go to ER or return to medical center if any red flags develop, symptoms do not improve, worsen or new problems develop. They verbalized understanding.   The entirety of the information documented in the History of Present Illness, Review of Systems and Physical Exam were personally obtained by me. Portions of this information were initially documented by the CMA and reviewed by me for thoroughness and accuracy.      Jairo Ben, FNP  [facility] [phone] (phone) [phone] (fax)  [facility]

## 2020-03-16 NOTE — Patient Instructions (Signed)
Losartan Tablets What is this medicine? LOSARTAN (loe SAR tan) is an angiotensin II receptor blocker, also known as an ARB. It treats high blood pressure. It can slow kidney damage in some patients. It may also be used to lower the risk of stroke. This medicine may be used for other purposes; ask your health care provider or pharmacist if you have questions. COMMON BRAND NAME(S): Cozaar What should I tell my health care provider before I take this medicine? They need to know if you have any of these conditions:  heart failure  kidney or liver disease  an unusual or allergic reaction to losartan, other medicines, foods, dyes, or preservatives  pregnant or trying to get pregnant  breast-feeding How should I use this medicine? Take this drug by mouth. Take it as directed on the prescription label at the same time every day. You can take it with or without food. If it upsets your stomach, take it with food. Keep taking it unless your health care provider tells you to stop. Talk to your health care provider about the use of this drug in children. While it may be prescribed for children as young as 6 for selected conditions, precautions do apply. Overdosage: If you think you have taken too much of this medicine contact a poison control center or emergency room at once. NOTE: This medicine is only for you. Do not share this medicine with others. What if I miss a dose? If you miss a dose, take it as soon as you can. If it is almost time for your next dose, take only that dose. Do not take double or extra doses. What may interact with this medicine?  blood pressure medicines  diuretics, especially triamterene, spironolactone, or amiloride  fluconazole  NSAIDs, medicines for pain and inflammation, like ibuprofen or naproxen  potassium salts or potassium supplements  rifampin This list may not describe all possible interactions. Give your health care provider a list of all the medicines,  herbs, non-prescription drugs, or dietary supplements you use. Also tell them if you smoke, drink alcohol, or use illegal drugs. Some items may interact with your medicine. What should I watch for while using this medicine? Visit your doctor or health care professional for regular checks on your progress. Check your blood pressure as directed. Ask your doctor or health care professional what your blood pressure should be and when you should contact him or her. Call your doctor or health care professional if you notice an irregular or fast heart beat. Women should inform their doctor if they wish to become pregnant or think they might be pregnant. There is a potential for serious side effects to an unborn child, particularly in the second or third trimester. Talk to your health care professional or pharmacist for more information. You may get drowsy or dizzy. Do not drive, use machinery, or do anything that needs mental alertness until you know how this drug affects you. Do not stand or sit up quickly, especially if you are an older patient. This reduces the risk of dizzy or fainting spells. Alcohol can make you more drowsy and dizzy. Avoid alcoholic drinks. Avoid salt substitutes unless you are told otherwise by your doctor or health care professional. Do not treat yourself for coughs, colds, or pain while you are taking this medicine without asking your doctor or health care professional for advice. Some ingredients may increase your blood pressure. What side effects may I notice from receiving this medicine? Side effects that you  should report to your doctor or health care professional as soon as possible:  confusion, dizziness, light headedness or fainting spells  decreased amount of urine passed  difficulty breathing or swallowing, hoarseness, or tightening of the throat  fast or irregular heart beat, palpitations, or chest pain  skin rash, itching  swelling of your face, lips, tongue, hands,  or feet Side effects that usually do not require medical attention (report to your doctor or health care professional if they continue or are bothersome):  cough  decreased sexual function or desire  headache  nasal congestion or stuffiness  nausea or stomach pain  sore or cramping muscles This list may not describe all possible side effects. Call your doctor for medical advice about side effects. You may report side effects to FDA at 1-800-FDA-1088. Where should I keep my medicine? Keep out of the reach of children and pets. Store at room temperature between 15 and 30 degrees C (59 and 86 degrees F). Protect from light. Keep the container tightly closed. Throw away any unused drug after the expiration date. NOTE: This sheet is a summary. It may not cover all possible information. If you have questions about this medicine, talk to your doctor, pharmacist, or health care provider.  2020 Elsevier/Gold Standard (2018-10-17 12:12:28) Hydrochlorothiazide, HCTZ Oral Capsules or Tablets What is this medicine? HYDROCHLOROTHIAZIDE (hye droe klor oh THYE a zide) is a diuretic. It helps you make more urine and to lose salt and excess water from your body. It treats swelling from heart, kidney, or liver disease. It also treats high blood pressure. This medicine may be used for other purposes; ask your health care provider or pharmacist if you have questions. COMMON BRAND NAME(S): Esidrix, Ezide, HydroDIURIL, Microzide, Oretic, Zide What should I tell my health care provider before I take this medicine? They need to know if you have any of these conditions:  diabetes  gout  immune system problems, like lupus  kidney disease or kidney stones  liver disease  pancreatitis  small amount of urine or difficulty passing urine  an unusual or allergic reaction to hydrochlorothiazide, sulfa drugs, other medicines, foods, dyes, or preservatives  pregnant or trying to get  pregnant  breast-feeding How should I use this medicine? Take this drug by mouth. Take it as directed on the prescription label at the same time every day. You can take it with or without food. If it upsets your stomach, take it with food. Keep taking it unless your health care provider tells you to stop. Talk to your health care provider about the use of this drug in children. While it may be prescribed for children as young as newborns for selected conditions, precautions do apply. Overdosage: If you think you have taken too much of this medicine contact a poison control center or emergency room at once. NOTE: This medicine is only for you. Do not share this medicine with others. What if I miss a dose? If you miss a dose, take it as soon as you can. If it is almost time for your next dose, take only that dose. Do not take double or extra doses. What may interact with this medicine?  cholestyramine  colestipol  digoxin  dofetilide  lithium  medicines for blood pressure  medicines for diabetes  medicines that relax muscles for surgery  other diuretics  steroid medicines like prednisone or cortisone This list may not describe all possible interactions. Give your health care provider a list of all the medicines,  herbs, non-prescription drugs, or dietary supplements you use. Also tell them if you smoke, drink alcohol, or use illegal drugs. Some items may interact with your medicine. What should I watch for while using this medicine? Visit your doctor or health care professional for regular checks on your progress. Check your blood pressure as directed. Ask your doctor or health care professional what your blood pressure should be and when you should contact him or her. Talk to your health care professional about your risk of skin cancer. You may be more at risk for skin cancer if you take this medicine. This medicine can make you more sensitive to the sun. Keep out of the sun. If you  cannot avoid being in the sun, wear protective clothing and use sunscreen. Do not use sun lamps or tanning beds/booths. You may need to be on a special diet while taking this medicine. Ask your doctor. Check with your doctor or health care professional if you get an attack of severe diarrhea, nausea and vomiting, or if you sweat a lot. The loss of too much body fluid can make it dangerous for you to take this medicine. You may get drowsy or dizzy. Do not drive, use machinery, or do anything that needs mental alertness until you know how this medicine affects you. Do not stand or sit up quickly, especially if you are an older patient. This reduces the risk of dizzy or fainting spells. Alcohol may interfere with the effect of this medicine. Avoid alcoholic drinks. This medicine may increase blood sugar. Ask your healthcare provider if changes in diet or medicines are needed if you have diabetes. What side effects may I notice from receiving this medicine? Side effects that you should report to your doctor or health care professional as soon as possible:  allergic reactions such as skin rash or itching, hives, swelling of the lips, mouth, tongue, or throat  changes in vision  chest pain  eye pain  fast or irregular heartbeat  feeling faint or lightheaded, falls  gout attack  muscle pain or cramps  pain or difficulty when passing urine  pain, tingling, numbness in the hands or feet  redness, blistering, peeling or loosening of the skin, including inside the mouth   signs and symptoms of high blood sugar such as being more thirsty or hungry or having to urinate more than normal. You may also feel very tired or have blurry vision.  unusually weak Side effects that usually do not require medical attention (report to your doctor or health care professional if they continue or are bothersome):  change in sex drive or performance  dry mouth  headache  stomach upset This list may not  describe all possible side effects. Call your doctor for medical advice about side effects. You may report side effects to FDA at 1-800-FDA-1088. Where should I keep my medicine? Keep out of the reach of children and pets. Store at room temperature between 20 and 25 degrees C (68 and 77 degrees F). Protect from light and moisture. Keep the container tightly closed. Do not freeze. Throw away any unused drug after the expiration date. NOTE: This sheet is a summary. It may not cover all possible information. If you have questions about this medicine, talk to your doctor, pharmacist, or health care provider.  2020 Elsevier/Gold Standard (2018-11-15 16:52:59) Mediterranean Diet A Mediterranean diet refers to food and lifestyle choices that are based on the traditions of countries located on the [place]. This way of  eating has been shown to help prevent certain conditions and improve outcomes for people who have chronic diseases, like kidney disease and heart disease. What are tips for following this plan? Lifestyle  Cook and eat meals together with your family, when possible.  Drink enough fluid to keep your urine clear or pale yellow.  Be physically active every day. This includes: ? Aerobic exercise like running or swimming. ? Leisure activities like gardening, walking, or housework.  Get 7-8 hours of sleep each night.  If recommended by your health care provider, drink red wine in moderation. This means 1 glass a day for nonpregnant women and 2 glasses a day for men. A glass of wine equals 5 oz (150 mL). Reading food labels   Check the serving size of packaged foods. For foods such as rice and pasta, the serving size refers to the amount of cooked product, not dry.  Check the total fat in packaged foods. Avoid foods that have saturated fat or trans fats.  Check the ingredients list for added sugars, such as corn syrup. Shopping  At the grocery store, buy most of your food  from the areas near the walls of the store. This includes: ? Fresh fruits and vegetables (produce). ? Grains, beans, nuts, and seeds. Some of these may be available in unpackaged forms or large amounts (in bulk). ? Fresh seafood. ? Poultry and eggs. ? Low-fat dairy products.  Buy whole ingredients instead of prepackaged foods.  Buy fresh fruits and vegetables in-season from local farmers markets.  Buy frozen fruits and vegetables in resealable bags.  If you do not have access to quality fresh seafood, buy precooked frozen shrimp or canned fish, such as tuna, salmon, or sardines.  Buy small amounts of raw or cooked vegetables, salads, or olives from the deli or salad bar at your store.  Stock your pantry so you always have certain foods on hand, such as olive oil, canned tuna, canned tomatoes, rice, pasta, and beans. Cooking  Cook foods with extra-virgin olive oil instead of using butter or other vegetable oils.  Have meat as a side dish, and have vegetables or grains as your main dish. This means having meat in small portions or adding small amounts of meat to foods like pasta or stew.  Use beans or vegetables instead of meat in common dishes like chili or lasagna.  Experiment with different cooking methods. Try roasting or broiling vegetables instead of steaming or sauteing them.  Add frozen vegetables to soups, stews, pasta, or rice.  Add nuts or seeds for added healthy fat at each meal. You can add these to yogurt, salads, or vegetable dishes.  Marinate fish or vegetables using olive oil, lemon juice, garlic, and fresh herbs. Meal planning   Plan to eat 1 vegetarian meal one day each week. Try to work up to 2 vegetarian meals, if possible.  Eat seafood 2 or more times a week.  Have healthy snacks readily available, such as: ? Vegetable sticks with hummus. ? [place] yogurt. ? Fruit and nut trail mix.  Eat balanced meals throughout the week. This includes: ? Fruit: 2-3  servings a day ? Vegetables: 4-5 servings a day ? Low-fat dairy: 2 servings a day ? Fish, poultry, or lean meat: 1 serving a day ? Beans and legumes: 2 or more servings a week ? Nuts and seeds: 1-2 servings a day ? Whole grains: 6-8 servings a day ? Extra-virgin olive oil: 3-4 servings a day  Limit red  meat and sweets to only a few servings a month What are my food choices?  Mediterranean diet ? Recommended  Grains: Whole-grain pasta. Brown rice. Bulgar wheat. Polenta. Couscous. Whole-wheat bread. Orpah Cobb.  Vegetables: Artichokes. Beets. Broccoli. Cabbage. Carrots. Eggplant. Green beans. Chard. Kale. Spinach. Onions. Leeks. Peas. Squash. Tomatoes. Peppers. Radishes.  Fruits: Apples. Apricots. Avocado. Berries. Bananas. Cherries. Dates. Figs. Grapes. Lemons. Melon. Oranges. Peaches. Plums. Pomegranate.  Meats and other protein foods: Beans. Almonds. Sunflower seeds. Pine nuts. Peanuts. Cod. Salmon. Scallops. Shrimp. Tuna. Tilapia. Clams. Oysters. Eggs.  Dairy: Low-fat milk. Cheese. Greek yogurt.  Beverages: Water. Red wine. Herbal tea.  Fats and oils: Extra virgin olive oil. Avocado oil. Grape seed oil.  Sweets and desserts: [place] yogurt with honey. Baked apples. Poached pears. Trail mix.  Seasoning and other foods: Basil. Cilantro. Coriander. Cumin. Mint. Parsley. Sage. Rosemary. Tarragon. Garlic. Oregano. Thyme. Pepper. Balsalmic vinegar. Tahini. Hummus. Tomato sauce. Olives. Mushrooms. ? Limit these  Grains: Prepackaged pasta or rice dishes. Prepackaged cereal with added sugar.  Vegetables: Deep fried potatoes (french fries).  Fruits: Fruit canned in syrup.  Meats and other protein foods: Beef. Pork. Lamb. Poultry with skin. Hot dogs. Tomasa Blase.  Dairy: Ice cream. Sour cream. Whole milk.  Beverages: Juice. Sugar-sweetened soft drinks. Beer. Liquor and spirits.  Fats and oils: Butter. Canola oil. Vegetable oil. Beef fat (tallow). Lard.  Sweets and desserts:  Cookies. Cakes. Pies. Candy.  Seasoning and other foods: Mayonnaise. Premade sauces and marinades. The items listed may not be a complete list. Talk with your dietitian about what dietary choices are right for you. Summary  The Mediterranean diet includes both food and lifestyle choices.  Eat a variety of fresh fruits and vegetables, beans, nuts, seeds, and whole grains.  Limit the amount of red meat and sweets that you eat.  Talk with your health care provider about whether it is safe for you to drink red wine in moderation. This means 1 glass a day for nonpregnant women and 2 glasses a day for men. A glass of wine equals 5 oz (150 mL). This information is not intended to replace advice given to you by your health care provider. Make sure you discuss any questions you have with your health care provider. Document Revised: 11/12/2015 Document Reviewed: 11/05/2015 Elsevier Patient Education  2020 Elsevier Inc. DASH Eating Plan DASH stands for "Dietary Approaches to Stop Hypertension." The DASH eating plan is a healthy eating plan that has been shown to reduce high blood pressure (hypertension). It may also reduce your risk for type 2 diabetes, heart disease, and stroke. The DASH eating plan may also help with weight loss. What are tips for following this plan?  General guidelines  Avoid eating more than 2,300 mg (milligrams) of salt (sodium) a day. If you have hypertension, you may need to reduce your sodium intake to 1,500 mg a day.  Limit alcohol intake to no more than 1 drink a day for nonpregnant women and 2 drinks a day for men. One drink equals 12 oz of beer, 5 oz of wine, or 1 oz of hard liquor.  Work with your health care provider to maintain a healthy body weight or to lose weight. Ask what an ideal weight is for you.  Get at least 30 minutes of exercise that causes your heart to beat faster (aerobic exercise) most days of the week. Activities may include walking, swimming, or  biking.  Work with your health care provider or diet and nutrition specialist (dietitian)  to adjust your eating plan to your individual calorie needs. Reading food labels   Check food labels for the amount of sodium per serving. Choose foods with less than 5 percent of the Daily Value of sodium. Generally, foods with less than 300 mg of sodium per serving fit into this eating plan.  To find whole grains, look for the word "whole" as the first word in the ingredient list. Shopping  Buy products labeled as "low-sodium" or "no salt added."  Buy fresh foods. Avoid canned foods and premade or frozen meals. Cooking  Avoid adding salt when cooking. Use salt-free seasonings or herbs instead of table salt or sea salt. Check with your health care provider or pharmacist before using salt substitutes.  Do not fry foods. Cook foods using healthy methods such as baking, boiling, grilling, and broiling instead.  Cook with heart-healthy oils, such as olive, canola, soybean, or sunflower oil. Meal planning  Eat a balanced diet that includes: ? 5 or more servings of fruits and vegetables each day. At each meal, try to fill half of your plate with fruits and vegetables. ? Up to 6-8 servings of whole grains each day. ? Less than 6 oz of lean meat, poultry, or fish each day. A 3-oz serving of meat is about the same size as a deck of cards. One egg equals 1 oz. ? 2 servings of low-fat dairy each day. ? A serving of nuts, seeds, or beans 5 times each week. ? Heart-healthy fats. Healthy fats called Omega-3 fatty acids are found in foods such as flaxseeds and coldwater fish, like sardines, salmon, and mackerel.  Limit how much you eat of the following: ? Canned or prepackaged foods. ? Food that is high in trans fat, such as fried foods. ? Food that is high in saturated fat, such as fatty meat. ? Sweets, desserts, sugary drinks, and other foods with added sugar. ? Full-fat dairy products.  Do not salt  foods before eating.  Try to eat at least 2 vegetarian meals each week.  Eat more home-cooked food and less restaurant, buffet, and fast food.  When eating at a restaurant, ask that your food be prepared with less salt or no salt, if possible. What foods are recommended? The items listed may not be a complete list. Talk with your dietitian about what dietary choices are best for you. Grains Whole-grain or whole-wheat bread. Whole-grain or whole-wheat pasta. Brown rice. Orpah Cobb. Bulgur. Whole-grain and low-sodium cereals. Pita bread. Low-fat, low-sodium crackers. Whole-wheat flour tortillas. Vegetables Fresh or frozen vegetables (raw, steamed, roasted, or grilled). Low-sodium or reduced-sodium tomato and vegetable juice. Low-sodium or reduced-sodium tomato sauce and tomato paste. Low-sodium or reduced-sodium canned vegetables. Fruits All fresh, dried, or frozen fruit. Canned fruit in natural juice (without added sugar). Meat and other protein foods Skinless chicken or [place]. Ground chicken or [place]. Pork with fat trimmed off. Fish and seafood. Egg whites. Dried beans, peas, or lentils. Unsalted nuts, nut butters, and seeds. Unsalted canned beans. Lean cuts of beef with fat trimmed off. Low-sodium, lean deli meat. Dairy Low-fat (1%) or fat-free (skim) milk. Fat-free, low-fat, or reduced-fat cheeses. Nonfat, low-sodium ricotta or cottage cheese. Low-fat or nonfat yogurt. Low-fat, low-sodium cheese. Fats and oils Soft margarine without trans fats. Vegetable oil. Low-fat, reduced-fat, or light mayonnaise and salad dressings (reduced-sodium). Canola, safflower, olive, soybean, and sunflower oils. Avocado. Seasoning and other foods Herbs. Spices. Seasoning mixes without salt. Unsalted popcorn and pretzels. Fat-free sweets. What foods are not recommended?  The items listed may not be a complete list. Talk with your dietitian about what dietary choices are best for you. Grains Baked goods  made with fat, such as croissants, muffins, or some breads. Dry pasta or rice meal packs. Vegetables Creamed or fried vegetables. Vegetables in a cheese sauce. Regular canned vegetables (not low-sodium or reduced-sodium). Regular canned tomato sauce and paste (not low-sodium or reduced-sodium). Regular tomato and vegetable juice (not low-sodium or reduced-sodium). Rosita Fire. Olives. Fruits Canned fruit in a light or heavy syrup. Fried fruit. Fruit in cream or butter sauce. Meat and other protein foods Fatty cuts of meat. Ribs. Fried meat. Tomasa Blase. Sausage. Bologna and other processed lunch meats. Salami. Fatback. Hotdogs. Bratwurst. Salted nuts and seeds. Canned beans with added salt. Canned or smoked fish. Whole eggs or egg yolks. Chicken or [place] with skin. Dairy Whole or 2% milk, cream, and half-and-half. Whole or full-fat cream cheese. Whole-fat or sweetened yogurt. Full-fat cheese. Nondairy creamers. Whipped toppings. Processed cheese and cheese spreads. Fats and oils Butter. Stick margarine. Lard. Shortening. Ghee. Bacon fat. Tropical oils, such as coconut, palm kernel, or palm oil. Seasoning and other foods Salted popcorn and pretzels. Onion salt, garlic salt, seasoned salt, table salt, and sea salt. Worcestershire sauce. Tartar sauce. Barbecue sauce. Teriyaki sauce. Soy sauce, including reduced-sodium. Steak sauce. Canned and packaged gravies. Fish sauce. Oyster sauce. Cocktail sauce. Horseradish that you find on the shelf. Ketchup. Mustard. Meat flavorings and tenderizers. Bouillon cubes. Hot sauce and Tabasco sauce. Premade or packaged marinades. Premade or packaged taco seasonings. Relishes. Regular salad dressings. Where to find more information:  National Heart, Lung, and Blood Institute: PopSteam.is  American Heart Association: www.heart.org Summary  The DASH eating plan is a healthy eating plan that has been shown to reduce high blood pressure (hypertension). It may also reduce  your risk for type 2 diabetes, heart disease, and stroke.  With the DASH eating plan, you should limit salt (sodium) intake to 2,300 mg a day. If you have hypertension, you may need to reduce your sodium intake to 1,500 mg a day.  When on the DASH eating plan, aim to eat more fresh fruits and vegetables, whole grains, lean proteins, low-fat dairy, and heart-healthy fats.  Work with your health care provider or diet and nutrition specialist (dietitian) to adjust your eating plan to your individual calorie needs. This information is not intended to replace advice given to you by your health care provider. Make sure you discuss any questions you have with your health care provider. Document Revised: 02/24/2017 Document Reviewed: 03/07/2016 Elsevier Patient Education  2020 [employer]. Hypertension, Adult Hypertension is another name for high blood pressure. High blood pressure forces your heart to work harder to pump blood. This can cause problems over time. There are two numbers in a blood pressure reading. There is a top number (systolic) over a bottom number (diastolic). It is best to have a blood pressure that is below 120/80. Healthy choices can help lower your blood pressure, or you may need medicine to help lower it. What are the causes? The cause of this condition is not known. Some conditions may be related to high blood pressure. What increases the risk?  Smoking.  Having type 2 diabetes mellitus, high cholesterol, or both.  Not getting enough exercise or physical activity.  Being overweight.  Having too much fat, sugar, calories, or salt (sodium) in your diet.  Drinking too much alcohol.  Having long-term (chronic) kidney disease.  Having a family history of  high blood pressure.  Age. Risk increases with age.  Race. You may be at higher risk if you are African American.  Gender. Men are at higher risk than women before age 29. After age 45, women are at higher risk than  men.  Having obstructive sleep apnea.  Stress. What are the signs or symptoms?  High blood pressure may not cause symptoms. Very high blood pressure (hypertensive crisis) may cause: ? Headache. ? Feelings of worry or nervousness (anxiety). ? Shortness of breath. ? Nosebleed. ? A feeling of being sick to your stomach (nausea). ? Throwing up (vomiting). ? Changes in how you see. ? Very bad chest pain. ? Seizures. How is this treated?  This condition is treated by making healthy lifestyle changes, such as: ? Eating healthy foods. ? Exercising more. ? Drinking less alcohol.  Your health care provider may prescribe medicine if lifestyle changes are not enough to get your blood pressure under control, and if: ? Your top number is above 130. ? Your bottom number is above 80.  Your personal target blood pressure may vary. Follow these instructions at home: Eating and drinking   If told, follow the DASH eating plan. To follow this plan: ? Fill one half of your plate at each meal with fruits and vegetables. ? Fill one fourth of your plate at each meal with whole grains. Whole grains include whole-wheat pasta, brown rice, and whole-grain bread. ? Eat or drink low-fat dairy products, such as skim milk or low-fat yogurt. ? Fill one fourth of your plate at each meal with low-fat (lean) proteins. Low-fat proteins include fish, chicken without skin, eggs, beans, and tofu. ? Avoid fatty meat, cured and processed meat, or chicken with skin. ? Avoid pre-made or processed food.  Eat less than 1,500 mg of salt each day.  Do not drink alcohol if: ? Your doctor tells you not to drink. ? You are pregnant, may be pregnant, or are planning to become pregnant.  If you drink alcohol: ? Limit how much you use to:  0-1 drink a day for women.  0-2 drinks a day for men. ? Be aware of how much alcohol is in your drink. In the [place] one drink equals one 12 oz bottle of beer (355 mL), one 5 oz  glass of wine (148 mL), or one 1 oz glass of hard liquor (44 mL). Lifestyle   Work with your doctor to stay at a healthy weight or to lose weight. Ask your doctor what the best weight is for you.  Get at least 30 minutes of exercise most days of the week. This may include walking, swimming, or biking.  Get at least 30 minutes of exercise that strengthens your muscles (resistance exercise) at least 3 days a week. This may include lifting weights or doing Pilates.  Do not use any products that contain nicotine or tobacco, such as cigarettes, e-cigarettes, and chewing tobacco. If you need help quitting, ask your doctor.  Check your blood pressure at home as told by your doctor.  Keep all follow-up visits as told by your doctor. This is important. Medicines  Take over-the-counter and prescription medicines only as told by your doctor. Follow directions carefully.  Do not skip doses of blood pressure medicine. The medicine does not work as well if you skip doses. Skipping doses also puts you at risk for problems.  Ask your doctor about side effects or reactions to medicines that you should watch for. Contact a doctor  if you:  Think you are having a reaction to the medicine you are taking.  Have headaches that keep coming back (recurring).  Feel dizzy.  Have swelling in your ankles.  Have trouble with your vision. Get help right away if you:  Get a very bad headache.  Start to feel mixed up (confused).  Feel weak or numb.  Feel faint.  Have very bad pain in your: ? Chest. ? Belly (abdomen).  Throw up more than once.  Have trouble breathing. Summary  Hypertension is another name for high blood pressure.  High blood pressure forces your heart to work harder to pump blood.  For most people, a normal blood pressure is less than 120/80.  Making healthy choices can help lower blood pressure. If your blood pressure does not get lower with healthy choices, you may need to  take medicine. This information is not intended to replace advice given to you by your health care provider. Make sure you discuss any questions you have with your health care provider. Document Revised: 11/22/2017 Document Reviewed: 11/22/2017 Elsevier Patient Education  2020 [employer].

## 2020-03-18 DIAGNOSIS — E559 Vitamin D deficiency, unspecified: Secondary | ICD-10-CM | POA: Insufficient documentation

## 2020-03-22 ENCOUNTER — Encounter: Payer: Self-pay | Admitting: Adult Health

## 2020-04-20 ENCOUNTER — Ambulatory Visit: Payer: Self-pay

## 2020-04-20 DIAGNOSIS — Z20822 Contact with and (suspected) exposure to covid-19: Secondary | ICD-10-CM

## 2020-04-20 NOTE — Telephone Encounter (Signed)
Call to patient- patient states he was exposed to COVID in a social setting on Saturday and he has his normal morning congestion- but no other symptoms. Patient has had all vaccines and wants to know what he should do from here- Advised per CDC recommendation: wear mask aroung others for 10 days and test on day 5 or with symptoms development. Patient wants to know if he can get tested at office- note sent for review and call back. Reason for Disposition . [1] CLOSE CONTACT COVID-19 EXPOSURE within last 14 days AND [2] NO symptoms  Answer Assessment - Initial Assessment Questions 1. COVID-19 EXPOSURE: "Please describe how you were exposed to someone with a COVID-19 infection."     Direct contact 2. PLACE of CONTACT: "Where were you when you were exposed to COVID-19?" (e.g., home, school, medical waiting room; which city?)     social contact 3. TYPE of CONTACT: "How much contact was there?" (e.g., sitting next to, live in same house, work in same office, same building)     Over 2 hours in same room 4. DURATION of CONTACT: "How long were you in contact with the COVID-19 patient?" (e.g., a few seconds, passed by person, a few minutes, 15 minutes or longer, live with the patient)     Over 2 hours 5. MASK: "Were you wearing a mask?" "Was the other person wearing a mask?" Note: wearing a mask reduces the risk of an otherwise close contact.     no 6. DATE of CONTACT: "When did you have contact with a COVID-19 patient?" (e.g., how many days ago)     Saturday night 7. COMMUNITY SPREAD: "Are there lots of cases of COVID-19 (community spread) where you live?" (See public health department website, if unsure)       yes 8. SYMPTOMS: "Do you have any symptoms?" (e.g., fever, cough, breathing difficulty, loss of taste or smell)     Congestion in am 9. VACCINE: "Have you gotten the COVID-19 vaccine?" If Yes ask: "Which one, how many shots, when did you get it?"     Yes- pfizer and booster  10. PREGNANCY OR  POSTPARTUM: "Is there any chance you are pregnant?" "When was your last menstrual period?" "Did you deliver in the last 2 weeks?"       n/a 11. HIGH RISK: "Do you have any heart or lung problems?" "Do you have a weak immune system?" (e.g., heart failure, COPD, asthma, HIV positive, chemotherapy, renal failure, diabetes mellitus, sickle cell anemia, obesity)       High BP 12. TRAVEL: "Have you traveled out of the country recently?" If Yes, ask: "When and where?" Also ask about out-of-state travel, since the CDC has identified some high-risk cities for community spread in the [place]. Note: Travel becomes less relevant if there is widespread community transmission where the patient lives.       no  Protocols used: CORONAVIRUS (COVID-19) EXPOSURE-A-AH

## 2020-04-20 NOTE — Telephone Encounter (Signed)
Okay to order test if exposure

## 2020-04-20 NOTE — Telephone Encounter (Signed)
Patient called, left VM to return the call to the office to speak to a [place] about his symptoms.   Message from Gibson sent at 04/20/2020 10:02 AM EST  Pt calling stating that he was directly in contact with someone with covid on 04/18/20. He states that since then he has had some congestion and is requesting to know what he should do. Please advise.

## 2020-04-20 NOTE — Telephone Encounter (Signed)
Okay to place order? KW 

## 2020-04-21 DIAGNOSIS — Z20822 Contact with and (suspected) exposure to covid-19: Secondary | ICD-10-CM | POA: Diagnosis not present

## 2020-04-21 NOTE — Telephone Encounter (Signed)
Patient has been advised. KW 

## 2020-04-21 NOTE — Telephone Encounter (Signed)
Noted  

## 2020-04-23 ENCOUNTER — Other Ambulatory Visit: Payer: Self-pay | Admitting: Adult Health

## 2020-04-23 LAB — COVID-19, FLU A+B AND RSV
Influenza A, NAA: NOT DETECTED
Influenza B, NAA: NOT DETECTED
RSV, NAA: NOT DETECTED
SARS-CoV-2, NAA: NOT DETECTED

## 2020-04-23 NOTE — Telephone Encounter (Signed)
Negative for covid, flu and rsv.

## 2020-04-23 NOTE — Telephone Encounter (Signed)
Medication Refill - Medication: hydrochlorothiazide (HYDRODIURIL) 25 MG tablet     Preferred Pharmacy (with phone number or street name):  [id number] [place], [place] - [address] Phone:  [phone]  Fax:  [phone]       Agent: Please be advised that RX refills may take up to 3 business days. We ask that you follow-up with your pharmacy.

## 2020-05-08 ENCOUNTER — Emergency Department: Payer: BC Managed Care – PPO

## 2020-05-08 ENCOUNTER — Emergency Department
Admission: EM | Admit: 2020-05-08 | Discharge: 2020-05-08 | Disposition: A | Discharge status: Home / Self Care | Payer: BC Managed Care – PPO | Attending: Emergency Medicine | Admitting: Emergency Medicine

## 2020-05-08 DIAGNOSIS — Z79899 Other long term (current) drug therapy: Secondary | ICD-10-CM | POA: Insufficient documentation

## 2020-05-08 DIAGNOSIS — Z87891 Personal history of nicotine dependence: Secondary | ICD-10-CM | POA: Insufficient documentation

## 2020-05-08 DIAGNOSIS — R064 Hyperventilation: Secondary | ICD-10-CM | POA: Diagnosis not present

## 2020-05-08 DIAGNOSIS — R0902 Hypoxemia: Secondary | ICD-10-CM | POA: Diagnosis not present

## 2020-05-08 DIAGNOSIS — R079 Chest pain, unspecified: Secondary | ICD-10-CM | POA: Diagnosis not present

## 2020-05-08 DIAGNOSIS — R069 Unspecified abnormalities of breathing: Secondary | ICD-10-CM | POA: Diagnosis not present

## 2020-05-08 DIAGNOSIS — R0602 Shortness of breath: Secondary | ICD-10-CM | POA: Diagnosis not present

## 2020-05-08 DIAGNOSIS — R0789 Other chest pain: Secondary | ICD-10-CM | POA: Diagnosis not present

## 2020-05-08 DIAGNOSIS — J9801 Acute bronchospasm: Secondary | ICD-10-CM | POA: Diagnosis not present

## 2020-05-08 DIAGNOSIS — I10 Essential (primary) hypertension: Secondary | ICD-10-CM | POA: Insufficient documentation

## 2020-05-08 LAB — BASIC METABOLIC PANEL
Anion gap: 10 (ref 5–15)
BUN: 17 mg/dL (ref 6–20)
CO2: 25 mmol/L (ref 22–32)
Calcium: 8.6 mg/dL — ABNORMAL LOW (ref 8.9–10.3)
Chloride: 104 mmol/L (ref 98–111)
Creatinine, Ser: 0.98 mg/dL (ref 0.61–1.24)
GFR, Estimated: >60 mL/min (ref >60)
Glucose, Bld: 147 mg/dL — ABNORMAL HIGH (ref 70–99)
Potassium: 3.6 mmol/L (ref 3.5–5.1)
Sodium: 139 mmol/L (ref 135–145)

## 2020-05-08 LAB — CBC WITH DIFFERENTIAL/PLATELET
Abs Immature Granulocytes: 0.03 10*3/uL (ref 0.00–0.07)
Basophils Absolute: 0 10*3/uL (ref 0.0–0.1)
Basophils Relative: 0 %
Eosinophils Absolute: 0.1 10*3/uL (ref 0.0–0.5)
Eosinophils Relative: 1 %
HCT: 39.7 % (ref 39.0–52.0)
Hemoglobin: 13 g/dL (ref 13.0–17.0)
Immature Granulocytes: 0 %
Lymphocytes Relative: 23 %
Lymphs Abs: 2 10*3/uL (ref 0.7–4.0)
MCH: 28.5 pg (ref 26.0–34.0)
MCHC: 32.7 g/dL (ref 30.0–36.0)
MCV: 87.1 fL (ref 80.0–100.0)
Monocytes Absolute: 0.8 10*3/uL (ref 0.1–1.0)
Monocytes Relative: 9 %
Neutro Abs: 5.9 10*3/uL (ref 1.7–7.7)
Neutrophils Relative %: 67 %
Platelets: 267 10*3/uL (ref 150–400)
RBC: 4.56 MIL/uL (ref 4.22–5.81)
RDW: 13.9 % (ref 11.5–15.5)
WBC: 8.8 10*3/uL (ref 4.0–10.5)
nRBC: 0 % (ref 0.0–0.2)

## 2020-05-08 LAB — TROPONIN I (HIGH SENSITIVITY)
Troponin I (High Sensitivity): 8 ng/L (ref <18)
Troponin I (High Sensitivity): 8 ng/L (ref <18)

## 2020-05-08 MED ORDER — ALBUTEROL SULFATE HFA 108 (90 BASE) MCG/ACT IN AERS
2.0000 | INHALATION_SPRAY | Freq: Four times a day (QID) | RESPIRATORY_TRACT | 2 refills | Status: DC | PRN
Start: 2020-05-08 — End: 2021-01-04

## 2020-05-08 MED ORDER — IPRATROPIUM-ALBUTEROL 0.5-2.5 (3) MG/3ML IN SOLN
3.0000 mL | Freq: Once | RESPIRATORY_TRACT | Status: AC
  Administered 2020-05-08: 3 mL via RESPIRATORY_TRACT
  Filled 2020-05-08: qty 3

## 2020-05-08 NOTE — Discharge Instructions (Signed)
Your presentation was most likely caused by bronchospasms induced by the CBD gummy. I would recommend avoiding it. I am giving a prescription for an albuterol inhaler in case her symptoms recur. If you have new or worsening chest pain, shortness of breath, or fever please return to the emergency room. Otherwise follow-up with your primary care doctor.

## 2020-05-08 NOTE — ED Triage Notes (Signed)
45 y/o male arrived to the [facility] by EMS with a CC of chest pain and SOB. PT states he has a history of anxiety. Pt notes he took a CBD gummy and afterwards started to feel SOB and chest pressure. Pt denies having CHF or COPD. Pt denies being on oxygen at home

## 2020-05-08 NOTE — ED Provider Notes (Signed)
[facility] Emergency Department Provider Note  ____________________________________________  Time seen: Approximately 5:08 AM  I have reviewed the triage vital signs and the nursing notes.   HISTORY  Chief Complaint Chest Pain and Shortness of Breath   HPI Steve [name] is a 45 y.o. male with a history of obesity, obstructive sleep apnea, anxiety who presents for evaluation of chest tightness and shortness of breath. The patient reports that he was in usual state of health went to sleep last night. He woke up at 1AM and was having a hard time falling asleep again. Patient did not have his CPAP machine on. He took a CBD gummy that he purchased and a vape store. This is the first time that he has taken the CBD gummy. He has used the oil before. He reports that he was able to fall asleep but woke up about an hour later feeling pressure in his chest and difficulty breathing.  Patient is a former smoker but denies history of COPD, emphysema or asthma. No history of CAD, PE or DVT, no recent travel immobilization, no leg pain or swelling, no hemoptysis or exogenous hormones. No fever or cough.  Past Medical History:  Diagnosis Date  . Gall bladder inflammation   . Hypertension   . Obstructive sleep apnea     Patient Active Problem List   Diagnosis Date Noted  . Vitamin D insufficiency 03/18/2020  . Calculus of gallbladder without cholecystitis without obstruction 06/20/2019  . Atypical chest pain 05/28/2019  . Shortness of breath 05/28/2019  . Abnormal EKG 05/28/2019  . High triglycerides 05/23/2019  . History of cholelithiasis 05/23/2019  . Hypertension 05/23/2019  . Morbid obesity (HCC) 04/22/2019  . Use of cannabis oil 04/22/2019  . Fatigue 04/22/2019  . Encounter for annual physical exam 04/22/2019  . RUQ abdominal pain 04/22/2019  . Paresthesia of both hands 04/22/2019  . Bilateral wrist pain 04/22/2019  . Morbid obesity with BMI of  50.0-59.9, adult (HCC) 04/22/2019  . History of chest pain 04/22/2019  . Anxiety 04/22/2019  . Family history of coronary artery disease 04/22/2019    Past Surgical History:  Procedure Laterality Date  . ADENOIDECTOMY      Prior to Admission medications   Medication Sig Start Date End Date Taking? Authorizing Provider  albuterol (VENTOLIN HFA) 108 (90 Base) MCG/ACT inhaler Inhale 2 puffs into the lungs every 6 (six) hours as needed for wheezing or shortness of breath. 05/08/20  Yes Harshita Bernales, [place], MD  hydrochlorothiazide (HYDRODIURIL) 25 MG tablet Take 0.5 tablets (12.5 mg total) by mouth daily. 03/16/20   Flinchum, Eula Fried, FNP  losartan (COZAAR) 25 MG tablet Take 1 tablet (25 mg total) by mouth daily. 03/16/20   Flinchum, Eula Fried, FNP  UNABLE TO FIND CBD oil prn    [provider]    Allergies Patient has no known allergies.  Family History  Problem Relation Age of Onset  . Diabetes Mother   . Diabetes Maternal Grandfather   . Heart attack Father 10    Social History Social History   Tobacco Use  . Smoking status: Former Smoker    Packs/day: 0.25    Years: 0.50    Pack years: 0.12    Types: Cigarettes    Quit date: 07/29/1999    Years since quitting: 20.7  . Smokeless tobacco: Former Neurosurgeon    Types: Snuff    Quit date: 08/2018  Vaping Use  . Vaping Use: Never used  Substance  Use Topics  . Alcohol use: Yes    Alcohol/week: 2.0 - 3.0 standard drinks    Types: 2 - 3 Cans of beer per week  . Drug use: No    Review of Systems  Constitutional: Negative for fever. Eyes: Negative for visual changes. ENT: Negative for sore throat. Neck: No neck pain  Cardiovascular: Negative for chest pain. + Chest tightness Respiratory: + shortness of breath. Gastrointestinal: Negative for abdominal pain, vomiting or diarrhea. Genitourinary: Negative for dysuria. Musculoskeletal: Negative for back pain. Skin: Negative for rash. Neurological: Negative for  headaches, weakness or numbness. Psych: No SI or HI  ____________________________________________   PHYSICAL EXAM:  VITAL SIGNS: ED Triage Vitals [05/08/20 0446]  Enc Vitals Group     BP (!) 138/92     Pulse Rate 80     Resp (!) 26     Temp 98.3 F (36.8 C)     Temp Source Oral     SpO2 95 %     Weight      Height      Head Circumference      Peak Flow      Pain Score 5     Pain Loc      Pain Edu?      Excl. in GC?     Constitutional: Alert and oriented, looks slightly anxious but in no distress HEENT:      Head: Normocephalic and atraumatic.         Eyes: Conjunctivae are normal. Sclera is non-icteric.       Mouth/Throat: Mucous membranes are moist.       Neck: Supple with no signs of meningismus. Cardiovascular: Regular rate and rhythm. No murmurs, gallops, or rubs. 2+ symmetrical distal pulses are present in all extremities. No JVD. Respiratory: Tachypneic with slightly increased work of breathing, normal sats, diminished air movement bilaterally with no wheezing or crackles  gastrointestinal: Soft, non tender. Musculoskeletal: No edema, cyanosis, or erythema of extremities. Neurologic: Normal speech and language. Face is symmetric. Moving all extremities. No gross focal neurologic deficits are appreciated. Skin: Skin is warm, dry and intact. No rash noted. Psychiatric: Mood and affect are normal. Speech and behavior are normal.  ____________________________________________   LABS (all labs ordered are listed, but only abnormal results are displayed)  Labs Reviewed  BASIC METABOLIC PANEL - Abnormal; Notable for the following components:      Result Value   Glucose, Bld 147 (*)    Calcium 8.6 (*)    All other components within normal limits  CBC WITH DIFFERENTIAL/PLATELET  TROPONIN I (HIGH SENSITIVITY)  TROPONIN I (HIGH SENSITIVITY)   ____________________________________________  EKG  ED ECG REPORT I, Nita Sickle, the attending physician,  personally viewed and interpreted this ECG.  Normal sinus rhythm, rate of 84, normal intervals, LAD, no ST elevations or depressions. Unchanged from prior from June 2021. ____________________________________________  RADIOLOGY  I have personally reviewed the images performed during this visit and I agree with the Radiologist's read.   Interpretation by Radiologist:  DG Chest Portable 1 View  Result Date: 05/08/2020 CLINICAL DATA:  45 year old male with chest pain and shortness of breath after CBD gummy. EXAM: PORTABLE CHEST 1 VIEW COMPARISON:  Chest radiographs 03/26/2019 and earlier. FINDINGS: Portable AP upright view at 0450 hours. Mildly lower lung volumes. Mediastinal contours are stable and within normal limits. Visualized tracheal air column is within normal limits. Allowing for portable technique the lungs are clear. No osseous abnormality identified. IMPRESSION: Negative portable chest.  Electronically Signed   By: Odessa Fleming M.D.   On: 05/08/2020 05:12      ____________________________________________   PROCEDURES  Procedure(s) performed:yes .1-3 Lead EKG Interpretation Performed by: Nita Sickle, MD Authorized by: Nita Sickle, MD     Interpretation: non-specific     ECG rate assessment: normal     Rhythm: sinus rhythm     Ectopy: none     Critical Care performed:  None ____________________________________________   INITIAL IMPRESSION / ASSESSMENT AND PLAN / ED COURSE   45 y.o. male with a history of obesity, obstructive sleep apnea, anxiety who presents for evaluation of chest tightness and shortness of breath that occur suddenly this morning an hour after trying a CBD gummy for the first time. Patient slight increased work of breathing and tachypneic with normal oxygenation and slightly decreased air movement bilaterally with no wheezing or crackles. Patient looks euvolemic. EKG with no signs of acute ischemia. Patient placed on telemetry for close  cardiorespiratory monitoring. Patient with a history of former smoking possible bronchospasms in the setting of a CBD gummy. We will treat with a DuoNeb. Chest x-ray and labs are pending.  Ddx bronchospasms, edema, PTX, ACS, PE, dissection, pericardial effusion.  Old medical records reviewed.  _________________________ 5:39 AM on 05/08/2020 -----------------------------------------  Patient reassessed after DuoNeb treatment and reports resolution of his chest tightness and shortness of breath. Moving good air with no wheezing or crackles.  Chest x-ray visualized by me with no acute abnormalities, confirmed by radiology.  First troponin is negative.  Blood work with no significant derangements.  We will continue to monitor and get a repeat troponin.  _________________________ 6:59 AM on 05/08/2020 -----------------------------------------  Repeat troponin remains negative.  Patient remains asymptomatic with no further episodes of chest tightness or shortness of breath.  At this time he stable for discharge home.  Recommended avoiding the CBD Gummies and follow-up with PCP.  Discussed my standard return precautions    _____________________________________________ Please note:  Patient was evaluated in Emergency Department today for the symptoms described in the history of present illness. Patient was evaluated in the context of the global COVID-19 pandemic, which necessitated consideration that the patient might be at risk for infection with the SARS-CoV-2 virus that causes COVID-19. Institutional protocols and algorithms that pertain to the evaluation of patients at risk for COVID-19 are in a state of rapid change based on information released by regulatory bodies including the CDC and federal and state organizations. These policies and algorithms were followed during the patient's care in the ED.  Some ED evaluations and interventions may be delayed as a result of limited staffing during the  pandemic.   [place] Controlled Substance Database was reviewed by me. ____________________________________________   FINAL CLINICAL IMPRESSION(S) / ED DIAGNOSES   Final diagnoses:  Bronchospasm      NEW MEDICATIONS STARTED DURING THIS VISIT:  ED Discharge Orders         Ordered    albuterol (VENTOLIN HFA) 108 (90 Base) MCG/ACT inhaler  Every 6 hours PRN        05/08/20 0605           Note:  This document was prepared using Dragon voice recognition software and may include unintentional dictation errors.    Don Perking, [place], MD 05/08/20 0700

## 2020-06-12 ENCOUNTER — Ambulatory Visit: Payer: Self-pay | Admitting: Adult Health

## 2020-06-15 ENCOUNTER — Ambulatory Visit: Payer: Self-pay | Admitting: Adult Health

## 2020-06-24 ENCOUNTER — Ambulatory Visit: Payer: Self-pay | Admitting: Adult Health

## 2020-06-29 ENCOUNTER — Ambulatory Visit: Payer: Self-pay | Admitting: Adult Health

## 2020-07-01 ENCOUNTER — Ambulatory Visit: Payer: Self-pay | Admitting: Adult Health

## 2020-07-06 ENCOUNTER — Other Ambulatory Visit: Payer: Self-pay

## 2020-07-06 ENCOUNTER — Encounter: Payer: Self-pay | Admitting: Adult Health

## 2020-07-06 ENCOUNTER — Ambulatory Visit (INDEPENDENT_AMBULATORY_CARE_PROVIDER_SITE_OTHER): Payer: BC Managed Care – PPO | Admitting: Adult Health

## 2020-07-06 VITALS — BP 159/97 | HR 70 | Resp 16 | Wt 387.0 lb

## 2020-07-06 DIAGNOSIS — R5383 Other fatigue: Secondary | ICD-10-CM | POA: Diagnosis not present

## 2020-07-06 DIAGNOSIS — G4730 Sleep apnea, unspecified: Secondary | ICD-10-CM

## 2020-07-06 DIAGNOSIS — I10 Essential (primary) hypertension: Secondary | ICD-10-CM | POA: Diagnosis not present

## 2020-07-06 DIAGNOSIS — J40 Bronchitis, not specified as acute or chronic: Secondary | ICD-10-CM

## 2020-07-06 DIAGNOSIS — R059 Cough, unspecified: Secondary | ICD-10-CM | POA: Diagnosis not present

## 2020-07-06 DIAGNOSIS — Z6841 Body Mass Index (BMI) 40.0 and over, adult: Secondary | ICD-10-CM

## 2020-07-06 MED ORDER — PREDNISONE 10 MG (21) PO TBPK: ORAL_TABLET | ORAL | 0 refills | Status: DC

## 2020-07-06 MED ORDER — HYDROCHLOROTHIAZIDE 25 MG PO TABS
25.0000 mg | ORAL_TABLET | Freq: Every day | ORAL | 3 refills | Status: DC
Start: 2020-07-06 — End: 2021-07-29

## 2020-07-06 MED ORDER — LEVOCETIRIZINE DIHYDROCHLORIDE 5 MG PO TABS: 5.0000 mg | ORAL_TABLET | Freq: Every evening | ORAL | 1 refills | Status: DC

## 2020-07-06 MED ORDER — LOSARTAN POTASSIUM 25 MG PO TABS: 37.5000 mg | ORAL_TABLET | Freq: Every day | ORAL | 1 refills | Status: DC

## 2020-07-06 MED ORDER — AMOXICILLIN-POT CLAVULANATE 875-125 MG PO TABS: 1.0000 | ORAL_TABLET | Freq: Two times a day (BID) | ORAL | 0 refills | Status: DC

## 2020-07-06 NOTE — Patient Instructions (Addendum)
Go to the HCTZ 25 mg once daily.  With losartan 25 mg if BP readings over 130/80 at home increase losartan to 1.5 tablets 37.5 mg.   Managing Your Hypertension Hypertension, also called high blood pressure, is when the force of the blood pressing against the walls of the arteries is too strong. Arteries are blood vessels that carry blood from your heart throughout your body. Hypertension forces the heart to work harder to pump blood and may cause the arteries to become narrow or stiff. Understanding blood pressure readings Your personal target blood pressure may vary depending on your medical conditions, your age, and other factors. A blood pressure reading includes a higher number over a lower number. Ideally, your blood pressure should be below 120/80. You should know that:  The first, or top, number is called the systolic pressure. It is a measure of the pressure in your arteries as your heart beats.  The second, or bottom number, is called the diastolic pressure. It is a measure of the pressure in your arteries as the heart relaxes. Blood pressure is classified into four stages. Based on your blood pressure reading, your health care provider may use the following stages to determine what type of treatment you need, if any. Systolic pressure and diastolic pressure are measured in a unit called mmHg. Normal  Systolic pressure: below 120.  Diastolic pressure: below 80. Elevated  Systolic pressure: 120-129.  Diastolic pressure: below 80. Hypertension stage 1  Systolic pressure: 130-139.  Diastolic pressure: 80-89. Hypertension stage 2  Systolic pressure: 140 or above.  Diastolic pressure: 90 or above. How can this condition affect me? Managing your hypertension is an important responsibility. Over time, hypertension can damage the arteries and decrease blood flow to important parts of the body, including the brain, heart, and kidneys. Having untreated or uncontrolled hypertension  can lead to:  A heart attack.  A stroke.  A weakened blood vessel (aneurysm).  Heart failure.  Kidney damage.  Eye damage.  Metabolic syndrome.  Memory and concentration problems.  Vascular dementia. What actions can I take to manage this condition? Hypertension can be managed by making lifestyle changes and possibly by taking medicines. Your health care provider will help you make a plan to bring your blood pressure within a normal range. Nutrition  Eat a diet that is high in fiber and potassium, and low in salt (sodium), added sugar, and fat. An example eating plan is called the Dietary Approaches to Stop Hypertension (DASH) diet. To eat this way: ? Eat plenty of fresh fruits and vegetables. Try to fill one-half of your plate at each meal with fruits and vegetables. ? Eat whole grains, such as whole-wheat pasta, brown rice, or whole-grain bread. Fill about one-fourth of your plate with whole grains. ? Eat low-fat dairy products. ? Avoid fatty cuts of meat, processed or cured meats, and poultry with skin. Fill about one-fourth of your plate with lean proteins such as fish, chicken without skin, beans, eggs, and tofu. ? Avoid pre-made and processed foods. These tend to be higher in sodium, added sugar, and fat.  Reduce your daily sodium intake. Most people with hypertension should eat less than 1,500 mg of sodium a day.   Lifestyle  Work with your health care provider to maintain a healthy body weight or to lose weight. Ask what an ideal weight is for you.  Get at least 30 minutes of exercise that causes your heart to beat faster (aerobic exercise) most days of the  week. Activities may include walking, swimming, or biking.  Include exercise to strengthen your muscles (resistance exercise), such as weight lifting, as part of your weekly exercise routine. Try to do these types of exercises for 30 minutes at least 3 days a week.  Do not use any products that contain nicotine or  tobacco, such as cigarettes, e-cigarettes, and chewing tobacco. If you need help quitting, ask your health care provider.  Control any long-term (chronic) conditions you have, such as high cholesterol or diabetes.  Identify your sources of stress and find ways to manage stress. This may include meditation, deep breathing, or making time for fun activities.   Alcohol use  Do not drink alcohol if: ? Your health care provider tells you not to drink. ? You are pregnant, may be pregnant, or are planning to become pregnant.  If you drink alcohol: ? Limit how much you use to:  0-1 drink a day for women.  0-2 drinks a day for men. ? Be aware of how much alcohol is in your drink. In the [place] one drink equals one 12 oz bottle of beer (355 mL), one 5 oz glass of wine (148 mL), or one 1 oz glass of hard liquor (44 mL). Medicines Your health care provider may prescribe medicine if lifestyle changes are not enough to get your blood pressure under control and if:  Your systolic blood pressure is 130 or higher.  Your diastolic blood pressure is 80 or higher. Take medicines only as told by your health care provider. Follow the directions carefully. Blood pressure medicines must be taken as told by your health care provider. The medicine does not work as well when you skip doses. Skipping doses also puts you at risk for problems. Monitoring Before you monitor your blood pressure:  Do not smoke, drink caffeinated beverages, or exercise within 30 minutes before taking a measurement.  Use the bathroom and empty your bladder (urinate).  Sit quietly for at least 5 minutes before taking measurements. Monitor your blood pressure at home as told by your health care provider. To do this:  Sit with your back straight and supported.  Place your feet flat on the floor. Do not cross your legs.  Support your arm on a flat surface, such as a table. Make sure your upper arm is at heart level.  Each time you  measure, take two or three readings one minute apart and record the results. You may also need to have your blood pressure checked regularly by your health care provider.   General information  Talk with your health care provider about your diet, exercise habits, and other lifestyle factors that may be contributing to hypertension.  Review all the medicines you take with your health care provider because there may be side effects or interactions.  Keep all visits as told by your health care provider. Your health care provider can help you create and adjust your plan for managing your high blood pressure. Where to find more information  National Heart, Lung, and Blood Institute: PopSteam.is  American Heart Association: www.heart.org Contact a health care provider if:  You think you are having a reaction to medicines you have taken.  You have repeated (recurrent) headaches.  You feel dizzy.  You have swelling in your ankles.  You have trouble with your vision. Get help right away if:  You develop a severe headache or confusion.  You have unusual weakness or numbness, or you feel faint.  You have severe  pain in your chest or abdomen.  You vomit repeatedly.  You have trouble breathing. These symptoms may represent a serious problem that is an emergency. Do not wait to see if the symptoms will go away. Get medical help right away. Call your local emergency services (911 in the [place] Do not drive yourself to the hospital. Summary  Hypertension is when the force of blood pumping through your arteries is too strong. If this condition is not controlled, it may put you at risk for serious complications.  Your personal target blood pressure may vary depending on your medical conditions, your age, and other factors. For most people, a normal blood pressure is less than 120/80.  Hypertension is managed by lifestyle changes, medicines, or both.  Lifestyle changes to help manage  hypertension include losing weight, eating a healthy, low-sodium diet, exercising more, stopping smoking, and limiting alcohol. This information is not intended to replace advice given to you by your health care provider. Make sure you discuss any questions you have with your health care provider. Document Revised: 04/19/2019 Document Reviewed: 02/12/2019 Elsevier Patient Education  2021 [employer]. Costochondritis  Costochondritis is irritation and swelling (inflammation) of the tissue that connects the ribs to the breastbone (sternum). This tissue is called cartilage. Costochondritis causes pain in the front of the chest. Usually, the pain:  Starts slowly.  Is in more than one rib. What are the causes? The exact cause of this condition is not always known. It results from stress on the tissue in the affected area. The cause of this stress could be:  Chest injury.  Exercise or activity, such as lifting.  Very bad coughing. What increases the risk? You are more likely to develop this condition if you:  Are male.  Are 34-70 years old.  Recently started a new exercise or work activity.  Have low levels of vitamin D.  Have a condition that makes you cough often. What are the signs or symptoms? The main symptom of this condition is chest pain. The pain:  Usually starts slowly and can be sharp or dull.  Gets worse with deep breathing, coughing, or exercise.  Gets better with rest.  May be worse when you press on the affected area of your ribs and breastbone. How is this treated? This condition usually goes away on its own over time. Your doctor may prescribe an NSAID, such as ibuprofen. This can help reduce pain and inflammation. Treatment may also include:  Resting and avoiding activities that make pain worse.  Putting heat or ice on the painful area.  Doing exercises to stretch your chest muscles. If these treatments do not help, your doctor may inject a numbing  medicine to help relieve the pain. Follow these instructions at home: Managing pain, stiffness, and swelling  If told, put ice on the painful area. To do this: ? Put ice in a plastic bag. ? Place a towel between your skin and the bag. ? Leave the ice on for 20 minutes, 2-3 times a day.  If told, put heat on the affected area. Do this as often as told by your doctor. Use the heat source that your doctor recommends, such as a moist heat pack or a heating pad. ? Place a towel between your skin and the heat source. ? Leave the heat on for 20-30 minutes. ? Take off the heat if your skin turns bright red. This is very important if you cannot feel pain, heat, or cold. You may have  a greater risk of getting burned.      Activity  Rest as told by your doctor.  Do not do anything that makes your pain worse. This includes any activities that use chest, belly (abdomen), and side muscles.  Do not lift anything that is heavier than 10 lb (4.5 kg), or the limit that you are told, until your doctor says that it is safe.  Return to your normal activities as told by your doctor. Ask your doctor what activities are safe for you. General instructions  Take over-the-counter and prescription medicines only as told by your doctor.  Keep all follow-up visits as told by your doctor. This is important. Contact a doctor if:  You have chills or a fever.  Your pain does not go away or it gets worse.  You have a cough that does not go away. Get help right away if:  You are short of breath.  You have very bad chest pain that is not helped by medicines, heat, or ice. These symptoms may be an emergency. Do not wait to see if the symptoms will go away. Get medical help right away. Call your local emergency services (911 in the [place] Do not drive yourself to the hospital. Summary  Costochondritis is irritation and swelling (inflammation) of the tissue that connects the ribs to the breastbone  (sternum).  This condition causes pain in the front of the chest.  Treatment may include medicines, rest, heat or ice, and exercises. This information is not intended to replace advice given to you by your health care provider. Make sure you discuss any questions you have with your health care provider. Document Revised: 01/25/2019 Document Reviewed: 01/25/2019 Elsevier Patient Education  2021 [employer] Inc. Acute Bronchitis, Adult  Acute bronchitis is when air tubes in the lungs (bronchi) suddenly get swollen. The condition can make it hard for you to breathe. In adults, acute bronchitis usually goes away within 2 weeks. A cough caused by bronchitis may last up to 3 weeks. Smoking, allergies, and asthma can make the condition worse. What are the causes? This condition is caused by:  Cold and flu viruses. The most common cause of this condition is the virus that causes the common cold.  Bacteria.  Substances that irritate the lungs, including: ? Smoke from cigarettes and other types of tobacco. ? Dust and pollen. ? Fumes from chemicals, gases, or burned fuel. ? Other materials that pollute indoor or outdoor air.  Close contact with someone who has acute bronchitis. What increases the risk? The following factors may make you more likely to develop this condition:  A weak body's defense system. This is also called the immune system.  Any condition that affects your lungs and breathing, such as asthma. What are the signs or symptoms? Symptoms of this condition include:  A cough.  Coughing up clear, yellow, or green mucus.  Wheezing.  Chest congestion.  Shortness of breath.  A fever.  Body aches.  Chills.  A sore throat. How is this treated? Acute bronchitis may go away over time without treatment. Your doctor may recommend:  Drinking more fluids.  Taking a medicine for a fever or cough.  Using a device that gets medicine into your lungs (inhaler).  Using a  vaporizer or a humidifier. These are machines that add water or moisture in the air to help with coughing and poor breathing. Follow these instructions at home: Activity  Get a lot of rest.  Avoid places where there are  fumes from chemicals.  Return to your normal activities as told by your doctor. Ask your doctor what activities are safe for you. Lifestyle  Drink enough fluids to keep your pee (urine) pale yellow.  Do not drink alcohol.  Do not use any products that contain nicotine or tobacco, such as cigarettes, e-cigarettes, and chewing tobacco. If you need help quitting, ask your doctor. Be aware that: ? Your bronchitis will get worse if you smoke or breathe in other people's smoke (secondhand smoke). ? Your lungs will heal faster if you quit smoking. General instructions  Take over-the-counter and prescription medicines only as told by your doctor.  Use an inhaler, cool mist vaporizer, or humidifier as told by your doctor.  Rinse your mouth often with salt water. To make salt water, dissolve -1 tsp (3-6 g) of salt in 1 cup (237 mL) of warm water.  Keep all follow-up visits as told by your doctor. This is important.   How is this prevented? To lower your risk of getting this condition again:  Wash your hands often with soap and water. If soap and water are not available, use hand sanitizer.  Avoid contact with people who have cold symptoms.  Try not to touch your mouth, nose, or eyes with your hands.  Make sure to get the flu shot every year.   Contact a doctor if:  Your symptoms do not get better in 2 weeks.  You vomit more than once or twice.  You have symptoms of loss of fluid from your body (dehydration). These include: ? Dark urine. ? Dry skin or eyes. ? Increased thirst. ? Headaches. ? Confusion. ? Muscle cramps. Get help right away if:  You cough up blood.  You have chest pain.  You have very bad shortness of breath.  You become  dehydrated.  You faint or keep feeling like you are going to faint.  You keep vomiting.  You have a very bad headache.  Your fever or chills get worse. These symptoms may be an emergency. Do not wait to see if the symptoms will go away. Get medical help right away. Call your local emergency services (911 in the [place] Do not drive yourself to the hospital. Summary  Acute bronchitis is when air tubes in the lungs (bronchi) suddenly get swollen. In adults, acute bronchitis usually goes away within 2 weeks.  Take over-the-counter and prescription medicines only as told by your doctor.  Drink enough fluid to keep your pee (urine) pale yellow.  Contact a doctor if your symptoms do not improve after 2 weeks of treatment.  Get help right away if you cough up blood, faint, or have chest pain or shortness of breath. This information is not intended to replace advice given to you by your health care provider. Make sure you discuss any questions you have with your health care provider. Document Revised: 10/05/2018 Document Reviewed: 10/05/2018 Elsevier Patient Education  2021 Else[employer] Inc. Amoxicillin; Clavulanic Acid Tablets What is this medicine? AMOXICILLIN; CLAVULANIC ACID (a mox i SIL in; KLAV yoo lan ic AS id) is a penicillin antibiotic. It treats some infections caused by bacteria. It will not work for colds, the flu, or other viruses. This medicine may be used for other purposes; ask your health care provider or pharmacist if you have questions. COMMON BRAND NAME(S): Augmentin What should I tell my health care provider before I take this medicine? They need to know if you have any of these conditions:  kidney disease  liver disease  mononucleosis  stomach or intestine problems such as colitis  an unusual or allergic reaction to amoxicillin, other penicillin or cephalosporin antibiotics, clavulanic acid, other medicines, foods, dyes, or preservatives  pregnant or trying to get  pregnant  breast-feeding How should I use this medicine? Take this drug by mouth. Take it as directed on the prescription label at the same time every day. Take it with food at the start of a meal or snack. Take all of this drug unless your health care provider tells you to stop it early. Keep taking it even if you think you are better. Talk to your health care provider about the use of this drug in children. While it may be prescribed for selected conditions, precautions do apply. Overdosage: If you think you have taken too much of this medicine contact a poison control center or emergency room at once. NOTE: This medicine is only for you. Do not share this medicine with others. What if I miss a dose? If you miss a dose, take it as soon as you can. If it is almost time for your next dose, take only that dose. Do not take double or extra doses. What may interact with this medicine?  allopurinol  anticoagulants  birth control pills  methotrexate  probenecid This list may not describe all possible interactions. Give your health care provider a list of all the medicines, herbs, non-prescription drugs, or dietary supplements you use. Also tell them if you smoke, drink alcohol, or use illegal drugs. Some items may interact with your medicine. What should I watch for while using this medicine? Tell your health care provider if your symptoms do not start to get better or if they get worse. This medicine may cause serious skin reactions. They can happen weeks to months after starting the medicine. Contact your health care provider right away if you notice fevers or flu-like symptoms with a rash. The rash may be red or purple and then turn into blisters or peeling of the skin. Or, you might notice a red rash with swelling of the face, lips or lymph nodes in your neck or under your arms. Do not treat diarrhea with over the counter products. Contact your health care provider if you have diarrhea that  lasts more than 2 days or if it is severe and watery. If you have diabetes, you may get a false-positive result for sugar in your urine. Check with your health care provider. Birth control may not work properly while you are taking this medicine. Talk to your health care provider about using an extra method of birth control. What side effects may I notice from receiving this medicine? Side effects that you should report to your doctor or health care provider as soon as possible:  allergic reactions (skin rash, itching or hives; swelling of the face, lips, or tongue)  bloody or watery diarrhea  dark urine  infection (fever, chills, cough, sore throat, or pain)  kidney injury (trouble passing urine or change in the amount of urine)  redness, blistering, peeling, or loosening of the skin, including inside the mouth  seizures  thrush (white patches in the mouth or mouth sores)  trouble breathing  unusual bruising or bleeding  unusually weak or tired Side effects that usually do not require medical attention (report to your doctor or health care provider if they continue or are bothersome):  diarrhea  dizziness  headache  nausea, vomiting  unusual vaginal discharge, itching, or odor  upset stomach This list may not describe all possible side effects. Call your doctor for medical advice about side effects. You may report side effects to FDA at 1-800-FDA-1088. Where should I keep my medicine? Keep out of the reach of children and pets. Store at room temperature between 20 and 25 degrees C (68 and 77 degrees F). Throw away any unused drug after the expiration date. NOTE: This sheet is a summary. It may not cover all possible information. If you have questions about this medicine, talk to your doctor, pharmacist, or health care provider.  2021 Elsevier/Gold Standard (2020-02-05 09:45:03) Prednisolone tablets What is this medicine? PREDNISOLONE (pred NISS oh lone) is a  corticosteroid. It is commonly used to treat inflammation of the skin, joints, lungs, and other organs. Common conditions treated include asthma, allergies, and arthritis. It is also used for other conditions, such as blood disorders and diseases of the adrenal glands. This medicine may be used for other purposes; ask your health care provider or pharmacist if you have questions. COMMON BRAND NAME(S): Millipred, Millipred DP, Millipred DP 12-Day, Millipred DP 6 Day, Prednoral What should I tell my health care provider before I take this medicine? They need to know if you have any of these conditions:  Cushing's syndrome  diabetes  glaucoma  heart problems or disease  high blood pressure  infection such as herpes, measles, tuberculosis, or chickenpox  kidney disease  liver disease  mental problems  myasthenia gravis  osteoporosis  seizures  stomach ulcer or intestine disease including colitis and diverticulitis  thyroid problem  an unusual or allergic reaction to lactose, prednisolone, other medicines, foods, dyes, or preservatives  pregnant or trying to get pregnant  breast-feeding How should I use this medicine? Take this medicine by mouth with a glass of water. Follow the directions on the prescription label. Take it with food or milk to avoid stomach upset. If you are taking this medicine once a day, take it in the morning. Do not take more medicine than you are told to take. Do not suddenly stop taking your medicine because you may develop a severe reaction. Your doctor will tell you how much medicine to take. If your doctor wants you to stop the medicine, the dose may be slowly lowered over time to avoid any side effects. Talk to your pediatrician regarding the use of this medicine in children. Special care may be needed. Overdosage: If you think you have taken too much of this medicine contact a poison control center or emergency room at once. NOTE: This medicine is  only for you. Do not share this medicine with others. What if I miss a dose? If you miss a dose, take it as soon as you can. If it is almost time for your next dose, take only that dose. Do not take double or extra doses. What may interact with this medicine? Do not take this medicine with any of the following medications:  metyrapone  mifepristone This medicine may also interact with the following medications:  aminoglutethimide  amphotericin B  aspirin and aspirin-like medicines  barbiturates  certain medicines for diabetes, like glipizide or glyburide  cholestyramine  cholinesterase inhibitors  cyclosporine  digoxin  diuretics  ephedrine  male hormones, like estrogens and birth control pills  isoniazid  ketoconazole  NSAIDS, medicines for pain and inflammation, like ibuprofen or naproxen  phenytoin  rifampin  toxoids  vaccines  warfarin This list may not describe all possible interactions. Give your health care provider  a list of all the medicines, herbs, non-prescription drugs, or dietary supplements you use. Also tell them if you smoke, drink alcohol, or use illegal drugs. Some items may interact with your medicine. What should I watch for while using this medicine? Visit your doctor or health care professional for regular checks on your progress. If you are taking this medicine over a prolonged period, carry an identification card with your name and address, the type and dose of your medicine, and your doctor's name and address. This medicine may increase your risk of getting an infection. Tell your doctor or health care professional if you are around anyone with measles or chickenpox, or if you develop sores or blisters that do not heal properly. If you are going to have surgery, tell your doctor or health care professional that you have taken this medicine within the last twelve months. Ask your doctor or health care professional about your diet. You  may need to lower the amount of salt you eat. This medicine may increase blood sugar. Ask your healthcare provider if changes in diet or medicines are needed if you have diabetes. What side effects may I notice from receiving this medicine? Side effects that you should report to your doctor or health care professional as soon as possible:  allergic reactions like skin rash, itching or hives, swelling of the face, lips, or tongue  changes in emotions or moods  eye pain   signs and symptoms of high blood sugar such as being more thirsty or hungry or having to urinate more than normal. You may also feel very tired or have blurry vision.  signs and symptoms of infection like fever or chills; cough; sore throat; pain or trouble passing urine  slow growth in children (if used for longer periods of time)  swelling of ankles, feet  trouble sleeping  weak bones (if used for longer periods of time) Side effects that usually do not require medical attention (report to your doctor or health care professional if they continue or are bothersome):  nausea  skin problems, acne, thin and shiny skin  upset stomach  weight gain This list may not describe all possible side effects. Call your doctor for medical advice about side effects. You may report side effects to FDA at 1-800-FDA-1088. Where should I keep my medicine? Keep out of the reach of children. Store at room temperature between 15 and 30 degrees C (59 and 86 degrees F). Keep container tightly closed. Throw away any unused medicine after the expiration date. NOTE: This sheet is a summary. It may not cover all possible information. If you have questions about this medicine, talk to your doctor, pharmacist, or health care provider.  2021 Elsevier/Gold Standard (2017-12-14 10:30:56) Levocetirizine Oral Tablets What is this medicine? LEVOCETIRIZINE (LEE voe se TIR i zeen) is an antihistamine. This medicine is used to treat or prevent  symptoms of allergies. It is also used to help reduce itchy skin rash and hives. This medicine may be used for other purposes; ask your health care provider or pharmacist if you have questions. COMMON BRAND NAME(S): Xyzal, Xyzal Allergy 24 Hour What should I tell my health care provider before I take this medicine? They need to know if you have any of these conditions:  kidney disease  an unusual or allergic reaction to levocetirizine, cetirizine, hydroxyzine, other medicines, foods, dyes, or preservatives  pregnant or trying to get pregnant  breast-feeding How should I use this medicine? Take this medicine by mouth  with a glass of water. Take it at night. The tablet may be split in half. Do not chew the tablets. Follow the directions on the prescription label. You can take it with or without food. Do not take more medicine than directed. You may need to take this medicine for several days before your symptoms improve. Talk to your pediatrician regarding the use of this medicine in children. While this drug may be prescribed for children as young as 55 years old for selected conditions, precautions do apply. Overdosage: If you think you have taken too much of this medicine contact a poison control center or emergency room at once. NOTE: This medicine is only for you. Do not share this medicine with others. What if I miss a dose? If you miss a dose, take it as soon as you can. If it is almost time for your next dose, take only that dose. Do not take double or extra doses. What may interact with this medicine?  alcohol  MAOIs like Carbex, Eldepryl, Marplan, Nardil, and Parnate  medicines that cause drowsiness or sleep  other medicines for colds or allergies  ritonavir  theophylline This list may not describe all possible interactions. Give your health care provider a list of all the medicines, herbs, non-prescription drugs, or dietary supplements you use. Also tell them if you smoke,  drink alcohol, or use illegal drugs. Some items may interact with your medicine. What should I watch for while using this medicine? Visit your doctor or health care professional for regular checks on your health. Tell your doctor or healthcare professional if your symptoms do not start to get better or if they get worse. You may get drowsy or dizzy. Do not drive, use machinery, or do anything that needs mental alertness until you know how this medicine affects you. Do not stand or sit up quickly, especially if you are an older patient. This reduces the risk of dizzy or fainting spells. Alcohol may interfere with the effect of this medicine. Avoid alcoholic drinks. Your mouth may get dry. Chewing sugarless gum or sucking hard candy, and drinking plenty of water may help. Contact your doctor if the problem does not go away or is severe. What side effects may I notice from receiving this medicine? Side effects that you should report to your doctor or health care professional as soon as possible:  allergic reactions like skin rash, itching or hives, swelling of the face, lips, or tongue  changes in vision or hearing  fever  trouble passing urine or change in the amount of urine Side effects that usually do not require medical attention (report to your doctor or health care professional if they continue or are bothersome):  cough  dizziness  drowsiness or tiredness  dry mouth  muscle aches This list may not describe all possible side effects. Call your doctor for medical advice about side effects. You may report side effects to FDA at 1-800-FDA-1088. Where should I keep my medicine? Keep out of the reach of children. Store at room temperature between 15 and 30 degrees C (59 and 86 degrees F). Throw away any unused medicine after the expiration date. NOTE: This sheet is a summary. It may not cover all possible information. If you have questions about this medicine, talk to your doctor,  pharmacist, or health care provider.  2021 Elsevier/Gold Standard (2007-12-05 11:17:47)

## 2020-07-06 NOTE — Progress Notes (Signed)
Established patient visit   Patient: Steve [name]   DOB: 09-18-1975   45 y.o. Male  MRN: [id number] Visit Date: 07/06/2020  Today's healthcare provider: Marcille Buffy, FNP   Chief Complaint  Patient presents with  . Hypertension   Subjective    HPI  Hypertension, follow-up  BP Readings from Last 3 Encounters:  07/06/20 (!) 159/97  05/08/20 (!) 136/106  03/16/20 (!) 142/77   Wt Readings from Last 3 Encounters:  07/06/20 (!) 387 lb (175.5 kg)  03/16/20 (!) 385 lb 6.4 oz (174.8 kg)  01/02/20 (!) 360 lb (163.3 kg)     He was last seen for hypertension 4  months ago.  BP at that visit was 142/77. Management since that visit includes continue Losartan 66m and HCTZ 281m  He reports good compliance with treatment. He is not having side effects.  He is following a Regular diet. He is not exercising. He does not smoke.  Use of agents associated with hypertension: none.   Outside blood pressures are not being checked. Symptoms: No chest pain No chest pressure  No palpitations No syncope  No dyspnea No orthopnea  No paroxysmal nocturnal dyspnea Yes lower extremity edema    Has had cough for around two weeks and negative covid, allergies also bothering him.  Taking Claritin D.   Patient  denies any fever, body aches,chills, rash, chest pain, shortness of breath, nausea, vomiting, or diarrhea.  Denies dizziness, lightheadedness, pre syncopal or syncopal episodes.   Pertinent labs: Lab Results  Component Value Date   CHOL 160 07/06/2020   HDL 30 (L) 07/06/2020   LDLCALC 94 07/06/2020   TRIG 209 (H) 07/06/2020   CHOLHDL 4.6 04/22/2019   Lab Results  Component Value Date   NA 140 07/06/2020   K 4.3 07/06/2020   CREATININE 0.74 (L) 07/06/2020   GFRNONAA >60 05/08/2020   GFRAA 123 04/22/2019   GLUCOSE 98 07/06/2020     The 10-year ASCVD risk score (GMikey BussingC Jr., et al., 2013) is: 3.6%    --------------------------------------------------------------------------------------------------- Patient Active Problem List   Diagnosis Date Noted  . Vitamin D insufficiency 03/18/2020  . Calculus of gallbladder without cholecystitis without obstruction 06/20/2019  . Atypical chest pain 05/28/2019  . Shortness of breath 05/28/2019  . Abnormal EKG 05/28/2019  . High triglycerides 05/23/2019  . History of cholelithiasis 05/23/2019  . Hypertension 05/23/2019  . Morbid obesity ([facility]25[place]  . Use of cannabis oil 04/22/2019  . Fatigue 04/22/2019  . Encounter for annual physical exam 04/22/2019  . RUQ abdominal pain 04/22/2019  . Paresthesia of both hands 04/22/2019  . Bilateral wrist pain 04/22/2019  . Morbid obesity with BMI of 50.0-59.9, adult ([facility]/25/2021  . History of chest pain 04/22/2019  . Anxiety 04/22/2019  . Family history of coronary artery disease 04/22/2019   Past Medical History:  Diagnosis Date  . Gall bladder inflammation   . Hypertension   . Obstructive sleep apnea    No Known Allergies     Medications: Outpatient Medications Prior to Visit  Medication Sig  . albuterol (VENTOLIN HFA) 108 (90 Base) MCG/ACT inhaler Inhale 2 puffs into the lungs every 6 (six) hours as needed for wheezing or shortness of breath.  . Marland KitchenNABLE TO FIND CBD oil prn  . [DISCONTINUED] hydrochlorothiazide (HYDRODIURIL) 25 MG tablet Take 0.5 tablets (12.5 mg total) by mouth daily.  . [DISCONTINUED] losartan (COZAAR) 25 MG tablet Take 1 tablet (25 mg total) by mouth  daily.   No facility-administered medications prior to visit.    Review of Systems  Constitutional: Negative.   HENT: Negative.   Respiratory: Positive for cough. Negative for apnea, choking, chest tightness, shortness of breath, wheezing and stridor.   Cardiovascular: Negative.   Gastrointestinal: Negative.   Genitourinary: Negative.   Musculoskeletal: Negative.   Neurological: Negative.    Psychiatric/Behavioral: Negative.        Objective    BP (!) 159/97   Pulse 70   Resp 16   Wt (!) 387 lb (175.5 kg)   SpO2 99%   BMI 55.53 kg/m     Physical Exam Constitutional:      General: He is not in acute distress.    Appearance: Normal appearance. He is well-developed. He is obese. He is not ill-appearing, toxic-appearing or diaphoretic.  HENT:     Head: Normocephalic and atraumatic.     Right Ear: Hearing, tympanic membrane, ear canal and external ear normal.     Left Ear: Hearing, tympanic membrane, ear canal and external ear normal.     Nose: Nose normal.     Mouth/Throat:     Pharynx: Uvula midline. No oropharyngeal exudate.  Eyes:     General: Lids are normal. No scleral icterus.       Right eye: No discharge.        Left eye: No discharge.     Conjunctiva/sclera: Conjunctivae normal.     Pupils: Pupils are equal, round, and reactive to light.  Neck:     Thyroid: No thyromegaly.     Vascular: Normal carotid pulses. No carotid bruit, hepatojugular reflux or JVD.     Trachea: Trachea and phonation normal. No tracheal tenderness or tracheal deviation.     Meningeal: Brudzinski's sign absent.  Cardiovascular:     Rate and Rhythm: Normal rate and regular rhythm.     Pulses: Normal pulses.     Heart sounds: Normal heart sounds, S1 normal and S2 normal. Heart sounds not distant. No murmur heard. No friction rub. No gallop.   Pulmonary:     Effort: Pulmonary effort is normal. No accessory muscle usage or respiratory distress.     Breath sounds: Normal breath sounds. No stridor. No wheezing or rales.  Chest:     Chest wall: No tenderness.  Abdominal:     General: Bowel sounds are normal. There is no distension.     Palpations: Abdomen is soft. There is no mass.     Tenderness: There is no abdominal tenderness. There is no guarding or rebound.  Musculoskeletal:        General: No tenderness or deformity. Normal range of motion.     Cervical back: Full passive  range of motion without pain, normal range of motion and neck supple.  Lymphadenopathy:     Head:     Right side of head: No submental, submandibular, tonsillar, preauricular, posterior auricular or occipital adenopathy.     Left side of head: No submental, submandibular, tonsillar, preauricular, posterior auricular or occipital adenopathy.     Cervical: No cervical adenopathy.  Skin:    General: Skin is warm and dry.     Coloration: Skin is not pale.     Findings: No erythema or rash.     Nails: There is no clubbing.  Neurological:     General: No focal deficit present.     Mental Status: He is alert and oriented to person, place, and time.     GCS: GCS  eye subscore is 4. GCS verbal subscore is 5. GCS motor subscore is 6.     Cranial Nerves: No cranial nerve deficit.     Sensory: No sensory deficit.     Motor: No abnormal muscle tone.     Coordination: Coordination normal.     Deep Tendon Reflexes: Reflexes are normal and symmetric. Reflexes normal.  Psychiatric:        Speech: Speech normal.        Behavior: Behavior normal.        Thought Content: Thought content normal.        Judgment: Judgment normal.       Results for orders placed or performed in visit on 07/06/20  CBC with Differential/Platelet  Result Value Ref Range   WBC 7.9 3.4 - 10.8 x10E3/uL   RBC 4.65 4.14 - 5.80 x10E6/uL   Hemoglobin 13.0 13.0 - 17.7 g/dL   Hematocrit 39.2 37.5 - 51.0 %   MCV 84 79 - 97 fL   MCH 28.0 26.6 - 33.0 pg   MCHC 33.2 31.5 - 35.7 g/dL   RDW 13.6 11.6 - 15.4 %   Platelets 292 150 - 450 x10E3/uL   Neutrophils 68 Not Estab. %   Lymphs 21 Not Estab. %   Monocytes 9 Not Estab. %   Eos 1 Not Estab. %   Basos 1 Not Estab. %   Neutrophils Absolute 5.3 1.4 - 7.0 x10E3/uL   Lymphocytes Absolute 1.7 0.7 - 3.1 x10E3/uL   Monocytes Absolute 0.7 0.1 - 0.9 x10E3/uL   EOS (ABSOLUTE) 0.1 0.0 - 0.4 x10E3/uL   Basophils Absolute 0.0 0.0 - 0.2 x10E3/uL   Immature Granulocytes 0 Not Estab. %    Immature Grans (Abs) 0.0 0.0 - 0.1 x10E3/uL  Comprehensive Metabolic Panel (CMET)  Result Value Ref Range   Glucose 98 65 - 99 mg/dL   BUN 8 6 - 24 mg/dL   Creatinine, Ser 0.74 (L) 0.76 - 1.27 mg/dL   eGFR 115 >59 mL/min/1.73   BUN/Creatinine Ratio 11 9 - 20   Sodium 140 134 - 144 mmol/L   Potassium 4.3 3.5 - 5.2 mmol/L   Chloride 100 96 - 106 mmol/L   CO2 26 20 - 29 mmol/L   Calcium 9.0 8.7 - 10.2 mg/dL   Total Protein 6.7 6.0 - 8.5 g/dL   Albumin 4.0 4.0 - 5.0 g/dL   Globulin, Total 2.7 1.5 - 4.5 g/dL   Albumin/Globulin Ratio 1.5 1.2 - 2.2   Bilirubin Total 0.4 0.0 - 1.2 mg/dL   Alkaline Phosphatase 75 44 - 121 IU/L   AST 27 0 - 40 IU/L   ALT 32 0 - 44 IU/L  TSH  Result Value Ref Range   TSH 3.070 0.450 - 4.500 uIU/mL  Lipid Panel w/o Chol/HDL Ratio  Result Value Ref Range   Cholesterol, Total 160 100 - 199 mg/dL   Triglycerides 209 (H) 0 - 149 mg/dL   HDL 30 (L) >39 mg/dL   VLDL Cholesterol Cal 36 5 - 40 mg/dL   LDL Chol Calc (NIH) 94 0 - 99 mg/dL    Assessment & Plan    Morbid obesity with BMI of 50.0-59.9, adult (HCC)  Hypertension, unspecified type - Plan: losartan (COZAAR) 25 MG tablet, CBC with Differential/Platelet, Comprehensive Metabolic Panel (CMET), TSH, Lipid Panel w/o Chol/HDL Ratio, Ambulatory referral to Neurology  Fatigue, unspecified type - Plan: TSH, Lipid Panel w/o Chol/HDL Ratio, Ambulatory referral to Neurology  Cough  Sleep apnea, unspecified type -  Plan: Ambulatory referral to Neurology  Bronchitis    Meds ordered this encounter  Medications  . losartan (COZAAR) 25 MG tablet    Sig: Take 1.5 tablets (37.5 mg total) by mouth daily.    Dispense:  90 tablet    Refill:  1  . hydrochlorothiazide (HYDRODIURIL) 25 MG tablet    Sig: Take 1 tablet (25 mg total) by mouth daily.    Dispense:  90 tablet    Refill:  3  . amoxicillin-clavulanate (AUGMENTIN) 875-125 MG tablet    Sig: Take 1 tablet by mouth 2 (two) times daily.    Dispense:  20  tablet    Refill:  0  . predniSONE (STERAPRED UNI-PAK 21 TAB) 10 MG (21) TBPK tablet    Sig: PO: Take 6 tablets on day 1:Take 5 tablets day 2:Take 4 tablets day 3: Take 3 tablets day 4:Take 2 tablets day five: 5 Take 1 tablet day 6    Dispense:  21 tablet    Refill:  0  . levocetirizine (XYZAL) 5 MG tablet    Sig: Take 1 tablet (5 mg total) by mouth every evening.    Dispense:  90 tablet    Refill:  1    Return in about 3 months (around 10/05/2020), or if symptoms worsen or fail to improve, for at any time for any worsening symptoms, Go to Emergency room/ urgent care if worse.      Red Flags discussed. The patient was given clear instructions to go to ER or return to medical center if any red flags develop, symptoms do not improve, worsen or new problems develop. They verbalized understanding.   The entirety of the information documented in the History of Present Illness, Review of Systems and Physical Exam were personally obtained by me. Portions of this information were initially documented by the CMA and reviewed by me for thoroughness and accuracy.      Marcille Buffy, [place] [phone] (phone) [phone] (fax)  [place]

## 2020-07-07 ENCOUNTER — Ambulatory Visit: Payer: Self-pay | Admitting: *Deleted

## 2020-07-07 LAB — CBC WITH DIFFERENTIAL/PLATELET
Basophils Absolute: 0 10*3/uL (ref 0.0–0.2)
Basos: 1 %
EOS (ABSOLUTE): 0.1 10*3/uL (ref 0.0–0.4)
Eos: 1 %
Hematocrit: 39.2 % (ref 37.5–51.0)
Hemoglobin: 13 g/dL (ref 13.0–17.7)
Immature Grans (Abs): 0 10*3/uL (ref 0.0–0.1)
Immature Granulocytes: 0 %
Lymphocytes Absolute: 1.7 10*3/uL (ref 0.7–3.1)
Lymphs: 21 %
MCH: 28 pg (ref 26.6–33.0)
MCHC: 33.2 g/dL (ref 31.5–35.7)
MCV: 84 fL (ref 79–97)
Monocytes Absolute: 0.7 10*3/uL (ref 0.1–0.9)
Monocytes: 9 %
Neutrophils Absolute: 5.3 10*3/uL (ref 1.4–7.0)
Neutrophils: 68 %
Platelets: 292 10*3/uL (ref 150–450)
RBC: 4.65 x10E6/uL (ref 4.14–5.80)
RDW: 13.6 % (ref 11.6–15.4)
WBC: 7.9 10*3/uL (ref 3.4–10.8)

## 2020-07-07 LAB — COMPREHENSIVE METABOLIC PANEL
ALT: 32 IU/L (ref 0–44)
AST: 27 IU/L (ref 0–40)
Albumin/Globulin Ratio: 1.5 (ref 1.2–2.2)
Albumin: 4 g/dL (ref 4.0–5.0)
Alkaline Phosphatase: 75 IU/L (ref 44–121)
BUN/Creatinine Ratio: 11 (ref 9–20)
BUN: 8 mg/dL (ref 6–24)
Bilirubin Total: 0.4 mg/dL (ref 0.0–1.2)
CO2: 26 mmol/L (ref 20–29)
Calcium: 9 mg/dL (ref 8.7–10.2)
Chloride: 100 mmol/L (ref 96–106)
Creatinine, Ser: 0.74 mg/dL — ABNORMAL LOW (ref 0.76–1.27)
Globulin, Total: 2.7 g/dL (ref 1.5–4.5)
Glucose: 98 mg/dL (ref 65–99)
Potassium: 4.3 mmol/L (ref 3.5–5.2)
Sodium: 140 mmol/L (ref 134–144)
Total Protein: 6.7 g/dL (ref 6.0–8.5)
eGFR: 115 mL/min/{1.73_m2} (ref >59)

## 2020-07-07 LAB — LIPID PANEL W/O CHOL/HDL RATIO
Cholesterol, Total: 160 mg/dL (ref 100–199)
HDL: 30 mg/dL — ABNORMAL LOW (ref >39)
LDL Chol Calc (NIH): 94 mg/dL (ref 0–99)
Triglycerides: 209 mg/dL — ABNORMAL HIGH (ref 0–149)
VLDL Cholesterol Cal: 36 mg/dL (ref 5–40)

## 2020-07-07 LAB — TSH: TSH: 3.07 u[IU]/mL (ref 0.450–4.500)

## 2020-07-07 NOTE — Telephone Encounter (Signed)
C/o elevated B/P today . Patient reports he took his B/P today at work via automatic B/P monitor on wrist and results were 163/102 and 165/106. Denies dizziness or blurred vision, chest pain , difficulty breathing, headache, weakness on either side. C/o feeling tired. Patient has not missed any doses of B/P meds. B/P checked while on call for 168/87 HR 76 and 5 minutes later for 151/88 HR 81. Patient requesting if he can keep 10/08/20 appt or if he needs to be seen sooner. Care advise given. Patient verbalized understanding of care advise and to call back or go to [facility] or ED if symptoms worsen.   Reason for Disposition . [1] Systolic BP  >= 130 OR Diastolic >= 80 AND [2] taking BP medications  Answer Assessment - Initial Assessment Questions 1. BLOOD PRESSURE: "What is the blood pressure?" "Did you take at least two measurements 5 minutes apart?"     B/P 168/87 HR 76.  After 5 minutes B/P 151/ 88 HR 81 2. ONSET: "When did you take your blood pressure?"     Now and just a few minutes ago  3. HOW: "How did you obtain the blood pressure?" (e.g., visiting nurse, automatic home BP monitor)      Automatic B/P monitor to check on the wrist  4. HISTORY: "Do you have a history of high blood pressure?"     Yes  5. MEDICATIONS: "Are you taking any medications for blood pressure?" "Have you missed any doses recently?"     Yes . Have not missed any doses 6. OTHER SYMPTOMS: "Do you have any symptoms?" (e.g., headache, chest pain, blurred vision, difficulty breathing, weakness)     Feeling tired  7. PREGNANCY: "Is there any chance you are pregnant?" "When was your last menstrual period?"     na  Protocols used: BLOOD PRESSURE - HIGH-A-AH

## 2020-07-07 NOTE — Progress Notes (Signed)
CBC, CMP and TSH for thyroid within normal limits.  HDL good cholesterol still on low side the higher the better for this and triglycerides elevated mildly improved, can improve both with increased exercise and dietary changes avoiding high starch and sugary foods.  Increase exercise, fiber, fruits, vegetables, lean meat, and omega 3/fish intake and decrease saturated fat.

## 2020-07-08 NOTE — Telephone Encounter (Signed)
He can take medication as discussed at last office visit, per dosage discussed HCTZ and Cozaar. See note.  Continue to monitor blood pressure. DASH diet advised.  Return to office sooner if persists or any other new symptoms. Keep follow up scheduled.  He had been off his medication some at office visit.   ER if after hours and any emergent symptoms at anytime.

## 2020-07-08 NOTE — Telephone Encounter (Signed)
Patient returned call- notified of PCP message- patient states he understands- his BP readings have remained stable: 141/83,133/79 today. Will continue as directed and call back with concerns/changes.

## 2020-07-08 NOTE — Telephone Encounter (Signed)
LMTCB. PEC may inform patient.

## 2020-07-13 ENCOUNTER — Encounter: Payer: Self-pay | Admitting: Adult Health

## 2020-08-22 DIAGNOSIS — R058 Other specified cough: Secondary | ICD-10-CM | POA: Diagnosis not present

## 2020-08-22 DIAGNOSIS — Z20822 Contact with and (suspected) exposure to covid-19: Secondary | ICD-10-CM | POA: Diagnosis not present

## 2020-08-22 DIAGNOSIS — R0981 Nasal congestion: Secondary | ICD-10-CM | POA: Diagnosis not present

## 2020-09-22 ENCOUNTER — Encounter: Payer: Self-pay | Admitting: Adult Health

## 2020-09-23 ENCOUNTER — Other Ambulatory Visit: Payer: Self-pay

## 2020-09-23 ENCOUNTER — Telehealth: Payer: Self-pay | Admitting: Adult Health

## 2020-09-23 ENCOUNTER — Institutional Professional Consult (permissible substitution): Payer: BC Managed Care – PPO | Admitting: Neurology

## 2020-09-23 DIAGNOSIS — I10 Essential (primary) hypertension: Secondary | ICD-10-CM

## 2020-09-23 MED ORDER — LOSARTAN POTASSIUM 25 MG PO TABS: 37.5000 mg | ORAL_TABLET | Freq: Every day | ORAL | 1 refills | Status: DC

## 2020-09-23 NOTE — Telephone Encounter (Signed)
[facility] faxed refill request for the following medications:   losartan (COZAAR) 25 MG tablet   Please advise.  

## 2020-10-08 ENCOUNTER — Ambulatory Visit: Payer: BC Managed Care – PPO | Admitting: Adult Health

## 2020-10-21 DIAGNOSIS — M9905 Segmental and somatic dysfunction of pelvic region: Secondary | ICD-10-CM | POA: Diagnosis not present

## 2020-10-21 DIAGNOSIS — M5136 Other intervertebral disc degeneration, lumbar region: Secondary | ICD-10-CM | POA: Diagnosis not present

## 2020-10-21 DIAGNOSIS — M5416 Radiculopathy, lumbar region: Secondary | ICD-10-CM | POA: Diagnosis not present

## 2020-10-21 DIAGNOSIS — M9903 Segmental and somatic dysfunction of lumbar region: Secondary | ICD-10-CM | POA: Diagnosis not present

## 2020-10-27 ENCOUNTER — Institutional Professional Consult (permissible substitution): Payer: BC Managed Care – PPO | Admitting: Neurology

## 2020-10-29 DIAGNOSIS — M5136 Other intervertebral disc degeneration, lumbar region: Secondary | ICD-10-CM | POA: Diagnosis not present

## 2020-10-29 DIAGNOSIS — M9903 Segmental and somatic dysfunction of lumbar region: Secondary | ICD-10-CM | POA: Diagnosis not present

## 2020-10-29 DIAGNOSIS — M9905 Segmental and somatic dysfunction of pelvic region: Secondary | ICD-10-CM | POA: Diagnosis not present

## 2020-10-29 DIAGNOSIS — M5416 Radiculopathy, lumbar region: Secondary | ICD-10-CM | POA: Diagnosis not present

## 2020-11-02 DIAGNOSIS — M9905 Segmental and somatic dysfunction of pelvic region: Secondary | ICD-10-CM | POA: Diagnosis not present

## 2020-11-02 DIAGNOSIS — M5416 Radiculopathy, lumbar region: Secondary | ICD-10-CM | POA: Diagnosis not present

## 2020-11-02 DIAGNOSIS — M5136 Other intervertebral disc degeneration, lumbar region: Secondary | ICD-10-CM | POA: Diagnosis not present

## 2020-11-02 DIAGNOSIS — M9903 Segmental and somatic dysfunction of lumbar region: Secondary | ICD-10-CM | POA: Diagnosis not present

## 2020-11-05 DIAGNOSIS — M5136 Other intervertebral disc degeneration, lumbar region: Secondary | ICD-10-CM | POA: Diagnosis not present

## 2020-11-05 DIAGNOSIS — M5416 Radiculopathy, lumbar region: Secondary | ICD-10-CM | POA: Diagnosis not present

## 2020-11-05 DIAGNOSIS — M9905 Segmental and somatic dysfunction of pelvic region: Secondary | ICD-10-CM | POA: Diagnosis not present

## 2020-11-05 DIAGNOSIS — M9903 Segmental and somatic dysfunction of lumbar region: Secondary | ICD-10-CM | POA: Diagnosis not present

## 2020-11-06 DIAGNOSIS — M5416 Radiculopathy, lumbar region: Secondary | ICD-10-CM | POA: Diagnosis not present

## 2020-11-06 DIAGNOSIS — M9903 Segmental and somatic dysfunction of lumbar region: Secondary | ICD-10-CM | POA: Diagnosis not present

## 2020-11-06 DIAGNOSIS — M5136 Other intervertebral disc degeneration, lumbar region: Secondary | ICD-10-CM | POA: Diagnosis not present

## 2020-11-06 DIAGNOSIS — M9905 Segmental and somatic dysfunction of pelvic region: Secondary | ICD-10-CM | POA: Diagnosis not present

## 2020-11-10 DIAGNOSIS — M5416 Radiculopathy, lumbar region: Secondary | ICD-10-CM | POA: Diagnosis not present

## 2020-11-10 DIAGNOSIS — M9903 Segmental and somatic dysfunction of lumbar region: Secondary | ICD-10-CM | POA: Diagnosis not present

## 2020-11-10 DIAGNOSIS — M5136 Other intervertebral disc degeneration, lumbar region: Secondary | ICD-10-CM | POA: Diagnosis not present

## 2020-11-10 DIAGNOSIS — M9905 Segmental and somatic dysfunction of pelvic region: Secondary | ICD-10-CM | POA: Diagnosis not present

## 2020-11-11 DIAGNOSIS — M5136 Other intervertebral disc degeneration, lumbar region: Secondary | ICD-10-CM | POA: Diagnosis not present

## 2020-11-11 DIAGNOSIS — M9903 Segmental and somatic dysfunction of lumbar region: Secondary | ICD-10-CM | POA: Diagnosis not present

## 2020-11-11 DIAGNOSIS — M5416 Radiculopathy, lumbar region: Secondary | ICD-10-CM | POA: Diagnosis not present

## 2020-11-11 DIAGNOSIS — M9905 Segmental and somatic dysfunction of pelvic region: Secondary | ICD-10-CM | POA: Diagnosis not present

## 2020-11-13 DIAGNOSIS — M9905 Segmental and somatic dysfunction of pelvic region: Secondary | ICD-10-CM | POA: Diagnosis not present

## 2020-11-13 DIAGNOSIS — M9903 Segmental and somatic dysfunction of lumbar region: Secondary | ICD-10-CM | POA: Diagnosis not present

## 2020-11-13 DIAGNOSIS — M5136 Other intervertebral disc degeneration, lumbar region: Secondary | ICD-10-CM | POA: Diagnosis not present

## 2020-11-13 DIAGNOSIS — M5416 Radiculopathy, lumbar region: Secondary | ICD-10-CM | POA: Diagnosis not present

## 2020-11-17 DIAGNOSIS — M5136 Other intervertebral disc degeneration, lumbar region: Secondary | ICD-10-CM | POA: Diagnosis not present

## 2020-11-17 DIAGNOSIS — M9905 Segmental and somatic dysfunction of pelvic region: Secondary | ICD-10-CM | POA: Diagnosis not present

## 2020-11-17 DIAGNOSIS — M9903 Segmental and somatic dysfunction of lumbar region: Secondary | ICD-10-CM | POA: Diagnosis not present

## 2020-11-17 DIAGNOSIS — M5416 Radiculopathy, lumbar region: Secondary | ICD-10-CM | POA: Diagnosis not present

## 2020-11-19 DIAGNOSIS — M5136 Other intervertebral disc degeneration, lumbar region: Secondary | ICD-10-CM | POA: Diagnosis not present

## 2020-11-19 DIAGNOSIS — M9905 Segmental and somatic dysfunction of pelvic region: Secondary | ICD-10-CM | POA: Diagnosis not present

## 2020-11-19 DIAGNOSIS — M5416 Radiculopathy, lumbar region: Secondary | ICD-10-CM | POA: Diagnosis not present

## 2020-11-19 DIAGNOSIS — M9903 Segmental and somatic dysfunction of lumbar region: Secondary | ICD-10-CM | POA: Diagnosis not present

## 2020-11-20 DIAGNOSIS — M5136 Other intervertebral disc degeneration, lumbar region: Secondary | ICD-10-CM | POA: Diagnosis not present

## 2020-11-20 DIAGNOSIS — M9903 Segmental and somatic dysfunction of lumbar region: Secondary | ICD-10-CM | POA: Diagnosis not present

## 2020-11-20 DIAGNOSIS — M9905 Segmental and somatic dysfunction of pelvic region: Secondary | ICD-10-CM | POA: Diagnosis not present

## 2020-11-20 DIAGNOSIS — M5416 Radiculopathy, lumbar region: Secondary | ICD-10-CM | POA: Diagnosis not present

## 2020-12-02 DIAGNOSIS — M9903 Segmental and somatic dysfunction of lumbar region: Secondary | ICD-10-CM | POA: Diagnosis not present

## 2020-12-02 DIAGNOSIS — M5416 Radiculopathy, lumbar region: Secondary | ICD-10-CM | POA: Diagnosis not present

## 2020-12-02 DIAGNOSIS — M9905 Segmental and somatic dysfunction of pelvic region: Secondary | ICD-10-CM | POA: Diagnosis not present

## 2020-12-02 DIAGNOSIS — M5136 Other intervertebral disc degeneration, lumbar region: Secondary | ICD-10-CM | POA: Diagnosis not present

## 2020-12-03 DIAGNOSIS — M5416 Radiculopathy, lumbar region: Secondary | ICD-10-CM | POA: Diagnosis not present

## 2020-12-03 DIAGNOSIS — M9903 Segmental and somatic dysfunction of lumbar region: Secondary | ICD-10-CM | POA: Diagnosis not present

## 2020-12-03 DIAGNOSIS — M9905 Segmental and somatic dysfunction of pelvic region: Secondary | ICD-10-CM | POA: Diagnosis not present

## 2020-12-03 DIAGNOSIS — M5136 Other intervertebral disc degeneration, lumbar region: Secondary | ICD-10-CM | POA: Diagnosis not present

## 2020-12-08 DIAGNOSIS — M9905 Segmental and somatic dysfunction of pelvic region: Secondary | ICD-10-CM | POA: Diagnosis not present

## 2020-12-08 DIAGNOSIS — M5136 Other intervertebral disc degeneration, lumbar region: Secondary | ICD-10-CM | POA: Diagnosis not present

## 2020-12-08 DIAGNOSIS — M9903 Segmental and somatic dysfunction of lumbar region: Secondary | ICD-10-CM | POA: Diagnosis not present

## 2020-12-08 DIAGNOSIS — M5416 Radiculopathy, lumbar region: Secondary | ICD-10-CM | POA: Diagnosis not present

## 2020-12-10 DIAGNOSIS — M9903 Segmental and somatic dysfunction of lumbar region: Secondary | ICD-10-CM | POA: Diagnosis not present

## 2020-12-10 DIAGNOSIS — M5416 Radiculopathy, lumbar region: Secondary | ICD-10-CM | POA: Diagnosis not present

## 2020-12-10 DIAGNOSIS — M5136 Other intervertebral disc degeneration, lumbar region: Secondary | ICD-10-CM | POA: Diagnosis not present

## 2020-12-10 DIAGNOSIS — M9905 Segmental and somatic dysfunction of pelvic region: Secondary | ICD-10-CM | POA: Diagnosis not present

## 2020-12-15 DIAGNOSIS — M5416 Radiculopathy, lumbar region: Secondary | ICD-10-CM | POA: Diagnosis not present

## 2020-12-15 DIAGNOSIS — M9903 Segmental and somatic dysfunction of lumbar region: Secondary | ICD-10-CM | POA: Diagnosis not present

## 2020-12-15 DIAGNOSIS — M5136 Other intervertebral disc degeneration, lumbar region: Secondary | ICD-10-CM | POA: Diagnosis not present

## 2020-12-15 DIAGNOSIS — M9905 Segmental and somatic dysfunction of pelvic region: Secondary | ICD-10-CM | POA: Diagnosis not present

## 2020-12-17 DIAGNOSIS — M9905 Segmental and somatic dysfunction of pelvic region: Secondary | ICD-10-CM | POA: Diagnosis not present

## 2020-12-17 DIAGNOSIS — M5136 Other intervertebral disc degeneration, lumbar region: Secondary | ICD-10-CM | POA: Diagnosis not present

## 2020-12-17 DIAGNOSIS — M5416 Radiculopathy, lumbar region: Secondary | ICD-10-CM | POA: Diagnosis not present

## 2020-12-17 DIAGNOSIS — M9903 Segmental and somatic dysfunction of lumbar region: Secondary | ICD-10-CM | POA: Diagnosis not present

## 2020-12-22 DIAGNOSIS — M9905 Segmental and somatic dysfunction of pelvic region: Secondary | ICD-10-CM | POA: Diagnosis not present

## 2020-12-22 DIAGNOSIS — M9903 Segmental and somatic dysfunction of lumbar region: Secondary | ICD-10-CM | POA: Diagnosis not present

## 2020-12-22 DIAGNOSIS — M5416 Radiculopathy, lumbar region: Secondary | ICD-10-CM | POA: Diagnosis not present

## 2020-12-22 DIAGNOSIS — M5136 Other intervertebral disc degeneration, lumbar region: Secondary | ICD-10-CM | POA: Diagnosis not present

## 2020-12-24 DIAGNOSIS — M5416 Radiculopathy, lumbar region: Secondary | ICD-10-CM | POA: Diagnosis not present

## 2020-12-24 DIAGNOSIS — M9903 Segmental and somatic dysfunction of lumbar region: Secondary | ICD-10-CM | POA: Diagnosis not present

## 2020-12-24 DIAGNOSIS — M9905 Segmental and somatic dysfunction of pelvic region: Secondary | ICD-10-CM | POA: Diagnosis not present

## 2020-12-24 DIAGNOSIS — M5136 Other intervertebral disc degeneration, lumbar region: Secondary | ICD-10-CM | POA: Diagnosis not present

## 2020-12-29 DIAGNOSIS — M5136 Other intervertebral disc degeneration, lumbar region: Secondary | ICD-10-CM | POA: Diagnosis not present

## 2020-12-29 DIAGNOSIS — M5416 Radiculopathy, lumbar region: Secondary | ICD-10-CM | POA: Diagnosis not present

## 2020-12-29 DIAGNOSIS — M9905 Segmental and somatic dysfunction of pelvic region: Secondary | ICD-10-CM | POA: Diagnosis not present

## 2020-12-29 DIAGNOSIS — M9903 Segmental and somatic dysfunction of lumbar region: Secondary | ICD-10-CM | POA: Diagnosis not present

## 2020-12-31 DIAGNOSIS — M5416 Radiculopathy, lumbar region: Secondary | ICD-10-CM | POA: Diagnosis not present

## 2020-12-31 DIAGNOSIS — M9905 Segmental and somatic dysfunction of pelvic region: Secondary | ICD-10-CM | POA: Diagnosis not present

## 2020-12-31 DIAGNOSIS — M5136 Other intervertebral disc degeneration, lumbar region: Secondary | ICD-10-CM | POA: Diagnosis not present

## 2020-12-31 DIAGNOSIS — M9903 Segmental and somatic dysfunction of lumbar region: Secondary | ICD-10-CM | POA: Diagnosis not present

## 2021-01-04 ENCOUNTER — Other Ambulatory Visit: Payer: Self-pay | Admitting: Neurology

## 2021-01-04 ENCOUNTER — Encounter: Payer: Self-pay | Admitting: Neurology

## 2021-01-04 ENCOUNTER — Ambulatory Visit: Payer: BC Managed Care – PPO | Admitting: Neurology

## 2021-01-04 VITALS — BP 174/98 | HR 91 | Ht 70.0 in | Wt 374.5 lb

## 2021-01-04 DIAGNOSIS — G4733 Obstructive sleep apnea (adult) (pediatric): Secondary | ICD-10-CM

## 2021-01-04 DIAGNOSIS — G4719 Other hypersomnia: Secondary | ICD-10-CM | POA: Insufficient documentation

## 2021-01-04 DIAGNOSIS — F418 Other specified anxiety disorders: Secondary | ICD-10-CM | POA: Diagnosis not present

## 2021-01-04 DIAGNOSIS — G4701 Insomnia due to medical condition: Secondary | ICD-10-CM

## 2021-01-04 DIAGNOSIS — R4589 Other symptoms and signs involving emotional state: Secondary | ICD-10-CM | POA: Insufficient documentation

## 2021-01-04 DIAGNOSIS — Z6841 Body Mass Index (BMI) 40.0 and over, adult: Secondary | ICD-10-CM

## 2021-01-04 NOTE — Progress Notes (Signed)
SLEEP MED[facility]INE [facility]IC    Provider:  Melvyn Novas, MD  Primary Care Physician:  Berniece Pap, FNP [address] [place] [address]     Referring Provider: Berniece Pap, Fnp [address] [place],  [place] [address]          Chief Complaint according to patient   Patient presents with:     New Patient (Initial Visit)           HISTORY OF PRESENT ILLNESS:  Tyshon Fanning II is a 45 y.o. year old White or Caucasian male patient seen here as a referral on 01/04/2021 from FNP for a transfer of CPAP care- .  Chief concern according to patient :  Rm 11, alone. Internal referral for sleep apnea, fatigue and HTN. Patient was dx with OSA and started on CPAP 3 years ago through [employer] The company ever did any follow up appts. Pt currently struggles w his cpap, seems to not be able to catch his breath when wearing it. Pt reports not getting the sleep he needs. Nodding while at work. Feels like his CPAP no longer seems to be working as well. He bought it on [place]. Was never fitted for an interface.    I have the pleasure of seeing Tray Klayman II today, a right-handed White or Caucasian male with a known OSA sleep disorder. He  has a past medical history of Gall bladder inflammation, Hypertension, and Obstructive sleep apnea., Obesity, CPAP intolerance. He is suffering form depression and anxiety.    The patient had the first sleep study in the year 2019  with a result of OSA. [facility].    Sleep relevant medical history: Insomnia, anxiety, Nocturia once a night, adenoid but no Tonsillectomy, lower spine tension pain.   Family medical /sleep history: father on CPAP with OSA.    Social history:  Patient is working as an Film/video editor and lives in a household alone with his dog. Family status is single.   The patient currently works office hours  Tobacco use; quit 3 years ago snuff-.  ETOH use one drink a week,  Caffeine  intake in form of Coffee( 2 cups a day) Soda( /) Tea ( /) or energy drinks. Regular exercise- none .     Sleep habits are as follows: The patient's dinner time is between 7 PM. The patient goes to bed at 12 PM and continues to sleep for 3 hours, wakes from sciatica and often has shorter and shorter intervals, anxiety plays a role. No bathroom breaks, loud snoring. .   The preferred sleep position is supine, and right , with the support of 2-3 pillows.  Dreams are reportedly frequent/vivid.  6-7  AM is the usual rise time. The patient wakes up spontaneously at 4 AM - in pain- He reports not feeling refreshed or restored in AM, with symptoms such as dry mouth, and  stiffness , residual fatigue. Naps are taken infrequently, lasting from 15 to 20 minutes at work- but less refreshing than it used to be.    Review of Systems: Out of a complete 14 system review, the patient complains of only the following symptoms, and all other reviewed systems are negative.:  Fatigue, sleepiness , snoring, fragmented sleep, Insomnia, pain.    How likely are you to doze in the following situations: 0 = not likely, 1 = slight chance, 2 = moderate chance, 3 = high chance  Sitting and Reading? Watching Television? Sitting inactive in a public place (theater or meeting)? As a passenger in a car for an hour without a break? Lying down in the afternoon when circumstances permit? Sitting and talking to someone? Sitting quietly after lunch without alcohol? In a car, while stopped for a few minutes in traffic?   Total = 17/ 24 points   FSS endorsed at 57/ 63 points.   Social History   Socioeconomic History   Marital status: Single    Spouse name: Not on file   Number of children: Not on file   Years of education: Not on file   Highest education level: Some college, no degree  Occupational History   Not on file  Tobacco Use   Smoking status: Former    Packs/day: 0.25    Years: 0.50    Pack years: 0.13     Types: Cigarettes    Quit date: 07/29/1999    Years since quitting: 21.4   Smokeless tobacco: Former    Types: Snuff    Quit date: 08/2018  Vaping Use   Vaping Use: Never used  Substance and Sexual Activity   Alcohol use: Yes    Alcohol/week: 2.0 - 3.0 standard drinks    Types: 2 - 3 Cans of beer per week   Drug use: No   Sexual activity: Not Currently  Other Topics Concern   Not on file  Social History Narrative   Lives alone   Left handed   Caffeine: 2 cups of coffee a day   Social Determinants of Health   Financial Resource Strain: Not on file  Food Insecurity: Not on file  Transportation Needs: Not on file  Physical Activity: Not on file  Stress: Not on file  Social Connections: Not on file    Family History  Problem Relation Age of Onset   Diabetes Mother    Heart attack Father 78   Heart attack Maternal Grandfather    Diabetes Maternal Grandfather     Past Medical History:  Diagnosis Date   Gall bladder inflammation    Hypertension    Obstructive sleep apnea     Past Surgical History:  Procedure Laterality Date   ADENOIDECTOMY       Current Outpatient Medications on File Prior to Visit  Medication Sig Dispense Refill   aspirin EC 81 MG tablet Take 81 mg by mouth daily. Swallow whole.     hydrochlorothiazide (HYDRODIURIL) 25 MG tablet Take 1 tablet (25 mg total) by mouth daily. 90 tablet 3   losartan (COZAAR) 25 MG tablet Take 1.5 tablets (37.5 mg total) by mouth daily. 90 tablet 1   UNABLE TO FIND CBD oil prn     No current facility-administered medications on file prior to visit.    No Known Allergies  Physical exam:  Today's Vitals   01/04/21 0824  BP: (!) 174/98  Pulse: 91  Weight: (!) 374 lb 8 oz (169.9 kg)  Height: 5\' 10"  (1.778 m)   Body mass index is 53.74 kg/m.   Wt Readings from Last 3 Encounters:  01/04/21 (!) 374 lb 8 oz (169.9 kg)  07/06/20 (!) 387 lb (175.5 kg)  03/16/20 (!) 385 lb 6.4 oz (174.8 kg)     Ht Readings from  Last 3 Encounters:  01/04/21 5\' 10"  (1.778 m)  03/16/20 5\' 10"  (1.778 m)  01/02/20 5\' 10"  (1.778 m)      General: The patient is awake, alert and appears not in acute  distress. The patient is well groomed. Head: Normocephalic, atraumatic. Neck is supple. Mallampati 3 plus - very narrow- ,  neck circumference:22 inches . Nasal airflow is patent.  Retrognathia is  seen-small oral airway. .  Dental status:  Cardiovascular:  Regular rate and cardiac rhythm by pulse,  without distended neck veins. Respiratory: Lungs are clear to auscultation.  Skin:  Without evidence of ankle edema, or rash. Trunk: The patient's posture is erect.   Neurologic exam : The patient is awake and alert, oriented to place and time.   Memory subjective described as intact.  Attention span & concentration ability appears normal.  Speech is fluent,  without  dysarthria, dysphonia or aphasia.  Mood and affect are appropriate.   Cranial nerves: no loss of smell or taste reported  Pupils are equal and briskly reactive to light. Funduscopic exam deferred.  Extraocular movements in vertical and horizontal planes were intact and without nystagmus. No Diplopia. Visual fields by finger perimetry are intact. Hearing was intact to soft voice and finger rubbing.    Facial sensation intact to fine touch.  Facial motor strength is symmetric and tongue and uvula move midline.  Neck ROM : rotation, tilt and flexion extension were normal for age and shoulder shrug was symmetrical.    Motor exam:  Symmetric bulk, tone and ROM.   Normal tone without cog wheeling, symmetric grip strength .   Sensory:  Fine touch, pinprick and vibration were tested  and  normal.  Proprioception tested in the upper extremities was normal.   Coordination: Rapid alternating movements in the fingers/hands were of normal speed.  The Finger-to-nose maneuver was intact without evidence of ataxia, dysmetria or tremor.   Gait and station: Patient could  rise unassisted from a seated position, walked without assistive device.  Stance is of wider base and the patient turned with 3-4 steps.  Toe and heel walk were deferred.  Deep tendon reflexes: in the  upper and lower extremities are symmetric and intact.  Babinski response was deferred.      After spending a total time of  45  minutes face to face and additional time for physical and neurologic examination, review of laboratory studies,  personal review of imaging studies, reports and results of other testing and review of referral information / records as far as provided in visit, I have established the following assessments:  1) the patient suffers form EDS related to poor sleep quality and duration. OSA was present.   2) OSA severe, he is not in doubt that he has apnea, OSA - and that CPAP is needed to overcome the AHI, but he is highly uncomfortable with a FFM and feels he has too much pressure in his current settings.   3) anxiety- worsened by feeling of suffocation.    My Plan is to proceed with:  1) attended sleep study to titrate to CPAP versus BiPAP-  and trying different masks. Consider F30 I.  2) Rv ASA we can reset his current CPAP / or starting BiPAP.  3) insomnia. Try melatonin 5 mg or less. If not enough will start TRAZODONE.   I would like to thank [place], Eula Fried, FNP and Berniece Pap, Fnp [address]  [place] [address] for allowing me to meet with and to take care of this pleasant patient.   In short, Fernado [employer] II is presenting with anxiety, CPAP intolerance and severe OSA, currently untreated.  I plan to follow up either  personally or through our NP within 2-4 month.   CC: I will share my notes with PCP.  Electronically signed by: Melvyn Novas, MD 01/04/2021 9:24 AM  [facility] Board certified by The [employer] of Sleep Medicine and Diplomate of the [employer] of Sleep  Medicine. Board certified In Neurology through the ABPN, Fellow of the [employer] of Neurology. Medical Director of [facility].

## 2021-01-04 NOTE — Patient Instructions (Addendum)
Melatonin Capsules or Tablets What is this medication? MELATONIN (mel uh TOH nin) is promoted for sleep disorders, such as insomnia or jet lag. Melatonin helps regulate your sleep cycle. This supplement is not intended to diagnose, treat, cure, or prevent any disease. This medicine may be used for other purposes; ask your health care provider or pharmacist if you have questions. COMMON BRAND NAME(S): Melatonex What should I tell my care team before I take this medication? They need to know if you have any of these conditions: Cancer Depression or mental illness Diabetes Hormone problems If you often drink alcohol Immune system problems Liver disease Lung or breathing disease, like asthma Organ transplant Seizure disorder An unusual or allergic reaction to melatonin, other medications, foods, dyes, or preservatives Pregnant or trying to get pregnant Breast-feeding How should I use this medication? Take this medication by mouth with a glass of water. Do not take with food. This medication is usually taken 1 or 2 hours before bedtime. After taking this medication, limit your activities to those needed to prepare for bed. Some products may be chewed or dissolved in the mouth before swallowing. Some tablets or capsules must be swallowed whole; do not cut, crush or chew. Follow the directions on the package labeling, or take as directed by your care team. Do not take this medication more often than directed. Talk to your care team the use of this medication in children. Special care may be needed. This medication is not recommended for use in children without a prescription. Overdosage: If you think you have taken too much of this medicine contact a poison control center or emergency room at once. NOTE: This medicine is only for you. Do not share this medicine with others. What if I miss a dose? If you miss taking your dose at the usual time, skip that dose. If it is almost time for your next  dose, take only that dose. Do not take double or extra doses. What may interact with this medication? Do not take this medication with any of the following: Fluvoxamine Ramelteon Tasimelteon This medication may also interact with the following: Alcohol Caffeine Carbamazepine Certain antibiotics like ciprofloxacin Certain medications for depression, anxiety, or psychotic disturbances Cimetidine Male hormones, like estrogens and birth control pills, patches, rings, or injections Methoxsalen Nifedipine Other herbal or dietary supplements Other medications for sleep Phenobarbital Rifampin Smoking tobacco Tamoxifen Warfarin This list may not describe all possible interactions. Give your health care provider a list of all the medicines, herbs, non-prescription drugs, or dietary supplements you use. Also tell them if you smoke, drink alcohol, or use illegal drugs. Some items may interact with your medicine. What should I watch for while using this medication? See your care team if your symptoms do not get better or if they get worse. Do not take this medication for more than 2 weeks unless your care team tells you to. You may get drowsy or dizzy. Do not drive, use machinery, or do anything that needs mental alertness until you know how this medication affects you. Do not stand or sit up quickly, especially if you are an older patient. This reduces the risk of dizzy or fainting spells. Alcohol may interfere with the effect of this medication. Avoid alcoholic drinks. After taking this medication, you may get up out of bed and do an activity that you do not know you are doing. The next morning, you may have no memory of this. Activities include driving a car ("sleep-driving"), making and  eating food, talking on the phone, sexual activity, and sleep-walking. Serious injuries have occurred. Call your care team right away if you find out you have done any of these activities. Do not take this  medication if you have used alcohol that evening. Do not take it if you have taken another medication for sleep. The risk of doing these sleep-related activities is higher. Talk to your care team before you use this medication if you are currently being treated for an emotional, mental, or sleep problem. This medication may interfere with your treatment. Herbal or dietary supplements are not regulated like medications. Rigid quality control standards are not required for dietary supplements. The purity and strength of these products can vary. The safety and effect of this dietary supplement for a certain disease or illness is not well known. This product is not intended to diagnose, treat, cure or prevent any disease. The Food and Drug Administration suggests the following to help consumers protect themselves: Always read product labels and follow directions. Natural does not mean a product is safe for humans to take. Look for products that include USP after the ingredient name. This means that the manufacturer followed the standards of the [place] Pharmacopoeia. Supplements made or sold by a nationally known food or drug company are more likely to be made under tight controls. You can write to the company for more information about how the product was made. What side effects may I notice from receiving this medication? Side effects that you should report to your care team as soon as possible: Allergic reactions-skin rash, itching, hives, swelling of the face, lips, tongue, or throat Mood and behavior changes-anxiety, nervousness, confusion, hallucinations, irritability, hostility, thoughts of suicide or self-harm, worsening mood, feelings of depression Side effects that usually do not require medical attention (report to your care team if they continue or are bothersome): Bedwetting in children Dizziness Drowsiness the day after use Headache Nausea This list may not describe all possible side effects.  Call your doctor for medical advice about side effects. You may report side effects to FDA at 1-800-FDA-1088. Where should I keep my medication? Keep out of the reach of children. Store at room temperature or as directed on the package label. Protect from moisture. Throw away any unused medication after the expiration date. NOTE: This sheet is a summary. It may not cover all possible information. If you have questions about this medicine, talk to your doctor, pharmacist, or health care provider.  2022 Elsevier/Gold Standard (2020-06-25 16:37:26) Obesity, Adult Obesity is having too much body fat. Being obese means that your weight is more than what is healthy for you. BMI is a number that explains how much body fat you have. If you have a BMI of 30 or more, you are obese. Obesity is often caused by eating or drinking more calories than your body uses. Changing your lifestyle can help you lose weight. Obesity can cause serious health problems, such as: Stroke. Coronary artery disease (CAD). Type 2 diabetes. Some types of cancer, including cancers of the colon, breast, uterus, and gallbladder. Osteoarthritis. High blood pressure (hypertension). High cholesterol. Sleep apnea. Gallbladder stones. Infertility problems. What are the causes? Eating meals each day that are high in calories, sugar, and fat. Being born with genes that may make you more likely to become obese. Having a medical condition that causes obesity. Taking certain medicines. Sitting a lot (having a sedentary lifestyle). Not getting enough sleep. Drinking a lot of drinks that have sugar in  them. What increases the risk? Having a family history of obesity. Being an [place] American woman. Being a Hispanic man. Living in an area with limited access to: [place], recreation centers, or sidewalks. Healthy food choices, such as grocery stores and farmers' markets. What are the signs or symptoms? The main sign is having too  much body fat. How is this treated? Treatment for this condition often includes changing your lifestyle. Treatment may include: Changing your diet. This may include making a healthy meal plan. Exercise. This may include activity that causes your heart to beat faster (aerobic exercise) and strength training. Work with your doctor to design a program that works for you. Medicine to help you lose weight. This may be used if you are not able to lose 1 pound a week after 6 weeks of healthy eating and more exercise. Treating conditions that cause the obesity. Surgery. Options may include gastric banding and gastric bypass. This may be done if: Other treatments have not helped to improve your condition. You have a BMI of 40 or higher. You have life-threatening health problems related to obesity. Follow these instructions at home: Eating and drinking  Follow advice from your doctor about what to eat and drink. Your doctor may tell you to: Limit fast food, sweets, and processed snack foods. Choose low-fat options. For example, choose low-fat milk instead of whole milk. Eat 5 or more servings of fruits or vegetables each day. Eat at home more often. This gives you more control over what you eat. Choose healthy foods when you eat out. Learn to read food labels. This will help you learn how much food is in 1 serving. Keep low-fat snacks available. Avoid drinks that have a lot of sugar in them. These include soda, fruit juice, iced tea with sugar, and flavored milk. Drink enough water to keep your pee (urine) pale yellow. Do not go on fad diets. Physical activity Exercise often, as told by your doctor. Most adults should get up to 150 minutes of moderate-intensity exercise every week.Ask your doctor: What types of exercise are safe for you. How often you should exercise. Warm up and stretch before being active. Do slow stretching after being active (cool down). Rest between times of being  active. Lifestyle Work with your doctor and a food expert (dietitian) to set a weight-loss goal that is best for you. Limit your screen time. Find ways to reward yourself that do not involve food. Do not drink alcohol if: Your doctor tells you not to drink. You are pregnant, may be pregnant, or are planning to become pregnant. If you drink alcohol: Limit how much you use to: 0-1 drink a day for women. 0-2 drinks a day for men. Be aware of how much alcohol is in your drink. In the [place] one drink equals one 12 oz bottle of beer (355 mL), one 5 oz glass of wine (148 mL), or one 1 oz glass of hard liquor (44 mL). General instructions Keep a weight-loss journal. This can help you keep track of: The food that you eat. How much exercise you get. Take over-the-counter and prescription medicines only as told by your doctor. Take vitamins and supplements only as told by your doctor. Think about joining a support group. Keep all follow-up visits as told by your doctor. This is important. Contact a doctor if: You cannot meet your weight loss goal after you have changed your diet and lifestyle for 6 weeks. Get help right away if you: Are having  trouble breathing. Are having thoughts of harming yourself. Summary Obesity is having too much body fat. Being obese means that your weight is more than what is healthy for you. Work with your doctor to set a weight-loss goal. Get regular exercise as told by your doctor. This information is not intended to replace advice given to you by your health care provider. Make sure you discuss any questions you have with your health care provider. Document Revised: 11/16/2017 Document Reviewed: 11/16/2017 Elsevier Patient Education  2022 Elsevier Inc. Sleep Apnea Sleep apnea affects breathing during sleep. It causes breathing to stop for 10 seconds or more, or to become shallow. People with sleep apnea usually snore loudly. It can also increase the risk  of: Heart attack. Stroke. Being very overweight (obese). Diabetes. Heart failure. Irregular heartbeat. High blood pressure. The goal of treatment is to help you breathe normally again. What are the causes? The most common cause of this condition is a collapsed or blocked airway. There are three kinds of sleep apnea: Obstructive sleep apnea. This is caused by a blocked or collapsed airway. Central sleep apnea. This happens when the brain does not send the right signals to the muscles that control breathing. Mixed sleep apnea. This is a combination of obstructive and central sleep apnea. What increases the risk? Being overweight. Smoking. Having a small airway. Being older. Being male. Drinking alcohol. Taking medicines to calm yourself (sedatives or tranquilizers). Having family members with the condition. Having a tongue or tonsils that are larger than normal. What are the signs or symptoms? Trouble staying asleep. Loud snoring. Headaches in the morning. Waking up gasping. Dry mouth or sore throat in the morning. Being sleepy or tired during the day. If you are sleepy or tired during the day, you may also: Not be able to focus your mind (concentrate). Forget things. Get angry a lot and have mood swings. Feel sad (depressed). Have changes in your personality. Have less interest in sex, if you are male. Be unable to have an erection, if you are male. How is this treated?  Sleeping on your side. Using a medicine to get rid of mucus in your nose (decongestant). Avoiding the use of alcohol, medicines to help you relax, or certain pain medicines (narcotics). Losing weight, if needed. Changing your diet. Quitting smoking. Using a machine to open your airway while you sleep, such as: An oral appliance. This is a mouthpiece that shifts your lower jaw forward. A CPAP device. This device blows air through a mask when you breathe out (exhale). An EPAP device. This has valves  that you put in each nostril. A BPAP device. This device blows air through a mask when you breathe in (inhale) and breathe out. Having surgery if other treatments do not work. Follow these instructions at home: Lifestyle Make changes that your doctor recommends. Eat a healthy diet. Lose weight if needed. Avoid alcohol, medicines to help you relax, and some pain medicines. Do not smoke or use any products that contain nicotine or tobacco. If you need help quitting, ask your doctor. General instructions Take over-the-counter and prescription medicines only as told by your doctor. If you were given a machine to use while you sleep, use it only as told by your doctor. If you are having surgery, make sure to tell your doctor you have sleep apnea. You may need to bring your device with you. Keep all follow-up visits. Contact a doctor if: The machine that you were given to use during  sleep bothers you or does not seem to be working. You do not get better. You get worse. Get help right away if: Your chest hurts. You have trouble breathing in enough air. You have an uncomfortable feeling in your back, arms, or stomach. You have trouble talking. One side of your body feels weak. A part of your face is hanging down. These symptoms may be an emergency. Get help right away. Call your local emergency services (911 in the [place] Do not wait to see if the symptoms will go away. Do not drive yourself to the hospital. Summary This condition affects breathing during sleep. The most common cause is a collapsed or blocked airway. The goal of treatment is to help you breathe normally while you sleep. This information is not intended to replace advice given to you by your health care provider. Make sure you discuss any questions you have with your health care provider. Document Revised: 02/21/2020 Document Reviewed: 02/21/2020 Elsevier Patient Education  2022 Elsevier Inc. Quality Sleep Information,  Adult Quality sleep is important for your mental and physical health. It also improves your quality of life. Quality sleep means you: Are asleep for most of the time you are in bed. Fall asleep within 30 minutes. Wake up no more than once a night.  Are awake for no longer than 20 minutes if you do wake up during the night. Most adults need 7-8 hours of quality sleep each night. How can poor sleep affect me? If you do not get enough quality sleep, you may have: Mood swings. Daytime sleepiness. Confusion. Decreased reaction time. Sleep disorders, such as insomnia and sleep apnea. Difficulty with: Solving problems. Coping with stress. Paying attention. These issues may affect your performance and productivity at work, school, and at home. Lack of sleep may also put you at higher risk for accidents, suicide, and risky behaviors. If you do not get quality sleep you may also be at higher risk for several health problems, including: Infections. Type 2 diabetes. Heart disease. High blood pressure. Obesity. Worsening of long-term conditions, like arthritis, kidney disease, depression, Parkinson's disease, and epilepsy. What actions can I take to get more quality sleep?   Stick to a sleep schedule. Go to sleep and wake up at about the same time each day. Do not try to sleep less on weekdays and make up for lost sleep on weekends. This does not work. Try to get about 30 minutes of exercise on most days. Do not exercise 2-3 hours before going to bed. Limit naps during the day to 30 minutes or less. Do not use any products that contain nicotine or tobacco, such as cigarettes or e-cigarettes. If you need help quitting, ask your health care provider. Do not drink caffeinated beverages for at least 8 hours before going to bed. Coffee, tea, and some sodas contain caffeine. Do not drink alcohol close to bedtime. Do not eat large meals close to bedtime. Do not take naps in the late afternoon. Try to  get at least 30 minutes of sunlight every day. Morning sunlight is best. Make time to relax before bed. Reading, listening to music, or taking a hot bath promotes quality sleep. Make your bedroom a place that promotes quality sleep. Keep your bedroom dark, quiet, and at a comfortable room temperature. Make sure your bed is comfortable. Take out sleep distractions like TV, a computer, smartphone, and bright lights. If you are lying awake in bed for longer than 20 minutes, get up and do  a relaxing activity until you feel sleepy. Work with your health care provider to treat medical conditions that may affect sleeping, such as: Nasal obstruction. Snoring. Sleep apnea and other sleep disorders. Talk to your health care provider if you think any of your prescription medicines may cause you to have difficulty falling or staying asleep. If you have sleep problems, talk with a sleep consultant. If you think you have a sleep disorder, talk with your health care provider about getting evaluated by a specialist. Where to find more information National Sleep Foundation website: https://sleepfoundation.org National Heart, Lung, and Blood Institute (NHLBI): https://hall.info/.pdf Centers for Disease Control and Prevention (CDC): DetailSports.is Contact a health care provider if you: Have trouble getting to sleep or staying asleep. Often wake up very early in the morning and cannot get back to sleep. Have daytime sleepiness. Have daytime sleep attacks of suddenly falling asleep and sudden muscle weakness (narcolepsy). Have a tingling sensation in your legs with a strong urge to move your legs (restless legs syndrome). Stop breathing briefly during sleep (sleep apnea). Think you have a sleep disorder or are taking a medicine that is affecting your quality of sleep. Summary Most adults need 7-8 hours of quality sleep each night. Getting enough quality sleep is  an important part of health and well-being. Make your bedroom a place that promotes quality sleep and avoid things that may cause you to have poor sleep, such as alcohol, caffeine, smoking, and large meals. Talk to your health care provider if you have trouble falling asleep or staying asleep. This information is not intended to replace advice given to you by your health care provider. Make sure you discuss any questions you have with your health care provider. Document Revised: 06/21/2017 Document Reviewed: 06/21/2017 Elsevier Patient Education  2022 [employer].

## 2021-01-08 ENCOUNTER — Other Ambulatory Visit: Payer: Self-pay | Admitting: Adult Health

## 2021-01-15 DIAGNOSIS — G4733 Obstructive sleep apnea (adult) (pediatric): Secondary | ICD-10-CM | POA: Diagnosis not present

## 2021-01-20 ENCOUNTER — Telehealth: Payer: Self-pay

## 2021-01-20 NOTE — Telephone Encounter (Signed)
LVM for pt to call me back to schedule sleep study  

## 2021-01-25 ENCOUNTER — Telehealth: Payer: Self-pay

## 2021-01-25 NOTE — Telephone Encounter (Signed)
LVM for pt to call me back to schedule sleep study  

## 2021-02-01 ENCOUNTER — Telehealth: Payer: Self-pay

## 2021-02-01 NOTE — Telephone Encounter (Signed)
We have attempted to call the patient 2 times to schedule sleep study. Patient has been unavailable at the phone numbers we have on file and has not returned our calls. If patient calls back we will schedule them for their sleep study. ° °

## 2021-02-03 ENCOUNTER — Other Ambulatory Visit: Payer: Self-pay | Admitting: Family Medicine

## 2021-02-03 NOTE — Telephone Encounter (Signed)
Pt called, informed pt that he is due for an OV in order to receive medication refills. Pt states he is not taking this medication anymore and is no longer pt with [facility] d/t Marcelino Duster, NP left and he followed her to new practice. Advised pt that will make note of this and refill request will be refused.   Requested Prescriptions  Pending Prescriptions Disp Refills   levocetirizine (XYZAL) 5 MG tablet [Pharmacy Med Name: LEVOCETIRIZINE 5 MG TABLET] 30 tablet 0    Sig: TAKE 1 TABLET BY MOUTH EVERY EVENING. PLEASE SCHEDULE AN OFFICE VISIT BEFORE ANYMORE REFILLS.     Ear, Nose, and Throat:  Antihistamines Passed - 02/03/2021 10:34 AM      Passed - Valid encounter within last 12 months    Recent Outpatient Visits           7 months ago Morbid obesity with BMI of 50.0-59.9, adult [facility])   [facility] Flinchum, Eula Fried, FNP   10 months ago Vitamin D insufficiency   [facility] Flinchum, Eula Fried, FNP   1 year ago Morbid obesity with BMI of 50.0-59.9, adult [facility])   [facility] Flinchum, Eula Fried, FNP   1 year ago Hypertension, unspecified type   [employer], Eula Fried, FNP   1 year ago High triglycerides   [facility] Flinchum, Eula Fried, FNP

## 2021-03-19 ENCOUNTER — Other Ambulatory Visit: Payer: Self-pay | Admitting: Family Medicine

## 2021-03-19 DIAGNOSIS — I10 Essential (primary) hypertension: Secondary | ICD-10-CM

## 2021-03-19 NOTE — Telephone Encounter (Signed)
Provider is no longer at practice, pt needs to contact provider at new location.   Requested Prescriptions  Refused Prescriptions Disp Refills   losartan (COZAAR) 25 MG tablet [Pharmacy Med Name: LOSARTAN POTASSIUM 25 MG TAB] 135 tablet 1    Sig: TAKE 1 AND 1/2 TABLETS BY MOUTH DAILY     Cardiovascular:  Angiotensin Receptor Blockers Failed - 03/19/2021 10:28 AM      Failed - Cr in normal range and within 180 days    Creatinine, Ser  Date Value Ref Range Status  07/06/2020 0.74 (L) 0.76 - 1.27 mg/dL Final         Failed - K in normal range and within 180 days    Potassium  Date Value Ref Range Status  07/06/2020 4.3 3.5 - 5.2 mmol/L Final         Failed - Last BP in normal range    BP Readings from Last 1 Encounters:  01/04/21 (!) 174/98         Failed - Valid encounter within last 6 months    Recent Outpatient Visits          8 months ago Morbid obesity with BMI of 50.0-59.9, adult (HCC)   [facility] Flinchum, Eula Fried, FNP   1 year ago Vitamin D insufficiency   [facility] Flinchum, Eula Fried, FNP   1 year ago Morbid obesity with BMI of 50.0-59.9, adult [facility])   [facility] Flinchum, Eula Fried, FNP   1 year ago Hypertension, unspecified type   [employer], Eula Fried, FNP   1 year ago High triglycerides   [facility] Flinchum, Eula Fried, FNP             Passed - Patient is not pregnant

## 2021-04-11 ENCOUNTER — Other Ambulatory Visit: Payer: Self-pay | Admitting: Family Medicine

## 2021-04-11 DIAGNOSIS — I10 Essential (primary) hypertension: Secondary | ICD-10-CM

## 2021-04-11 NOTE — Telephone Encounter (Signed)
Requested medication (s) are due for refill today: yes  Requested medication (s) are on the active medication list: yes  Last refill:  09/23/20 #90 1 RF  Future visit scheduled: no  Notes to clinic:  labs overdue   Requested Prescriptions  Pending Prescriptions Disp Refills   losartan (COZAAR) 25 MG tablet [Pharmacy Med Name: LOSARTAN POTASSIUM 25 MG TAB] 135 tablet 1    Sig: TAKE 1 AND 1/2 TABLETS BY MOUTH DAILY     Cardiovascular:  Angiotensin Receptor Blockers Failed - 04/11/2021 10:48 AM      Failed - Cr in normal range and within 180 days    Creatinine, Ser  Date Value Ref Range Status  07/06/2020 0.74 (L) 0.76 - 1.27 mg/dL Final          Failed - K in normal range and within 180 days    Potassium  Date Value Ref Range Status  07/06/2020 4.3 3.5 - 5.2 mmol/L Final          Failed - Last BP in normal range    BP Readings from Last 1 Encounters:  01/04/21 (!) 174/98          Failed - Valid encounter within last 6 months    Recent Outpatient Visits           9 months ago Morbid obesity with BMI of 50.0-59.9, adult (HCC)   [facility] Flinchum, Eula Fried, FNP   1 year ago Vitamin D insufficiency   [facility] Flinchum, Eula Fried, FNP   1 year ago Morbid obesity with BMI of 50.0-59.9, adult [facility])   [facility] Flinchum, Eula Fried, FNP   1 year ago Hypertension, unspecified type   [employer], Eula Fried, FNP   1 year ago High triglycerides   [facility] Flinchum, Eula Fried, FNP              Passed - Patient is not pregnant

## 2021-04-21 ENCOUNTER — Ambulatory Visit: Payer: BC Managed Care – PPO | Admitting: Adult Health

## 2021-06-13 ENCOUNTER — Other Ambulatory Visit: Payer: Self-pay | Admitting: Adult Health

## 2021-07-22 ENCOUNTER — Other Ambulatory Visit: Payer: Self-pay | Admitting: Family Medicine

## 2021-07-22 DIAGNOSIS — I10 Essential (primary) hypertension: Secondary | ICD-10-CM

## 2021-07-29 ENCOUNTER — Other Ambulatory Visit: Payer: Self-pay

## 2021-07-29 MED ORDER — HYDROCHLOROTHIAZIDE 25 MG PO TABS: 25.0000 mg | ORAL_TABLET | Freq: Every day | ORAL | 1 refills | Status: DC

## 2021-08-18 ENCOUNTER — Ambulatory Visit: Payer: BC Managed Care – PPO | Admitting: Adult Health

## 2021-08-18 ENCOUNTER — Encounter: Payer: Self-pay | Admitting: Adult Health

## 2021-08-18 VITALS — BP 123/87 | HR 70 | Ht 70.5 in | Wt 317.4 lb

## 2021-08-18 DIAGNOSIS — G4733 Obstructive sleep apnea (adult) (pediatric): Secondary | ICD-10-CM | POA: Diagnosis not present

## 2021-08-18 NOTE — Patient Instructions (Signed)
Your Plan:  Home sleep test ordered If your symptoms worsen or you develop new symptoms please let us know.   Thank you for coming to see us at [facility]. I hope we have been able to provide you high quality care today.  You may receive a patient satisfaction survey over the next few weeks. We would appreciate your feedback and comments so that we may continue to improve ourselves and the health of our patients.  

## 2021-08-18 NOTE — Progress Notes (Signed)
PATIENT: Steve [name] DOB: 1975/07/11  REASON FOR VISIT: follow up HISTORY FROM: patient PRIMARY NEUROLOGIST:  Chief Complaint  Patient presents with   Follow-up    Rm 8  cpap.  Alone.  Has not used APAP pressure too high/ mask claustrophobic.  He would like to change DME co.  Has had 65 lbs weight loss as well.  Could not get DL, did not bring machine.      HISTORY OF PRESENT ILLNESS: Today 08/18/21:  [name] is a 46 year old male with a history of OSA on CPAP. He returns today for follow-up.  Reports that he has not been using his CPAP.  States that the mask makes him claustrophobic and the pressure is too high.  The patient is also not satisfied with his DME company.  More importantly the patient has lost 65 pounds.  He continues to try to lose weight.  Patient never had split-night study.  Returns today for evaluation.  HISTORY (Copied from Dr.Dohmeier's note) Steve [name] is a 46 y.o. year old White or Caucasian male patient seen here as a referral on 01/04/2021 from FNP for a transfer of CPAP care- .  Chief concern according to patient :  Rm 11, alone. Internal referral for sleep apnea, fatigue and HTN. Patient was dx with OSA and started on CPAP 3 years ago through "[employer] The company ever did any follow up appts. Pt currently struggles w his cpap, seems to not be able to catch his breath when wearing it. Pt reports not getting the sleep he needs. Nodding while at work. Feels like his CPAP no longer seems to be working as well. He bought it on [employer]. Was never fitted for an interface.    I have the pleasure of seeing Steve JeffersonRonald Gene [name] [name] today, a right-handed White or Caucasian male with a known OSA sleep disorder. He  has a past medical history of Gall bladder inflammation, Hypertension, and Obstructive sleep apnea., Obesity, CPAP intolerance. He is suffering form depression and anxiety.    The patient had the first sleep study in the year 2019  with a  result of OSA. [employer].    Sleep relevant medical history: Insomnia, anxiety, Nocturia once a night, adenoid but no Tonsillectomy, lower spine tension pain.   Family medical /sleep history: father on CPAP with OSA.    Social history:  Patient is working as an Film/video editoroffice worker and lives in a household alone with his dog. Family status is single.   The patient currently works office hours   Tobacco use; quit 3 years ago snuff-.  ETOH use one drink a week,  Caffeine intake in form of Coffee( 2 cups a day) Soda( /) Tea ( /) or energy drinks. Regular exercise- none .       Sleep habits are as follows: The patient's dinner time is between 7 PM. The patient goes to bed at 12 PM and continues to sleep for 3 hours, wakes from sciatica and often has shorter and shorter intervals, anxiety plays a role. No bathroom breaks, loud snoring. .   The preferred sleep position is supine, and right , with the support of 2-3 pillows.  Dreams are reportedly frequent/vivid.  6-7  AM is the usual rise time. The patient wakes up spontaneously at 4 AM - in pain- He reports not feeling refreshed or restored in AM, with symptoms such as dry mouth, and  stiffness , residual fatigue. Naps  are taken infrequently, lasting from 15 to 20 minutes at work- but less refreshing than it used to be.     REVIEW OF SYSTEMS: Out of a complete 14 system review of symptoms, the patient complains only of the following symptoms, and all other reviewed systems are negative.   ESS 12  ALLERGIES: No Known Allergies  HOME MEDICATIONS: Outpatient Medications Prior to Visit  Medication Sig Dispense Refill   aspirin EC 81 MG tablet Take 81 mg by mouth daily. Swallow whole.     hydrochlorothiazide (HYDRODIURIL) 25 MG tablet Take 1 tablet (25 mg total) by mouth daily. 30 tablet 1   losartan (COZAAR) 25 MG tablet TAKE 1 AND 1/2 TABLETS DAILY BY MOUTH 45 tablet 0   levocetirizine (XYZAL) 5 MG tablet Take 1 tablet (5 mg total) by mouth  every evening. Please schedule an office visit before anymore refills. 30 tablet 0   UNABLE TO FIND CBD oil prn     No facility-administered medications prior to visit.    PAST MEDICAL HISTORY: Past Medical History:  Diagnosis Date   Gall bladder inflammation    Hypertension    Obstructive sleep apnea     PAST SURGICAL HISTORY: Past Surgical History:  Procedure Laterality Date   ADENOIDECTOMY      FAMILY HISTORY: Family History  Problem Relation Age of Onset   Diabetes Mother    Heart attack Father 75   Heart attack Maternal Grandfather    Diabetes Maternal Grandfather     SOCIAL HISTORY: Social History   Socioeconomic History   Marital status: Single    Spouse name: Not on file   Number of children: Not on file   Years of education: Not on file   Highest education level: Some college, no degree  Occupational History   Not on file  Tobacco Use   Smoking status: Former    Packs/day: 0.25    Years: 0.50    Pack years: 0.13    Types: Cigarettes    Quit date: 07/29/1999    Years since quitting: 22.0   Smokeless tobacco: Former    Types: Snuff    Quit date: 08/2018  Vaping Use   Vaping Use: Never used  Substance and Sexual Activity   Alcohol use: Yes    Alcohol/week: 2.0 - 3.0 standard drinks    Types: 2 - 3 Cans of beer per week   Drug use: No   Sexual activity: Not Currently  Other Topics Concern   Not on file  Social History Narrative   Lives alone   Left handed   Caffeine: 2 cups of coffee a day   Social Determinants of Health   Financial Resource Strain: Not on file  Food Insecurity: Not on file  Transportation Needs: Not on file  Physical Activity: Not on file  Stress: Not on file  Social Connections: Not on file  Intimate Partner Violence: Not on file      PHYSICAL EXAM  Vitals:   08/18/21 0832  BP: 123/87  Pulse: 70  Weight: (!) 317 lb 6.4 oz (144 kg)  Height: 5' 10.5" (1.791 m)   Body mass index is 44.9 kg/m.  Generalized:  Well developed, in no acute distress  Chest: Lungs clear to auscultation bilaterally  Neurological examination  Mentation: Alert oriented to time, place, history taking. Follows all commands speech and language fluent Cranial nerve [name]-XII: Extraocular movements were full, visual field were full on confrontational test Head turning and shoulder shrug  were  normal and symmetric. Gait and station: Gait is normal.    DIAGNOSTIC DATA (LABS, IMAGING, TESTING) - I reviewed patient records, labs, notes, testing and imaging myself where available.  Lab Results  Component Value Date   WBC 7.9 07/06/2020   HGB 13.0 07/06/2020   HCT 39.2 07/06/2020   MCV 84 07/06/2020   PLT 292 07/06/2020      Component Value Date/Time   NA 140 07/06/2020 1035   K 4.3 07/06/2020 1035   CL 100 07/06/2020 1035   CO2 26 07/06/2020 1035   GLUCOSE 98 07/06/2020 1035   GLUCOSE 147 (H) 05/08/2020 0450   BUN 8 07/06/2020 1035   CREATININE 0.74 (L) 07/06/2020 1035   CALCIUM 9.0 07/06/2020 1035   PROT 6.7 07/06/2020 1035   ALBUMIN 4.0 07/06/2020 1035   AST 27 07/06/2020 1035   ALT 32 07/06/2020 1035   ALKPHOS 75 07/06/2020 1035   BILITOT 0.4 07/06/2020 1035   GFRNONAA >60 05/08/2020 0450   GFRAA 123 04/22/2019 1103   Lab Results  Component Value Date   CHOL 160 07/06/2020   HDL 30 (L) 07/06/2020   LDLCALC 94 07/06/2020   TRIG 209 (H) 07/06/2020   CHOLHDL 4.6 04/22/2019   No results found for: HGBA1C No results found for: VITAMINB12 Lab Results  Component Value Date   TSH 3.070 07/06/2020      ASSESSMENT AND PLAN 46 y.o. year old male  has a past medical history of Gall bladder inflammation, Hypertension, and Obstructive sleep apnea. here with:  OSA on CPAP  - Home sleep test ordered d/t recent weight loss.  Patient is interested in restarting therapy if needed. -Pending results of home sleep test we will get him set up with a new DME company if needed. -He will follow-up after home sleep  test    Butch Penny, MSN, NP-C 08/18/2021, 8:46 AM [facility] [address], [address] [place], [place] [address] [phone]

## 2021-08-25 ENCOUNTER — Other Ambulatory Visit: Payer: Self-pay | Admitting: Family Medicine

## 2021-08-25 DIAGNOSIS — I10 Essential (primary) hypertension: Secondary | ICD-10-CM

## 2021-08-27 ENCOUNTER — Encounter: Payer: BC Managed Care – PPO | Admitting: Family

## 2021-09-01 ENCOUNTER — Telehealth: Payer: Self-pay | Admitting: Adult Health

## 2021-09-01 NOTE — Telephone Encounter (Signed)
[employer] no auth spoke to [employer] ref # [id number]. Patient HST is scheduled for 09/14/21 for 3:00 PM.

## 2021-09-14 ENCOUNTER — Ambulatory Visit: Payer: BC Managed Care – PPO | Admitting: Neurology

## 2021-09-14 DIAGNOSIS — G4733 Obstructive sleep apnea (adult) (pediatric): Secondary | ICD-10-CM

## 2021-09-14 DIAGNOSIS — E66813 Obesity, class 3: Secondary | ICD-10-CM

## 2021-09-14 DIAGNOSIS — Z789 Other specified health status: Secondary | ICD-10-CM

## 2021-09-14 DIAGNOSIS — G4719 Other hypersomnia: Secondary | ICD-10-CM

## 2021-09-17 DIAGNOSIS — Z789 Other specified health status: Secondary | ICD-10-CM | POA: Insufficient documentation

## 2021-09-17 DIAGNOSIS — G4733 Obstructive sleep apnea (adult) (pediatric): Secondary | ICD-10-CM | POA: Insufficient documentation

## 2021-09-20 ENCOUNTER — Other Ambulatory Visit: Payer: Self-pay | Admitting: Adult Health

## 2021-09-20 DIAGNOSIS — G4733 Obstructive sleep apnea (adult) (pediatric): Secondary | ICD-10-CM

## 2021-09-21 ENCOUNTER — Telehealth: Payer: Self-pay | Admitting: Adult Health

## 2021-09-27 DIAGNOSIS — M109 Gout, unspecified: Secondary | ICD-10-CM | POA: Diagnosis not present

## 2021-10-01 IMAGING — CR DG CHEST 2V
2 series · 2 of 2 positions shown · non-contrast
Comparison: None.

CLINICAL DATA: Chest pain.

EXAM:
CHEST - 2 VIEW

[chest pa]
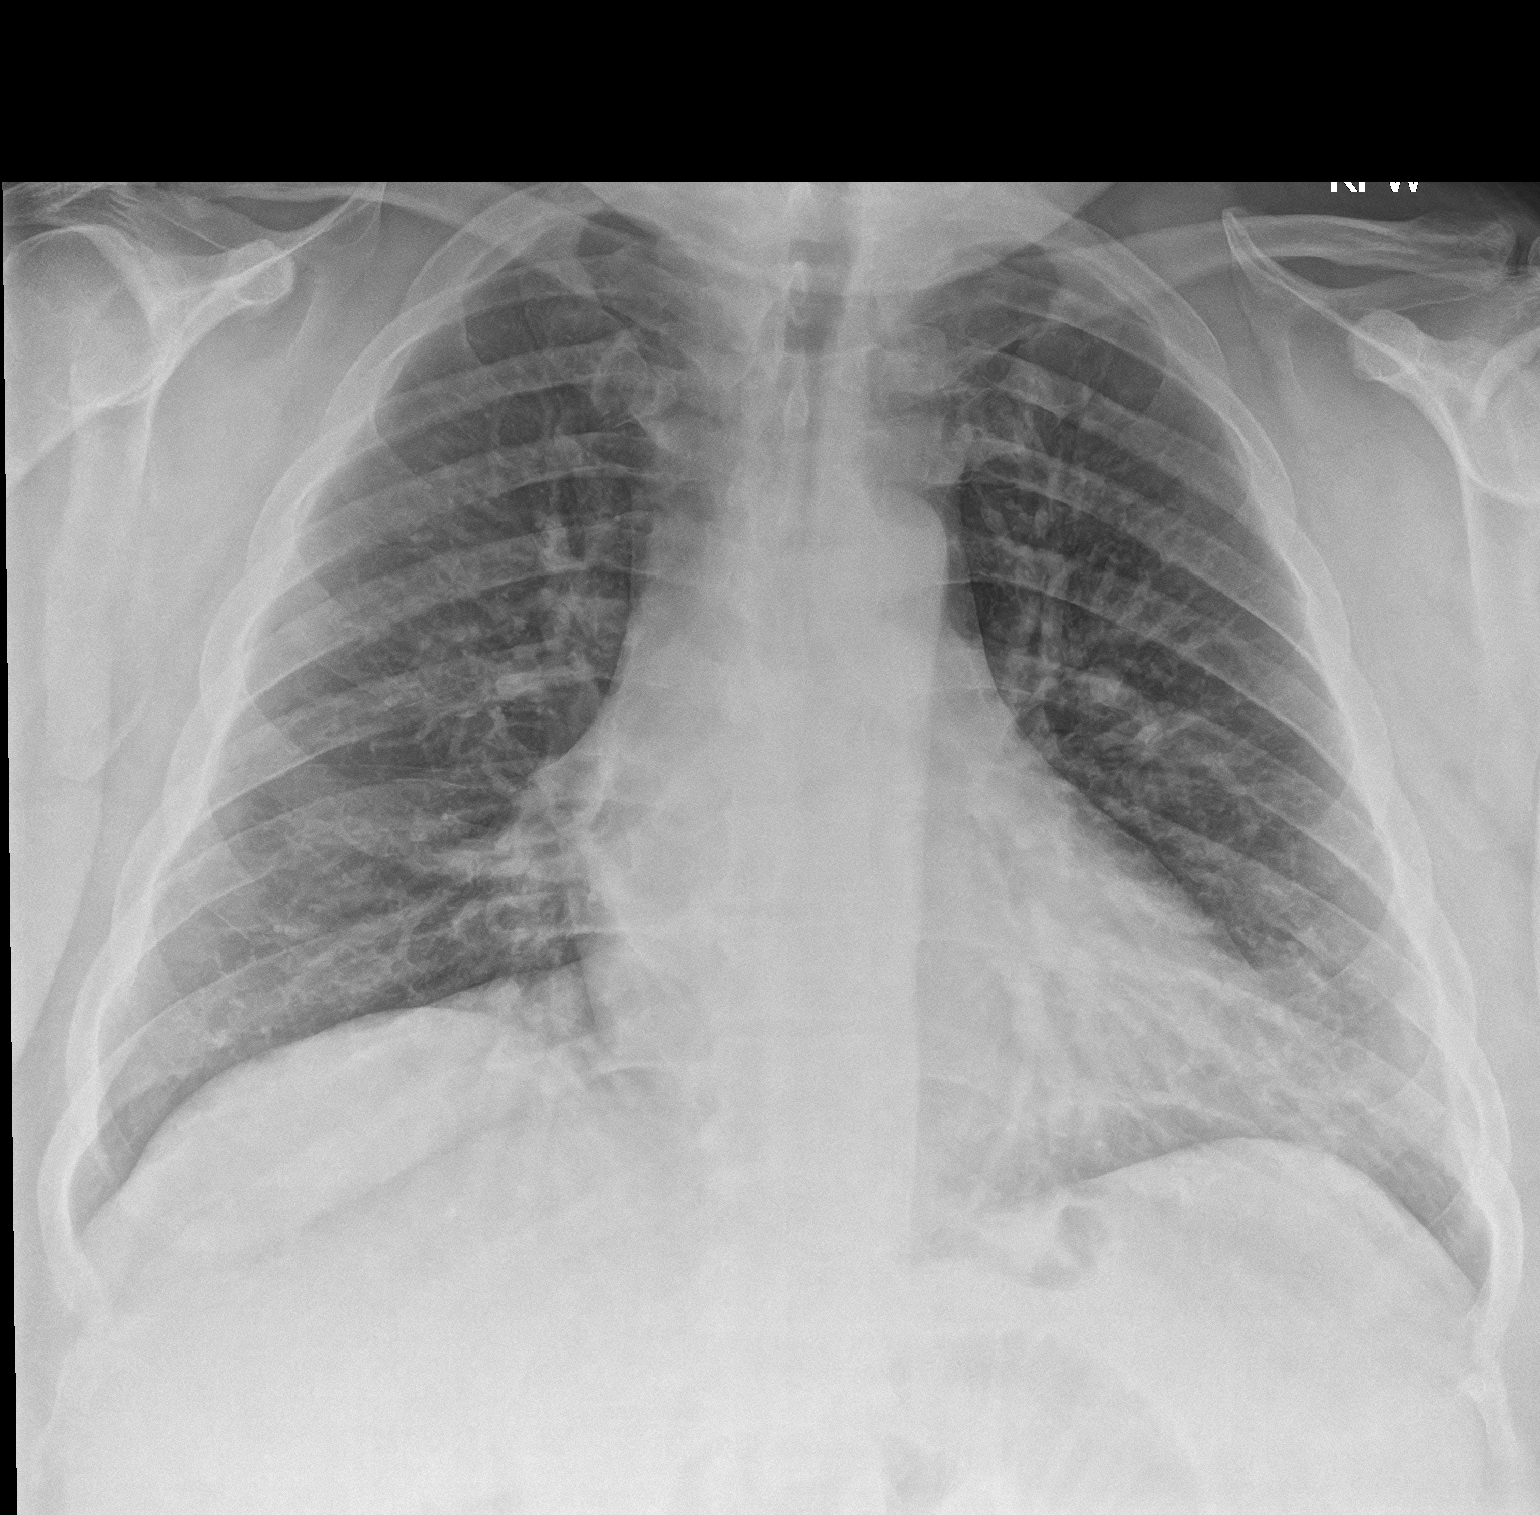

[chest lat]
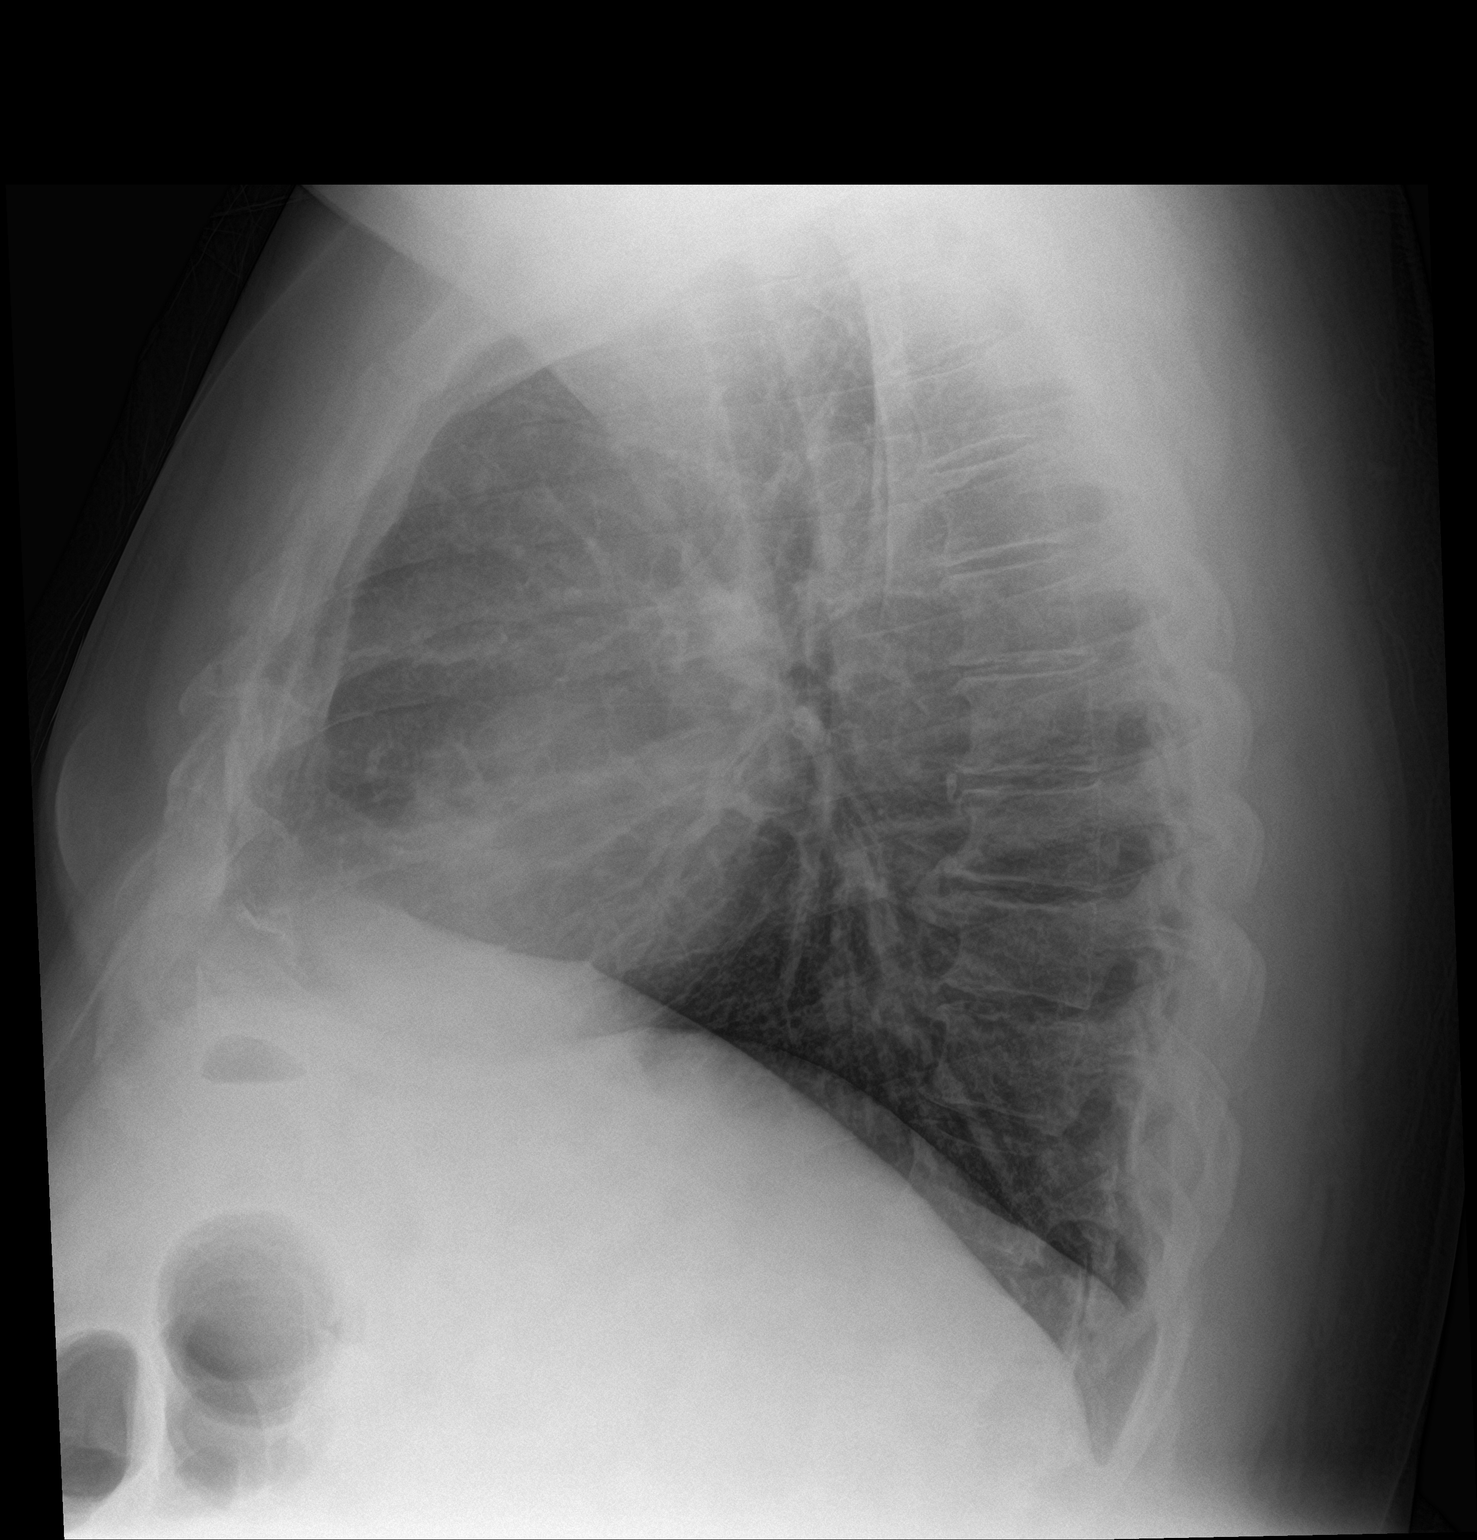

[2 of 2 positions shown; findings below may reference images not displayed]

FINDINGS: The heart size and mediastinal contours are within normal limits.
Both lungs are clear. The visualized skeletal structures are
unremarkable.
IMPRESSION: No active cardiopulmonary disease.

## 2021-10-07 NOTE — Telephone Encounter (Signed)
Patient called stating that he had to cancel due to his dog sitter cancel the last min. Patient is scheduled for 11/02/21 at 9 pm.   Mailed packet to the patient.

## 2021-11-01 ENCOUNTER — Ambulatory Visit: Payer: BC Managed Care – PPO | Admitting: Family

## 2021-11-02 ENCOUNTER — Ambulatory Visit (INDEPENDENT_AMBULATORY_CARE_PROVIDER_SITE_OTHER): Payer: BC Managed Care – PPO | Admitting: Neurology

## 2021-11-02 ENCOUNTER — Telehealth: Payer: Self-pay

## 2021-11-02 DIAGNOSIS — R0789 Other chest pain: Secondary | ICD-10-CM

## 2021-11-02 DIAGNOSIS — G4719 Other hypersomnia: Secondary | ICD-10-CM

## 2021-11-02 DIAGNOSIS — G4733 Obstructive sleep apnea (adult) (pediatric): Secondary | ICD-10-CM | POA: Diagnosis not present

## 2021-11-02 NOTE — Telephone Encounter (Signed)
Pt called to cancel tonight's sleep study appt and he is stating that he can't afford his copay. I, the Sleep Lab manager, informed the pt that he is not responsible for his copay until he receives a bill in the mail from [facility]. Then pt states that he actually can't make his appt for tonight bc he can't afford gas to get here. I informed him that he will be charged a late cancellation fee of $250.00 for cancelling less than 24 hrs. Pt was also told at last cancelled appt that he would incur this cost. Pt tried to get appt rescheduled, but unfortunately since he has 2 late cancelled appts, he will need to pay the late fee first before rescheduling. Pt was upset about this and then stated he would make tonight's appt even if he had to hitchhike.

## 2021-11-08 ENCOUNTER — Ambulatory Visit
Admission: EM | Admit: 2021-11-08 | Discharge: 2021-11-08 | Disposition: A | Discharge status: Home / Self Care | Payer: BC Managed Care – PPO | Attending: Physician Assistant | Admitting: Physician Assistant

## 2021-11-08 ENCOUNTER — Ambulatory Visit (INDEPENDENT_AMBULATORY_CARE_PROVIDER_SITE_OTHER): Payer: BC Managed Care – PPO

## 2021-11-08 DIAGNOSIS — M79642 Pain in left hand: Secondary | ICD-10-CM | POA: Diagnosis not present

## 2021-11-08 DIAGNOSIS — M79672 Pain in left foot: Secondary | ICD-10-CM

## 2021-11-08 DIAGNOSIS — M7732 Calcaneal spur, left foot: Secondary | ICD-10-CM

## 2021-11-08 MED ORDER — PREDNISONE 10 MG PO TABS: ORAL_TABLET | ORAL | 0 refills | Status: DC

## 2021-11-08 NOTE — Discharge Instructions (Addendum)
-  X-ray shows a heel spur.  This is likely inflamed and part of why you are having pain.  You also may have inflammation of your Achilles tendon. -We are try you on a corticosteroid taper pack.  He can also take Tylenol for pain and use diclofenac topical gel.  Ice and elevate the area as well. - Look into getting gel inserts for your shoes. - If no improvement in the next 7 to 10 days you may want to follow-up with podiatry or go to [facility] or [place] orthopedic clinic.  You may have a condition requiring you to follow up with Orthopedics so please call one of the following office for appointment:   [facility] [address] [place], [place] [address] Phone: [phone]  [facility] [address], [place], [place] [address] Phone: [phone]

## 2021-11-08 NOTE — ED Provider Notes (Signed)
[facility]    CSN: [id number] Arrival date & time: 11/08/21  1742      History   Chief Complaint Chief Complaint  Patient presents with   Foot Pain    HPI Steve [name] is a 46 y.o. male presenting for 2-day history of left heel and posterior ankle pain.  Patient denies injury.  Pain is initially worse upon waking up for putting pressure on the foot after sitting for a while.  He says that once he "gets going" the pain gets a little better.  He has noticed swelling of the heel as well.  Patient reports that he has been very busy lately and has not had time to treat his condition.  He does have a history of gout but denies ever having gout affects his heel or ankle.  He has not had any associated numbness, weakness or tingling.  Denies falls.  Medical history significant for hypertension, OSA, obesity.  HPI  Past Medical History:  Diagnosis Date   Gall bladder inflammation    Hypertension    Obstructive sleep apnea     Patient Active Problem List   Diagnosis Date Noted   OSA (obstructive sleep apnea) 09/17/2021   Intolerance of continuous positive airway pressure (CPAP) ventilation 09/17/2021   Severe obstructive sleep apnea-hypopnea syndrome 01/04/2021   Excessive daytime sleepiness 01/04/2021   Class 3 severe obesity due to excess calories without serious comorbidity with body mass index (BMI) of 50.0 to 59.9 in adult [facility]) 01/04/2021   Anxiety about health 01/04/2021   Vitamin D insufficiency 03/18/2020   Calculus of gallbladder without cholecystitis without obstruction 06/20/2019   Atypical chest pain 05/28/2019   Shortness of breath 05/28/2019   Abnormal EKG 05/28/2019   High triglycerides 05/23/2019   History of cholelithiasis 05/23/2019   Hypertension 05/23/2019   Morbid obesity (HCC) 04/22/2019   Use of cannabis oil 04/22/2019   Fatigue 04/22/2019   Encounter for annual physical exam 04/22/2019   RUQ abdominal pain 04/22/2019   Paresthesia  of both hands 04/22/2019   Bilateral wrist pain 04/22/2019   Morbid obesity with BMI of 50.0-59.9, adult (HCC) 04/22/2019   History of chest pain 04/22/2019   Anxiety 04/22/2019   Family history of coronary artery disease 04/22/2019    Past Surgical History:  Procedure Laterality Date   ADENOIDECTOMY         Home Medications    Prior to Admission medications   Medication Sig Start Date End Date Taking? Authorizing Provider  aspirin EC 81 MG tablet Take 81 mg by mouth daily. Swallow whole.   Yes [provider]  hydrochlorothiazide (HYDRODIURIL) 25 MG tablet Take 1 tablet (25 mg total) by mouth daily. 07/29/21  Yes Allegra Grana, FNP  losartan (COZAAR) 25 MG tablet TAKE 1 AND 1/2 TABLETS DAILY BY MOUTH 07/22/21  Yes Malva Limes, MD  predniSONE (DELTASONE) 10 MG tablet Take 6 tabs p.o. on day 1 decrease by 1 tablet daily until complete 11/08/21  Yes Shirlee Latch, PA-C    Family History Family History  Problem Relation Age of Onset   Diabetes Mother    Heart attack Father 47   Heart attack Maternal Grandfather    Diabetes Maternal Grandfather     Social History Social History   Tobacco Use   Smoking status: Former    Packs/day: 0.25    Years: 0.50    Total pack years: 0.13    Types: Cigarettes    Quit  date: 07/29/1999    Years since quitting: 22.2   Smokeless tobacco: Former    Types: Snuff    Quit date: 08/2018  Vaping Use   Vaping Use: Never used  Substance Use Topics   Alcohol use: Not Currently    Alcohol/week: 2.0 - 3.0 standard drinks of alcohol    Types: 2 - 3 Cans of beer per week   Drug use: No     Allergies   Patient has no known allergies.   Review of Systems Review of Systems  Constitutional:  Negative for fever.  Musculoskeletal:  Positive for arthralgias and joint swelling. Negative for gait problem.  Skin:  Negative for color change, rash and wound.  Neurological:  Negative for weakness and numbness.     Physical  Exam Triage Vital Signs ED Triage Vitals  Enc Vitals Group     BP      Pulse      Resp      Temp      Temp src      SpO2      Weight      Height      Head Circumference      Peak Flow      Pain Score      Pain Loc      Pain Edu?      Excl. in GC?    No data found.  Updated Vital Signs BP (!) 154/88 (BP Location: Left Arm)   Pulse 73   Temp 98.6 F (37 C) (Oral)   Resp 18   Ht 5\' 10"  (1.778 m)   Wt (!) 310 lb (140.6 kg)   SpO2 98%   BMI 44.48 kg/m      Physical Exam Vitals and nursing note reviewed.  Constitutional:      General: He is not in acute distress.    Appearance: Normal appearance. He is well-developed. He is ill-appearing.  HENT:     Head: Normocephalic and atraumatic.  Eyes:     General: No scleral icterus.    Conjunctiva/sclera: Conjunctivae normal.  Cardiovascular:     Rate and Rhythm: Normal rate.     Pulses: Normal pulses.  Pulmonary:     Effort: Pulmonary effort is normal. No respiratory distress.  Musculoskeletal:     Cervical back: Neck supple.     Left foot: Normal range of motion. Swelling (swelling at Achilles tendon) and tenderness (calcaneus and achilles tendon) present. Normal pulse.     Comments: Dry skin and stasis dermatitis of left lower extremity.  Skin:    General: Skin is warm and dry.     Capillary Refill: Capillary refill takes less than 2 seconds.  Neurological:     General: No focal deficit present.     Mental Status: He is alert. Mental status is at baseline.     Gait: Gait abnormal.  Psychiatric:        Mood and Affect: Mood normal.        Behavior: Behavior normal.      UC Treatments / Results  Labs (all labs ordered are listed, but only abnormal results are displayed) Labs Reviewed - No data to display  EKG   Radiology DG Foot Complete Left  Result Date: 11/08/2021 CLINICAL DATA:  Left heel pain x2 days EXAM: LEFT FOOT - COMPLETE 3+ VIEW COMPARISON:  None Available. FINDINGS: There is no evidence of  fracture or dislocation. Calcaneal spur at the Achilles tendon insertion. There is no evidence  of arthropathy or other focal bone abnormality. Soft tissues are unremarkable. IMPRESSION: Posterior calcaneal spur.  Otherwise negative Electronically Signed   By: Corlis Leak M.D.   On: 11/08/2021 18:36    Procedures Procedures (including critical care time)  Medications Ordered in UC Medications - No data to display  Initial Impression / Assessment and Plan / UC Course  I have reviewed the triage vital signs and the nursing notes.  Pertinent labs & imaging results that were available during my care of the patient were reviewed by me and considered in my medical decision making (see chart for details).   46 year old male presents for left heel pain and swelling for the past couple days.  Denies injury.  On exam he does have significant dry skin and states dermatitis of the left lower extremity.  He also has tenderness of the calcaneus but mostly of the Achilles tendon and where the Achilles tendon meets the calcaneus.  Full range of motion of foot and ankle.  Some discomfort with dorsiflexion but not plantarflexion.  X-ray obtained today shows posterior calcaneal spur, otherwise negative.  Discussed results patient.  Advised NSAIDs but patient prefers prednisone.  Sent prednisone taper.  Also advised Tylenol, diclofenac gel.  Reviewed RICE guidelines.  Advised gel inserts for shoes.  Advised following up with PCP, Ortho or podiatry if not improving in the next 7 to 10 days.   Final Clinical Impressions(s) / UC Diagnoses   Final diagnoses:  Pain of left heel  Calcaneal spur of left foot     Discharge Instructions      -X-ray shows a heel spur.  This is likely inflamed and part of why you are having pain.  You also may have inflammation of your Achilles tendon. -We are try you on a corticosteroid taper pack.  He can also take Tylenol for pain and use diclofenac topical gel.  Ice and elevate  the area as well. - Look into getting gel inserts for your shoes. - If no improvement in the next 7 to 10 days you may want to follow-up with podiatry or go to [facility] or [place] orthopedic clinic.  You may have a condition requiring you to follow up with Orthopedics so please call one of the following office for appointment:   [facility] [address] [place], [place] [address] Phone: [phone]  [facility] [address] [place], [place] [address] Phone: [phone]      ED Prescriptions     Medication Sig Dispense Auth. Provider   predniSONE (DELTASONE) 10 MG tablet Take 6 tabs p.o. on day 1 decrease by 1 tablet daily until complete 21 tablet Shirlee Latch, PA-C      PDMP not reviewed this encounter.   Shirlee Latch, PA-C 11/08/21 1901

## 2021-11-08 NOTE — ED Triage Notes (Signed)
Pt c/o left heel tenderness when walking x2days.  Pt found a lump on the back of the ankle during triage and states that the pain is happening from the lump.  Pt states that his exercise shoes are tight and he only wears low cut shoes

## 2021-11-19 DIAGNOSIS — I10 Essential (primary) hypertension: Secondary | ICD-10-CM | POA: Diagnosis not present

## 2021-11-22 DIAGNOSIS — G4733 Obstructive sleep apnea (adult) (pediatric): Secondary | ICD-10-CM | POA: Insufficient documentation

## 2021-11-22 NOTE — Progress Notes (Signed)
I like for Steve [name] to restart using his auto  -CPAP with the newly fitted mask and under the settings from 6-  15 cm water with 3 cm EPR.  After 30-80 days of therapy with more  than 4 hours daily use of CPAP, this patient will follow up with  NP [place] to document compliance and Epworth score. If the  patient still feels that claustrophobia affects his CPAP  tolerance, we can issue a sleep aid for 30 days.

## 2021-11-22 NOTE — Procedures (Signed)
[facility] CPAP TITRATION INTERPRETATION REPORT   STUDY DATE: 11/02/2021      PATIENT NAME:  Steve [name], Steve [name]         DATE OF BIRTH:  1975/06/07  PATIENT ID:  [id number]    TYPE OF STUDY:  CPAP  READING PHYSICIAN: Melvyn Novas, MD REFERRED BY: Butch Penny. NP SCORING TECHNICIAN: Margaretann Loveless, RPSGT   HISTORY:   08/18/21:?M. Ethelene Browns, NP -Mr. Derrig is a 46 year old male with a history of OSA on CPAP. He returns today for follow-up and reports that he has not been using his CPAP.  States that the mask makes him claustrophobic and the pressure is too high.  The patient is also not satisfied with his DME company.  More importantly,  the patient has lost 65 pounds.  He continues to try to lose weight.  Patient never had the ordered split-night study. He also did not bring his machine to this visit. This HST followed on 09-16-2021:  Calculated pAHI (per hour): 69.2 /h . O2 Saturation (minutes) <89%: 70.4 minutes of total hypoxia time the equivalent of over 15% of total sleep time.   Pulse Range was between 30 bpm and 105 bpm, the mean heart rate 58 beats per minutes.This degree of apnea, hypoxia and its detrimental effect on the patient's heart rate cannot be treated with any other means aside from positive airway pressure. ?The patient did have an auto CPAP at his disposal but apparently is not able to tolerate CPAP. ?I would urgently request this patient to come to the sleep lab to confirm in an attended study that he is not developing any central apnea and to allow him to advance to BiPAP, BiPAP ST, as needed for his comfort.  I am sorry to say that his 65 pound weight loss seems not to have decreased his sleep apnea. I will order an urgent titration study for this patient with a goal of a full night titration starting just below the 95th percentile pressure that he is currently using on auto CPAP, and dedicated meeting with the tech to?perform an interface  desensitization. If the patient cannot reach sustained sleep on CPAP, we will document the intolerance and move onto BiPAP or possible BiPAP ST.?I would prefer bed #2 for this patient. The Epworth Sleepiness Scale was endorsed at 12 out of 24 points (scores above or equal to 10 are suggestive of hypersomnolence). FSS was endorsed at : NA   Height: 70.5 in Weight: 317 lb (BMI 44) Neck Size: NA in MEDICATIONS: Aspirin, Hydrodiuril, Xyzal   TECHNICAL DESCRIPTION: A sleep technologist was in attendance for the duration of the recording.  Data collection, scoring, video monitoring, and reporting were performed in compliance with the AASM Manual for the Scoring of Sleep and Associated Events; (Hypopnea is scored based on the criteria listed in Section VIII D. 1b in the AASM Manual V2.6 using a 4% oxygen desaturation rule or Hypopnea is scored based on the criteria listed in Section VIII D. 1a in the AASM Manual V2.6 using 3% oxygen desaturation and /or arousal rule).  A physician certified by the American Board of Sleep Medicine reviewed each epoch of the study.    SLEEP ARCHITECTURE:  The patient was fitted first with a N 20 ResMed nasal mask due to claustrophobia, but this caused a larger air leak- switched to [employer] EVORA FFM in size S-M, started CPAP titration at 5 cm water with 2 cm EPR. During REM sleep,  his AHI increased significantly, and pressure had to be adjusted accordingly. The patient finally used CPAP at a pressure of 13 cm water, EPR of 3 cm water .   Lights off was at 22:14: and lights on 05:17: (7.0 hours in bed). Total sleep time was 401.5 minutes. BODY POSITION: Duration of total sleep and percent of total sleep in their respective position is as follows: supine 159 minutes (39.6%), non-supine 242.5 minutes (60.4%); right 242 minutes (60.4%), left 00 minutes (0.0%), and prone 00 minutes (0.0%). Total supine REM sleep time was 55 minutes (61.3% of total REM sleep)., with 22.5% total REM sleep  overall, and  with a high sleep efficiency at 95.1%. Sleep latency was normal at 8.0 minutes.  REM sleep latency was decreased at 49.0 minutes. Of the total sleep time, the percentage of stage N1 sleep was 3.6%, stage N2 sleep was 73.8%, stage N3 sleep was 0.0%, and REM sleep was 22.5%.  There were 6 Stage R periods observed on this study night, 11 awakenings (i.e. transitions to Stage W from any sleep stage), and 48.0 total stage transitions. Wake after sleep onset (WASO) time accounted for 12 minutes.   RESPIRATORY MONITORING:   Based on AASM criteria (using a 3% oxygen desaturation and /or arousal rule for scoring hypopneas), there were 0 apneas and 62 hypopneas. Apnea index was 0.0. Hypopnea index was 9.3/h . The apnea-hypopnea index was 9.3/h overall (10.2/h in supine, 20.6/h in non-supine; 22.5/h in  REM sleep, 23.8/h in supine REM).   OXIMETRY: Total sleep time spent at, or below 89% was 1.1 minutes, or 0.3% of total sleep time.  Respiratory events were associated with oxyhemoglobin desaturations at a nadir during sleep at 69%,  desaturations based from a mean oxygen saturation of 96%.  Snoring was classified as not significant, no RERAS were scored. There were 0 occurrences of Cheyne Stokes breathing. LIMB MOVEMENTS: There were 0 periodic limb movements of sleep. AROUSAL: There were 39 arousals in total, for an arousal index of 5.8 arousals/hour.  Of these, 7 were identified as respiratory-related arousals (1.0 /h), 0 were PLM-related arousals (0.0 /h), and 38 were non-specific arousals (5.7 /h) EEG: - EEG was of normal amplitude and symmetry.  EKG/ CARDIAC: The electrocardiogram documented normal sinus rhythm. The average heart rate during sleep was 61 bpm, between a minimum  of 53 bpm and a maximum heart rate at 89 bpm.   IMPRESSION:   1. CPAP at a final pressure of 13 cm water with 3 cm EPR reduced the AHI (Apnea -Hypopnea Index/ h)  to 1.88/h over a recorded sleep time of 128 minutes.  REM  sleep was seen, but no supine sleep was recorded. All sleep was recorded in lateral sleep position, and oxygen nadir for this setting was at or above 90%.    2. No snoring was recorded.    3.Sleep efficiency was high, reaching 95% of recorded time.      RECOMMENDATIONS: I like for Mr. Shilling to restart using his auto -CPAP with the newly fitted mask and under the settings from 6- 15 cm water with 3 cm EPR.  After 30-80 days of therapy with more than 4 hours daily use of CPAP, this patient will follow up with NP Millikan to document compliance and Epworth score. If the patient still feels that claustrophobia affects his CPAP tolerance, we can issue a sleep aid for 30 days.    Melvyn Novas,  MD

## 2021-11-22 NOTE — Addendum Note (Signed)
Addended by: Enedina Finner on: 11/22/2021 03:22 PM   Modules accepted: Orders

## 2021-11-24 ENCOUNTER — Telehealth: Payer: Self-pay | Admitting: *Deleted

## 2021-11-24 NOTE — Telephone Encounter (Signed)
Faxed to DME.  With confirmation received.

## 2021-11-24 NOTE — Telephone Encounter (Signed)
-----   Message from Butch Penny, NP sent at 11/22/2021  3:22 PM EDT ----- Please let patient know that his titration showed that he was best treated at 13 cm of water.  Dr. Vickey Huger recommended AutoSet 6 to 15 cm of water with EPR 3.  I will place order to his DME company

## 2021-12-22 IMAGING — CR DG CHEST 2V
2 series · 2 of 2 positions shown · non-contrast
Comparison: 01/03/2019

CLINICAL DATA: Chest pain for 1 day.

EXAM:
CHEST - 2 VIEW

[chest pa]
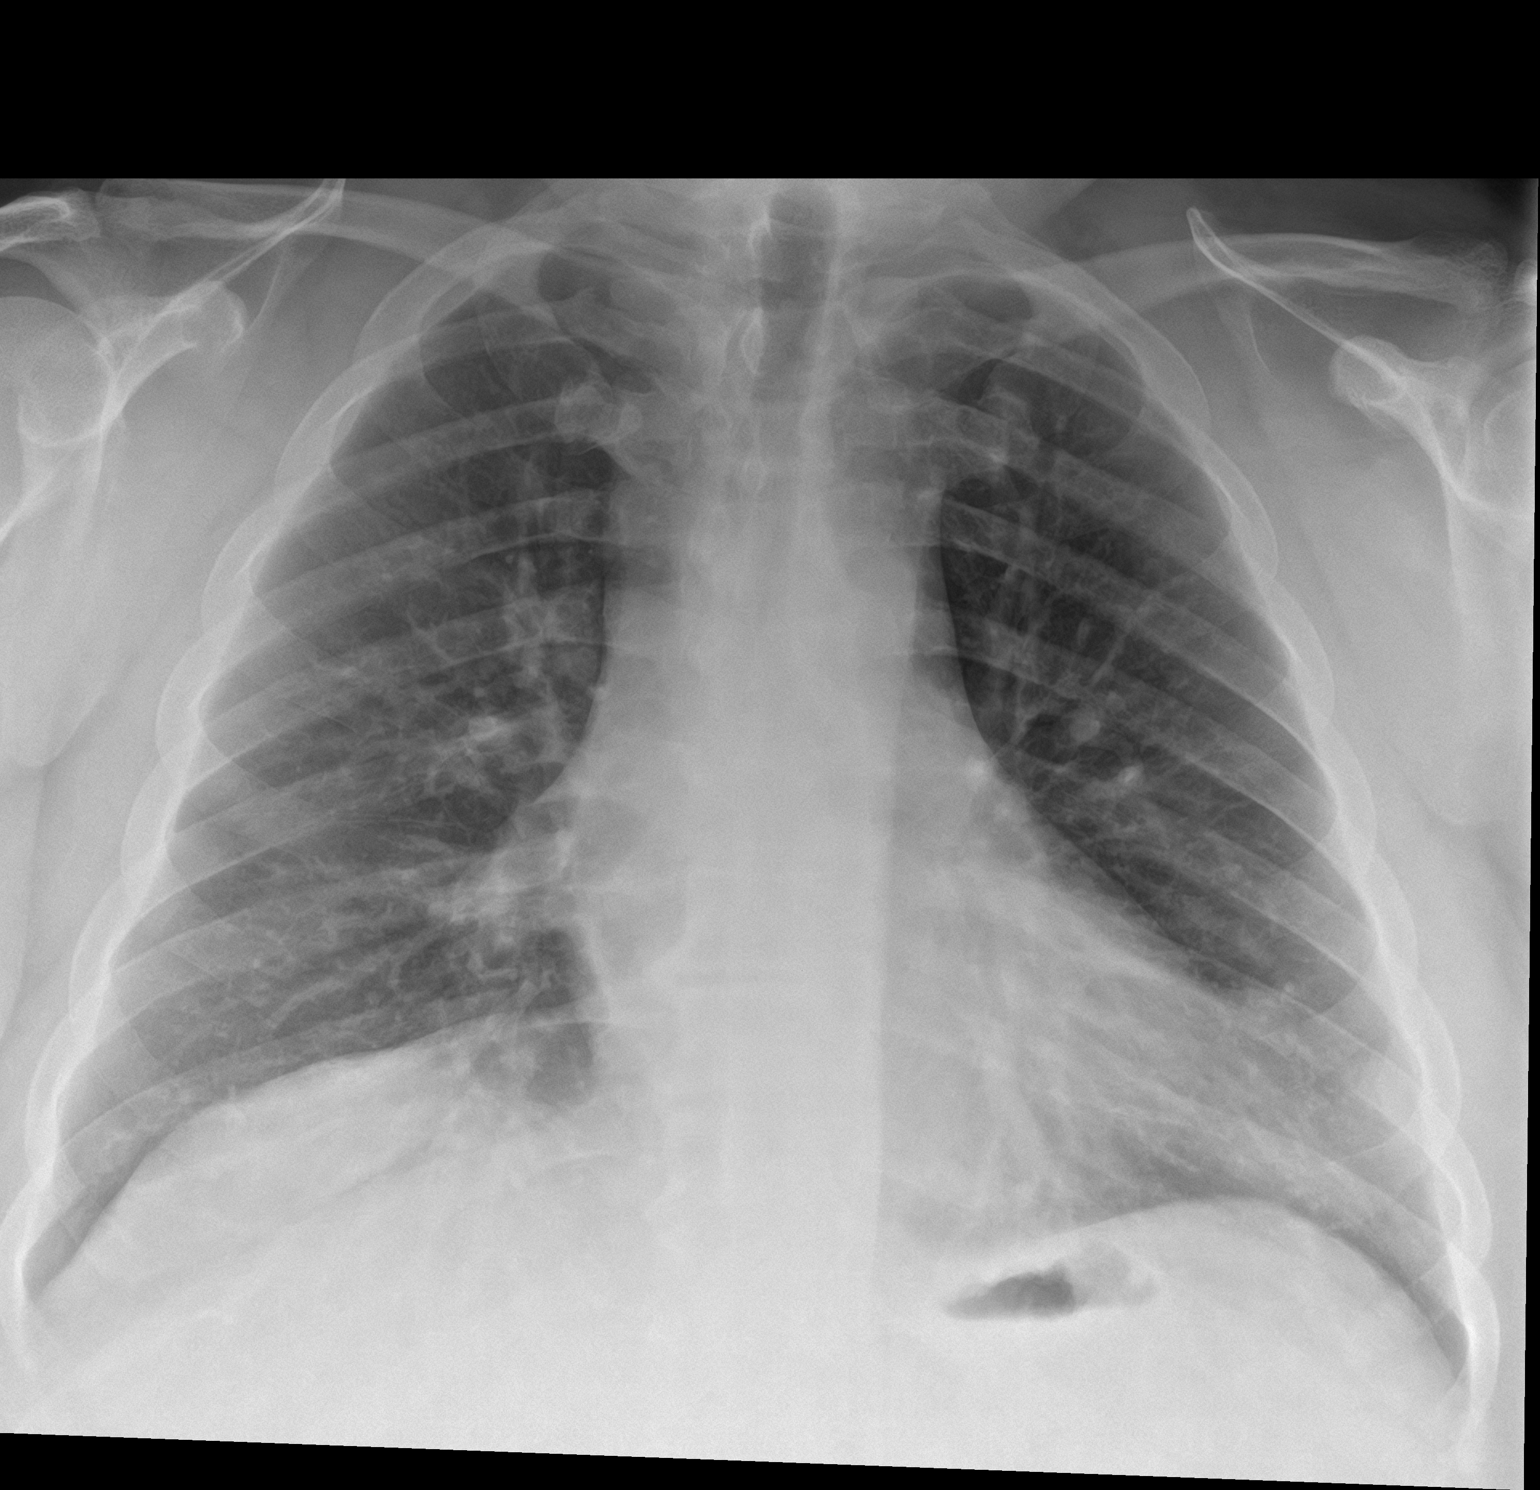

[chest lat]
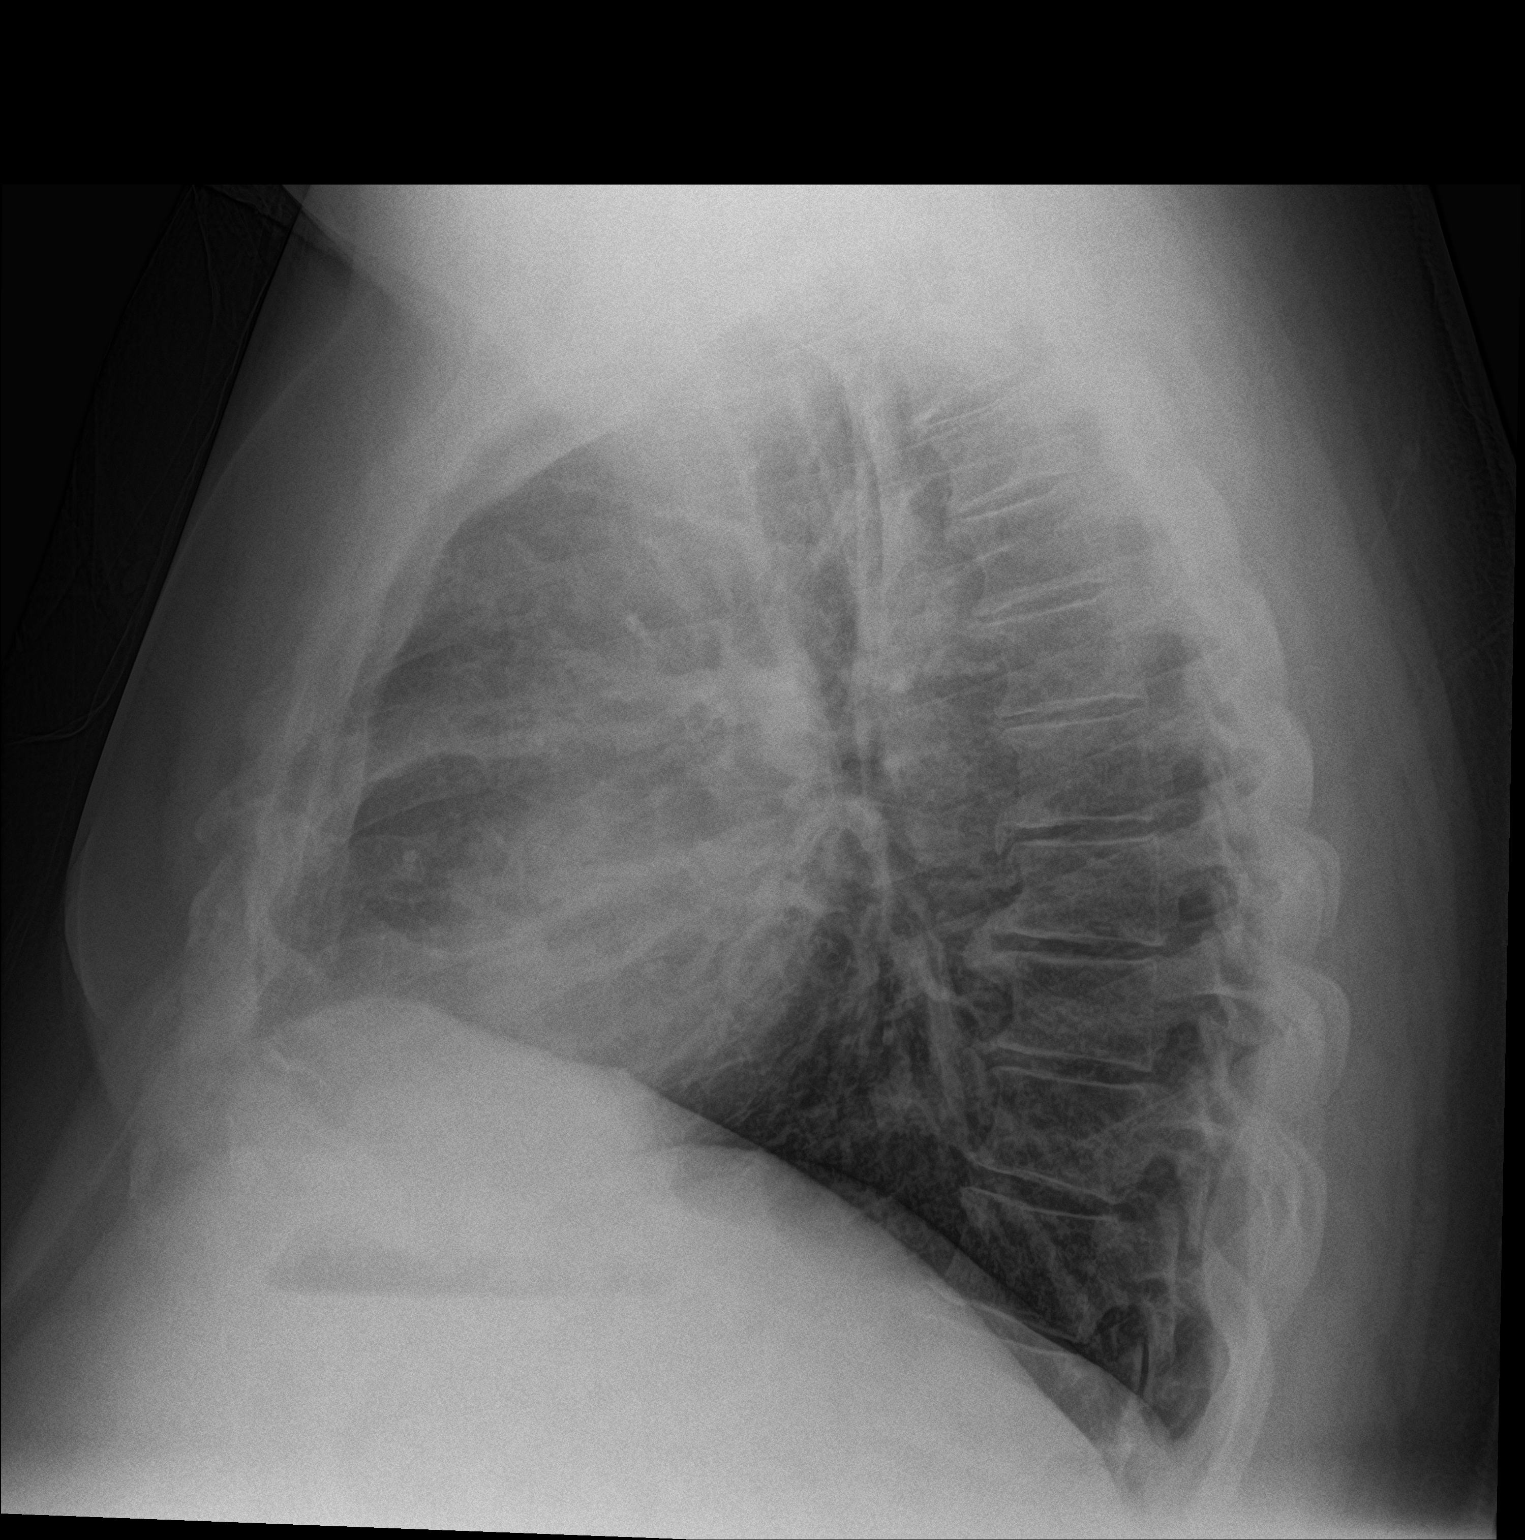

[2 of 2 positions shown; findings below may reference images not displayed]

FINDINGS: The heart size and mediastinal contours are within normal limits.
Both lungs are clear. The visualized skeletal structures are
unremarkable.
IMPRESSION: No active cardiopulmonary disease.

## 2022-01-03 ENCOUNTER — Telehealth: Payer: Self-pay | Admitting: Adult Health

## 2022-01-03 NOTE — Telephone Encounter (Signed)
Pt has machine,  per Dr. Brett Fairy last order was autocpap : 11-22-2021: RECOMMENDATIONS: I like for Steve [name] to restart using his auto -CPAP with the newly fitted mask and under the settings from 6- 15 cm water with 3 cm EPR.  After 30-80 days of therapy with more han 4 hours daily use of CPAP, this patient will follow up with  NP Millikan to document compliance and Epworth score. If the patient still feels that claustrophobia affects his CPAP tolerance, we can issue a sleep aid for 30 days.

## 2022-01-03 NOTE — Telephone Encounter (Signed)
I called pt and LMVM for him to return call.  

## 2022-01-03 NOTE — Telephone Encounter (Signed)
Pt states he has been informed that his cost for CPAP would be around $700, he is unable to afford this and would like a call to discuss options.

## 2022-01-03 NOTE — Telephone Encounter (Signed)
Pt called back. Requesting a call back from nurse.  

## 2022-01-04 ENCOUNTER — Encounter: Payer: Self-pay | Admitting: *Deleted

## 2022-01-04 NOTE — Telephone Encounter (Signed)
I called pt and he stated that his DME is charging him $630.00 for deductible and then monthly payment for new machine.  He cannot afford.  I relayed that we do not have any say in the DME part and cost but can send to new DME and see if any lower.  (Payment plan etc).  He wanted a new DME when in for visit, but I did not see this and sent to his previous DME.  I apologized and he was forgiving.  I will forward order to new DME [employer].  Hopefully will be less costly for him.  He lives in [place] and works in [place], [place].

## 2022-01-04 NOTE — Telephone Encounter (Signed)
Community message sent to [employer] 

## 2022-01-04 NOTE — Telephone Encounter (Signed)
Opened in error

## 2022-01-04 NOTE — Telephone Encounter (Signed)
New, Willodean Rosenthal, RN; Darlina Guys; [place], Clovis Riley Received, Thank you!      Previous Messages    ----- Message -----  From: Brandon Melnick, RN  Sent: 01/04/2022   2:38 PM EDT  To: Darlina Guys; Miquel Dunn; Stephannie Peters; *  Subject: new client using autopap                       New patient for [facility]   Steve [name]"  Male, 46 y.o., 10-Apr-1975  MRN:  [id number]  Phone:  [phone] (M)    New order in [place].  He has [place] [phone]     [employer]

## 2022-01-26 DIAGNOSIS — G4733 Obstructive sleep apnea (adult) (pediatric): Secondary | ICD-10-CM | POA: Diagnosis not present

## 2022-01-27 NOTE — Telephone Encounter (Signed)
Called pt. Left vm to call office to schedule initial CPAP appointment.

## 2022-01-27 NOTE — Telephone Encounter (Signed)
Received fax from [place]. Pt was setup on an Airsense 11 machine on 01/26/22. Pt will need an initial follow-up between 02/26/22 and 04/26/22.

## 2022-01-31 NOTE — Telephone Encounter (Signed)
Called pt again to schedule his Initial Cpap visit. LVM for pt to call back and schedule.

## 2022-02-01 ENCOUNTER — Other Ambulatory Visit: Payer: Self-pay | Admitting: Family Medicine

## 2022-02-01 DIAGNOSIS — I10 Essential (primary) hypertension: Secondary | ICD-10-CM

## 2022-02-01 NOTE — Telephone Encounter (Signed)
Patient need office visit.  

## 2022-02-02 ENCOUNTER — Telehealth: Payer: Self-pay | Admitting: Family Medicine

## 2022-02-02 ENCOUNTER — Other Ambulatory Visit: Payer: Self-pay | Admitting: Family Medicine

## 2022-02-02 DIAGNOSIS — I10 Essential (primary) hypertension: Secondary | ICD-10-CM

## 2022-02-02 MED ORDER — LOSARTAN POTASSIUM-HCTZ 100-25 MG PO TABS: 1.0000 | ORAL_TABLET | Freq: Every day | ORAL | 0 refills | Status: DC

## 2022-02-02 NOTE — Telephone Encounter (Signed)
Patient called and advised due to not being seen in about 2 years at [facility], the refills were denied. He says he was seeing Marvell Fuller when she was here and wanted to follow her, but was not able to find where she went. He says he's received his last refill by a telehealth visit, but was told it would just be that one time. He says if he could just receive enough until he's seen so that his BP doesn't go up. He is out of the HCTZ and will be out of the Losartan in about 6 days. I advised if BP goes up to go to the UC or ED, I will send this to [facility] for review, but no guarantee she will refill, he verbalized understanding.

## 2022-02-02 NOTE — Telephone Encounter (Signed)
Medication Refill - Medication:   losartan (COZAAR) 25 MG tablet, hydrochlorothiazide (HYDRODIURIL) 25 MG tablet   Pt called in requesting medication refills. Pt is scheduled for 02/11/2022 with Elise,[place].Pt is requesting a refill to get him through until his appointment stated he has six pills left.  Has the patient contacted their pharmacy? No. No, more refills. (Agent: If no, request that the patient contact the pharmacy for the refill. If patient does not wish to contact the pharmacy document the reason why and proceed with request.)   Preferred Pharmacy (with phone number or street name):  [facility] [address], [place] - [address]  [address] [place] [address]  Phone: [phone] Fax: [phone]  Hours: Not open 24 hours   Has the patient been seen for an appointment in the last year OR does the patient have an upcoming appointment? Yes.    Agent: Please be advised that RX refills may take up to 3 business days. We ask that you follow-up with your pharmacy.

## 2022-02-07 ENCOUNTER — Telehealth: Payer: Self-pay

## 2022-02-07 NOTE — Telephone Encounter (Signed)
  Chief Complaint: Medication question. Symptoms: none Frequency: November 8th Pertinent Negatives: Patient denies  Disposition: [] ED /[] Urgent Care (no appt availability in office) / [] Appointment(In office/virtual)/ []  [place] [facility]/ [] Home Care/ [] Refused Recommended Disposition /[] [place] [facility]/ [x]  Follow-up with PCP Additional Notes: On 02/02/2022 pt was given a refill to last until upcoming appt. Medication prescribed was Hyzaar ( Losartan -hctz combination drug).  100mg  losartan and  25mg  HCTZ.  Previously pt was on 25mg  of losartan and 25 mg of HCTZ.  Pt is wondering why dosage was changed on the losartan.  Please advise pt.         Copied from CRM [id number]. Topic: General - Other >> Feb 07, 2022 11:56 AM Everette C wrote: Reason for CRM: The patient has recently been prescribed losartan-hydrochlorothiazide (HYZAAR) 100-25 MG tablet   The patient would like to further discuss their prescription and confirm that they are taking the correct amount   The patient has had no side effects from the medication currently     Pease contact further when possible

## 2022-02-07 NOTE — Telephone Encounter (Signed)
Copied from CRM [id number]. Topic: General - Other >> Feb 07, 2022 11:56 AM Steve [name] wrote: Reason for CRM: The patient has recently been prescribed losartan-hydrochlorothiazide (HYZAAR) 100-25 MG tablet [[id number]]  The patient would like to further discuss their prescription and confirm that they are taking the correct amount  The patient has had no side effects from the medication currently    Pease contact further when possible

## 2022-02-07 NOTE — Telephone Encounter (Signed)
Patient called, left VM to return the call to the office to discuss his medication question.

## 2022-02-07 NOTE — Progress Notes (Deleted)
      Established patient visit   Patient: Steve [name]   DOB: 1975/07/22   46 y.o. Male  MRN: [id number] Visit Date: 02/11/2022  Today's healthcare provider: Gwyneth Sprout, FNP   No chief complaint on file.  Subjective    HPI  Hypertension, follow-up  BP Readings from Last 3 Encounters:  11/08/21 (!) 154/88  08/18/21 123/87  01/04/21 (!) 174/98   Wt Readings from Last 3 Encounters:  11/08/21 (!) 310 lb (140.6 kg)  08/18/21 (!) 317 lb 6.4 oz (144 kg)  01/04/21 (!) 374 lb 8 oz (169.9 kg)     He was last seen for hypertension 18 month ago.  BP at that visit was 159/97. Management since that visit includes continue medications.  He reports {excellent/good/fair/poor:19665} compliance with treatment. He {is/is not:9024} having side effects. {document side effects if present:1} He is following a {diet:21022986} diet. He {is/is not:9024} exercising. He {does/does not:200015} smoke.  Use of agents associated with hypertension: {bp agents assoc with hypertension:511::"none"}.   Outside blood pressures are {***enter patient reported home BP readings, or 'not being checked':1}. Symptoms: {Yes/No:20286} chest pain {Yes/No:20286} chest pressure  {Yes/No:20286} palpitations {Yes/No:20286} syncope  {Yes/No:20286} dyspnea {Yes/No:20286} orthopnea  {Yes/No:20286} paroxysmal nocturnal dyspnea {Yes/No:20286} lower extremity edema   Pertinent labs Lab Results  Component Value Date   CHOL 160 07/06/2020   HDL 30 (L) 07/06/2020   LDLCALC 94 07/06/2020   TRIG 209 (H) 07/06/2020   CHOLHDL 4.6 04/22/2019   Lab Results  Component Value Date   NA 140 07/06/2020   K 4.3 07/06/2020   CREATININE 0.74 (L) 07/06/2020   EGFR 115 07/06/2020   GLUCOSE 98 07/06/2020   TSH 3.070 07/06/2020     The 10-year ASCVD risk score (Arnett DK, et al., 2019) is: 4.3%  ---------------------------------------------------------------------------------------------------    Medications: Outpatient Medications Prior to Visit  Medication Sig   aspirin EC 81 MG tablet Take 81 mg by mouth daily. Swallow whole.   losartan-hydrochlorothiazide (HYZAAR) 100-25 MG tablet Take 1 tablet by mouth daily. Contains both medications   No facility-administered medications prior to visit.    Review of Systems  {Labs  Heme  Chem  Endocrine  Serology  Results Review (optional):23779}   Objective    There were no vitals taken for this visit. {Show previous vital signs (optional):23777}  Physical Exam  ***  No results found for any visits on 02/11/22.  Assessment & Plan     ***  No follow-ups on file.      {provider attestation***:1}   Gwyneth Sprout, [place] [phone] (phone) [phone] (fax)  [place]

## 2022-02-08 DIAGNOSIS — U071 COVID-19: Secondary | ICD-10-CM | POA: Diagnosis not present

## 2022-02-09 NOTE — Telephone Encounter (Signed)
Spoke with patient and advised

## 2022-02-11 ENCOUNTER — Ambulatory Visit: Payer: BC Managed Care – PPO | Admitting: Family Medicine

## 2022-02-23 NOTE — Progress Notes (Deleted)
      Established patient visit   Patient: Steve [name]   DOB: 1975-07-29   46 y.o. Male  MRN: [id number] Visit Date: 02/25/2022  Today's healthcare provider: Gwyneth Sprout, FNP   No chief complaint on file.  Subjective    HPI  Hypertension, follow-up  BP Readings from Last 3 Encounters:  11/08/21 (!) 154/88  08/18/21 123/87  01/04/21 (!) 174/98   Wt Readings from Last 3 Encounters:  11/08/21 (!) 310 lb (140.6 kg)  08/18/21 (!) 317 lb 6.4 oz (144 kg)  01/04/21 (!) 374 lb 8 oz (169.9 kg)     He was last seen for hypertension 1 years ago.  BP at that visit was 159/97. Management since that visit includes only taking hyzaar 100-29m  He reports {excellent/good/fair/poor:19665} compliance with treatment. He {is/is not:9024} having side effects. {document side effects if present:1} He is following a {diet:21022986} diet. He {is/is not:9024} exercising. He {does/does not:200015} smoke.  Use of agents associated with hypertension: {bp agents assoc with hypertension:511::"none"}.   Outside blood pressures are {***enter patient reported home BP readings, or 'not being checked':1}. Symptoms: {Yes/No:20286} chest pain {Yes/No:20286} chest pressure  {Yes/No:20286} palpitations {Yes/No:20286} syncope  {Yes/No:20286} dyspnea {Yes/No:20286} orthopnea  {Yes/No:20286} paroxysmal nocturnal dyspnea {Yes/No:20286} lower extremity edema   Pertinent labs Lab Results  Component Value Date   CHOL 160 07/06/2020   HDL 30 (L) 07/06/2020   LDLCALC 94 07/06/2020   TRIG 209 (H) 07/06/2020   CHOLHDL 4.6 04/22/2019   Lab Results  Component Value Date   NA 140 07/06/2020   K 4.3 07/06/2020   CREATININE 0.74 (L) 07/06/2020   EGFR 115 07/06/2020   GLUCOSE 98 07/06/2020   TSH 3.070 07/06/2020     The 10-year ASCVD risk score (Arnett DK, et al., 2019) is: 3.5%  ---------------------------------------------------------------------------------------------------    Medications: Outpatient Medications Prior to Visit  Medication Sig   aspirin EC 81 MG tablet Take 81 mg by mouth daily. Swallow whole.   losartan-hydrochlorothiazide (HYZAAR) 100-25 MG tablet Take 1 tablet by mouth daily. Contains both medications   No facility-administered medications prior to visit.    Review of Systems  {Labs  Heme  Chem  Endocrine  Serology  Results Review (optional):23779}   Objective    There were no vitals taken for this visit. {Show previous vital signs (optional):23777}  Physical Exam  ***  No results found for any visits on 02/25/22.  Assessment & Plan     ***  No follow-ups on file.      {provider attestation***:1}   EGwyneth Sprout [phone](phone) [phone](fax)  [place]

## 2022-02-25 ENCOUNTER — Ambulatory Visit: Payer: BC Managed Care – PPO | Admitting: Family Medicine

## 2022-02-25 DIAGNOSIS — G4733 Obstructive sleep apnea (adult) (pediatric): Secondary | ICD-10-CM | POA: Diagnosis not present

## 2022-02-26 ENCOUNTER — Other Ambulatory Visit: Payer: Self-pay | Admitting: Family Medicine

## 2022-02-26 DIAGNOSIS — I10 Essential (primary) hypertension: Secondary | ICD-10-CM

## 2022-02-28 NOTE — Telephone Encounter (Signed)
Requested medication (s) are due for refill today: routing for review  Requested medication (s) are on the active medication list: yes  Last refill:  02/02/22  Future visit scheduled: no  Notes to clinic:  Unable to refill per protocol, appointment needed.      Requested Prescriptions  Pending Prescriptions Disp Refills   losartan-hydrochlorothiazide (HYZAAR) 100-25 MG tablet [Pharmacy Med Name: LOSARTAN-HCTZ 100-25 MG TAB] 90 tablet 1    Sig: Take 1 tablet by mouth daily. Contains both medications     Cardiovascular: ARB + Diuretic Combos Failed - 02/26/2022  1:30 PM      Failed - K in normal range and within 180 days    Potassium  Date Value Ref Range Status  07/06/2020 4.3 3.5 - 5.2 mmol/L Final         Failed - Na in normal range and within 180 days    Sodium  Date Value Ref Range Status  07/06/2020 140 134 - 144 mmol/L Final         Failed - Cr in normal range and within 180 days    Creatinine, Ser  Date Value Ref Range Status  07/06/2020 0.74 (L) 0.76 - 1.27 mg/dL Final         Failed - eGFR is 10 or above and within 180 days    GFR calc Af Amer  Date Value Ref Range Status  04/22/2019 123 >59 mL/min/1.73 Final   GFR, Estimated  Date Value Ref Range Status  05/08/2020 >60 >60 mL/min Final    Comment:    (NOTE) Calculated using the CKD-EPI Creatinine Equation (2021)    eGFR  Date Value Ref Range Status  07/06/2020 115 >59 mL/min/1.73 Final         Failed - Last BP in normal range    BP Readings from Last 1 Encounters:  11/08/21 (!) 154/88         Failed - Valid encounter within last 6 months    Recent Outpatient Visits           1 year ago Morbid obesity with BMI of 50.0-59.9, adult ([place])   [place] Flinchum, Kelby Aline, FNP   1 year ago Vitamin D insufficiency   [place] Flinchum, Kelby Aline, FNP   2 years ago Morbid obesity with BMI of 50.0-59.9, adult ([place])   [place] Flinchum, Kelby Aline,  FNP   2 years ago Hypertension, unspecified type   [facility], Kelby Aline, FNP   2 years ago High triglycerides   [place], Kelby Aline, FNP       Future Appointments             In 2 days Gwyneth Sprout, [place], [place] - Patient is not pregnant

## 2022-03-01 ENCOUNTER — Other Ambulatory Visit: Payer: Self-pay

## 2022-03-01 NOTE — Progress Notes (Unsigned)
Established patient visit  Patient: Steve [name]   DOB: Sep 25, 1975   46 y.o. Male  MRN: [id number] Visit Date: 03/02/2022  Today's healthcare provider: Gwyneth Sprout, FNP  Patient presents for new patient visit to establish care.  Introduced to Designer, jewellery role and practice setting.  All questions answered.  Discussed provider/patient relationship and expectations.  I,Lakisa Lotz J Julena Barbour,acting as a scribe for Gwyneth Sprout, FNP.,have documented all relevant documentation on the behalf of Gwyneth Sprout, FNP,as directed by  Gwyneth Sprout, FNP while in the presence of Gwyneth Sprout, FNP.  Chief Complaint  Patient presents with   Hypertension   Subjective    HPI  Hypertension, follow-up  BP Readings from Last 3 Encounters:  03/02/22 128/82  11/08/21 (!) 154/88  08/18/21 123/87   Wt Readings from Last 3 Encounters:  03/02/22 (!) 341 lb (154.7 kg)  11/08/21 (!) 310 lb (140.6 kg)  08/18/21 (!) 317 lb 6.4 oz (144 kg)     He was last seen for hypertension 1 years ago.  BP at that visit was 159/97. Management since that visit includes HCTZ and Losartan were increased.  He reports excellent compliance with treatment. He is not having side effects.  He is following a Regular diet. He is exercising. He does not smoke.  Use of agents associated with hypertension: none.   Outside blood pressures are around 113/70. Symptoms: No chest pain No chest pressure  No palpitations No syncope  No dyspnea No orthopnea  Yes paroxysmal nocturnal dyspnea No lower extremity edema   Pertinent labs Lab Results  Component Value Date   CHOL 160 07/06/2020   HDL 30 (L) 07/06/2020   LDLCALC 94 07/06/2020   TRIG 209 (H) 07/06/2020   CHOLHDL 4.6 04/22/2019   Lab Results  Component Value Date   NA 140 07/06/2020   K 4.3 07/06/2020   CREATININE 0.74 (L) 07/06/2020   EGFR 115 07/06/2020   GLUCOSE 98 07/06/2020   TSH 3.070 07/06/2020     The 10-year ASCVD risk score (Arnett DK,  et al., 2019) is: 3.1%  ---------------------------------------------------------------------------------------------------   Medications: Outpatient Medications Prior to Visit  Medication Sig   aspirin EC 81 MG tablet Take 81 mg by mouth daily. Swallow whole.   losartan-hydrochlorothiazide (HYZAAR) 100-25 MG tablet TAKE 1 TABLET BY MOUTH DAILY. CONTAINS BOTH MEDICATIONS   No facility-administered medications prior to visit.   Review of Systems    Objective    BP 128/82 (BP Location: Left Arm, Patient Position: Sitting, Cuff Size: Large)   Pulse 73   Resp 16   Ht _0  (1.778 m)   Wt (!) 341 lb (154.7 kg)   BMI 48.93 kg/m   Physical Exam Vitals and nursing note reviewed.  Constitutional:      Appearance: Normal appearance. He is obese.  HENT:     Head: Normocephalic and atraumatic.  Eyes:     Pupils: Pupils are equal, round, and reactive to light.  Cardiovascular:     Rate and Rhythm: Normal rate and regular rhythm.     Pulses: Normal pulses.     Heart sounds: Normal heart sounds.  Pulmonary:     Effort: Pulmonary effort is normal.     Breath sounds: Normal breath sounds.  Musculoskeletal:        General: Normal range of motion.     Cervical back: Normal range of motion.  Skin:    General: Skin is warm and dry.  Capillary Refill: Capillary refill takes less than 2 seconds.     Comments: Microvascular changes noted in bilateral ankles; improved with use of HCTZ and increased water intake (128 oz/day)  Neurological:     General: No focal deficit present.     Mental Status: He is alert and oriented to person, place, and time. Mental status is at baseline.  Psychiatric:        Mood and Affect: Mood normal.        Behavior: Behavior normal.        Thought Content: Thought content normal.        Judgment: Judgment normal.    No results found for any visits on 03/02/22.  Assessment & Plan     Problem List Items Addressed This Visit       Cardiovascular and  Mediastinum   Hypertension    Chronic, well controlled Continue Hyzaar 100-25      Relevant Orders   Comprehensive Metabolic Panel (CMET)   CBC     Other   Encounter for hepatitis C screening test for low risk patient    Low risk screen Treatable, and curable. If left untreated Hep C can lead to cirrhosis and liver failure. Encourage routine testing; recommend repeat testing if risk factors change.       Relevant Orders   Hepatitis C Antibody   Encounter for screening for HIV    Low risk screen Consented; encouraged to "know your status" Recommend repeat screen if risk factors change       Relevant Orders   HIV antibody (with reflex)   High triglycerides   Relevant Orders   Lipid panel   History of cholelithiasis   Relevant Orders   Comprehensive Metabolic Panel (CMET)   Lipid panel   CBC   History of gout    Recommend uric acid levels as baseline; no active complaints       Relevant Orders   Uric acid   Morbid obesity with BMI of 45.0-49.9, adult (HCC) - Primary    Chronic, variable Body mass index is 48.93 kg/m. Continue to recommend balanced, lower carb meals. Smaller meal size, adding snacks. Choosing water as drink of choice and increasing purposeful exercise.       Relevant Orders   Comprehensive Metabolic Panel (CMET)   Lipid panel   CBC   TSH   Hemoglobin A1c   Nocturia    Recommend PSA given complaints of nocturia      Relevant Orders   PSA   Screening for diabetes mellitus    Check A1c given weight gain Continue to recommend balanced, lower carb meals. Smaller meal size, adding snacks. Choosing water as drink of choice and increasing purposeful exercise.       Relevant Orders   Hemoglobin A1c   Screening for thyroid disorder    Recommend thyroid labs given weight gain       Relevant Orders   TSH   Vitamin D insufficiency    Chronic, repeat labs Denies changes in mood, hair loss, etc      Relevant Orders   Vitamin D (25  hydroxy)   Return in about 6 months (around 09/01/2022) for annual examination.     Vonna Kotyk, FNP, have reviewed all documentation for this visit. The documentation on 03/02/22 for the exam, diagnosis, procedures, and orders are all accurate and complete.  Gwyneth Sprout, [place] [phone] (phone) [phone] (fax)  [place]

## 2022-03-02 ENCOUNTER — Encounter: Payer: Self-pay | Admitting: Family Medicine

## 2022-03-02 ENCOUNTER — Ambulatory Visit: Payer: BC Managed Care – PPO | Admitting: Family Medicine

## 2022-03-02 VITALS — BP 128/82 | HR 73 | Resp 16 | Ht 70.0 in | Wt 341.0 lb

## 2022-03-02 DIAGNOSIS — Z8719 Personal history of other diseases of the digestive system: Secondary | ICD-10-CM | POA: Diagnosis not present

## 2022-03-02 DIAGNOSIS — E781 Pure hyperglyceridemia: Secondary | ICD-10-CM

## 2022-03-02 DIAGNOSIS — R351 Nocturia: Secondary | ICD-10-CM | POA: Diagnosis not present

## 2022-03-02 DIAGNOSIS — Z6841 Body Mass Index (BMI) 40.0 and over, adult: Secondary | ICD-10-CM

## 2022-03-02 DIAGNOSIS — Z131 Encounter for screening for diabetes mellitus: Secondary | ICD-10-CM | POA: Insufficient documentation

## 2022-03-02 DIAGNOSIS — Z114 Encounter for screening for human immunodeficiency virus [HIV]: Secondary | ICD-10-CM | POA: Insufficient documentation

## 2022-03-02 DIAGNOSIS — I10 Essential (primary) hypertension: Secondary | ICD-10-CM | POA: Diagnosis not present

## 2022-03-02 DIAGNOSIS — Z1159 Encounter for screening for other viral diseases: Secondary | ICD-10-CM | POA: Diagnosis not present

## 2022-03-02 DIAGNOSIS — E559 Vitamin D deficiency, unspecified: Secondary | ICD-10-CM

## 2022-03-02 DIAGNOSIS — Z8739 Personal history of other diseases of the musculoskeletal system and connective tissue: Secondary | ICD-10-CM

## 2022-03-02 DIAGNOSIS — Z1329 Encounter for screening for other suspected endocrine disorder: Secondary | ICD-10-CM | POA: Insufficient documentation

## 2022-03-02 NOTE — Assessment & Plan Note (Signed)
Recommend uric acid levels as baseline; no active complaints

## 2022-03-02 NOTE — Assessment & Plan Note (Signed)
Recommend PSA given complaints of nocturia

## 2022-03-02 NOTE — Assessment & Plan Note (Signed)
Chronic, variable Body mass index is 48.93 kg/m. Continue to recommend balanced, lower carb meals. Smaller meal size, adding snacks. Choosing water as drink of choice and increasing purposeful exercise.

## 2022-03-02 NOTE — Assessment & Plan Note (Signed)
Recommend thyroid labs given weight gain

## 2022-03-02 NOTE — Assessment & Plan Note (Signed)
Chronic, repeat labs Denies changes in mood, hair loss, etc

## 2022-03-02 NOTE — Assessment & Plan Note (Signed)
Low risk screen ?Consented; encouraged to "know your status" ?Recommend repeat screen if risk factors change ? ?

## 2022-03-02 NOTE — Assessment & Plan Note (Signed)
Check A1c given weight gain Continue to recommend balanced, lower carb meals. Smaller meal size, adding snacks. Choosing water as drink of choice and increasing purposeful exercise.

## 2022-03-02 NOTE — Assessment & Plan Note (Signed)
Chronic, well controlled Continue Hyzaar 100-25

## 2022-03-02 NOTE — Assessment & Plan Note (Signed)
Low risk screen Treatable, and curable. If left untreated Hep C can lead to cirrhosis and liver failure. Encourage routine testing; recommend repeat testing if risk factors change.  

## 2022-03-03 ENCOUNTER — Other Ambulatory Visit: Payer: Self-pay | Admitting: Family Medicine

## 2022-03-03 DIAGNOSIS — E1169 Type 2 diabetes mellitus with other specified complication: Secondary | ICD-10-CM

## 2022-03-03 DIAGNOSIS — E782 Mixed hyperlipidemia: Secondary | ICD-10-CM

## 2022-03-03 DIAGNOSIS — E559 Vitamin D deficiency, unspecified: Secondary | ICD-10-CM

## 2022-03-03 LAB — VITAMIN D 25 HYDROXY (VIT D DEFICIENCY, FRACTURES): Vit D, 25-Hydroxy: 7.6 ng/mL — ABNORMAL LOW (ref 30.0–100.0)

## 2022-03-03 LAB — COMPREHENSIVE METABOLIC PANEL
ALT: 25 IU/L (ref 0–44)
AST: 20 IU/L (ref 0–40)
Albumin/Globulin Ratio: 1.6 (ref 1.2–2.2)
Albumin: 4.2 g/dL (ref 4.1–5.1)
Alkaline Phosphatase: 69 IU/L (ref 44–121)
BUN/Creatinine Ratio: 15 (ref 9–20)
BUN: 13 mg/dL (ref 6–24)
Bilirubin Total: 0.4 mg/dL (ref 0.0–1.2)
CO2: 26 mmol/L (ref 20–29)
Calcium: 9.3 mg/dL (ref 8.7–10.2)
Chloride: 98 mmol/L (ref 96–106)
Creatinine, Ser: 0.84 mg/dL (ref 0.76–1.27)
Globulin, Total: 2.7 g/dL (ref 1.5–4.5)
Glucose: 154 mg/dL — ABNORMAL HIGH (ref 70–99)
Potassium: 4.2 mmol/L (ref 3.5–5.2)
Sodium: 138 mmol/L (ref 134–144)
Total Protein: 6.9 g/dL (ref 6.0–8.5)
eGFR: 110 mL/min/{1.73_m2} (ref >59)

## 2022-03-03 LAB — LIPID PANEL
Chol/HDL Ratio: 8.2 ratio — ABNORMAL HIGH (ref 0.0–5.0)
Cholesterol, Total: 213 mg/dL — ABNORMAL HIGH (ref 100–199)
HDL: 26 mg/dL — ABNORMAL LOW (ref >39)
LDL Chol Calc (NIH): 113 mg/dL — ABNORMAL HIGH (ref 0–99)
Triglycerides: 427 mg/dL — ABNORMAL HIGH (ref 0–149)
VLDL Cholesterol Cal: 74 mg/dL — ABNORMAL HIGH (ref 5–40)

## 2022-03-03 LAB — TSH: TSH: 2.96 u[IU]/mL (ref 0.450–4.500)

## 2022-03-03 LAB — CBC
Hematocrit: 41.5 % (ref 37.5–51.0)
Hemoglobin: 13.8 g/dL (ref 13.0–17.7)
MCH: 27.4 pg (ref 26.6–33.0)
MCHC: 33.3 g/dL (ref 31.5–35.7)
MCV: 83 fL (ref 79–97)
Platelets: 302 10*3/uL (ref 150–450)
RBC: 5.03 x10E6/uL (ref 4.14–5.80)
RDW: 13.1 % (ref 11.6–15.4)
WBC: 7.7 10*3/uL (ref 3.4–10.8)

## 2022-03-03 LAB — HIV ANTIBODY (ROUTINE TESTING W REFLEX): HIV Screen 4th Generation wRfx: NONREACTIVE

## 2022-03-03 LAB — HEMOGLOBIN A1C
Est. average glucose Bld gHb Est-mCnc: 171 mg/dL
Hgb A1c MFr Bld: 7.6 % — ABNORMAL HIGH (ref 4.8–5.6)

## 2022-03-03 LAB — URIC ACID: Uric Acid: 6.1 mg/dL (ref 3.8–8.4)

## 2022-03-03 LAB — PSA: Prostate Specific Ag, Serum: 0.5 ng/mL (ref 0.0–4.0)

## 2022-03-03 LAB — HEPATITIS C ANTIBODY: Hep C Virus Ab: NONREACTIVE

## 2022-03-03 MED ORDER — FENOFIBRATE 145 MG PO TABS: 145.0000 mg | ORAL_TABLET | Freq: Every day | ORAL | 3 refills | Status: DC

## 2022-03-03 MED ORDER — ROSUVASTATIN CALCIUM 20 MG PO TABS: 20.0000 mg | ORAL_TABLET | Freq: Every day | ORAL | 3 refills | Status: DC

## 2022-03-03 MED ORDER — VITAMIN D (ERGOCALCIFEROL) 1.25 MG (50000 UNIT) PO CAPS: 50000.0000 [IU] | ORAL_CAPSULE | ORAL | 3 refills | Status: DC

## 2022-03-03 MED ORDER — METFORMIN HCL ER 500 MG PO TB24: 1000.0000 mg | ORAL_TABLET | Freq: Two times a day (BID) | ORAL | 3 refills | Status: DC

## 2022-03-03 NOTE — Progress Notes (Signed)
Cholesterol is elevated as well as fats, low good/HDL cholesterol, high bad/LDL cholesterol. I continue to recommend diet low in saturated fat and regular exercise - 30 min at least 5 times per week. Stroke/Heart attack risk at 6%. I would recommend medication starts to help correct.   The 10-year ASCVD risk score (Arnett DK, et al., 2019) is: 5.9%   Values used to calculate the score:     Age: 46 years     Sex: Male     Is Non-Hispanic African American: No     Diabetic: No     Tobacco smoker: No     Systolic Blood Pressure: 128 mmHg     Is BP treated: Yes     HDL Cholesterol: 26 mg/dL     Total Cholesterol: 213 mg/dL  H0Q confirms diabetes, Type 2 at 7.6%. goal of <7% for glycemic control given DM is a progressive disease. Recommend medication start.  Vit D remains very low. Recommend high dose weekly supplement.  Uric acid levels borderline. Continue to monitor diet and hydration to assist. If needed, we can add flare medications. At this time, I would not recommend daily controller medication.  Cell count and chemistry normal/stable.  Jacky Kindle, FNP  [facility] [address] [place], [place] [address] [phone] (phone) [phone] (fax) [facility]

## 2022-03-04 ENCOUNTER — Ambulatory Visit
Admission: EM | Admit: 2022-03-04 | Discharge: 2022-03-04 | Disposition: A | Discharge status: Home / Self Care | Payer: BC Managed Care – PPO | Attending: Physician Assistant | Admitting: Physician Assistant

## 2022-03-04 ENCOUNTER — Encounter: Payer: Self-pay | Admitting: Emergency Medicine

## 2022-03-04 ENCOUNTER — Ambulatory Visit (INDEPENDENT_AMBULATORY_CARE_PROVIDER_SITE_OTHER): Payer: BC Managed Care – PPO

## 2022-03-04 DIAGNOSIS — R051 Acute cough: Secondary | ICD-10-CM

## 2022-03-04 DIAGNOSIS — J101 Influenza due to other identified influenza virus with other respiratory manifestations: Secondary | ICD-10-CM

## 2022-03-04 DIAGNOSIS — Z1152 Encounter for screening for COVID-19: Secondary | ICD-10-CM | POA: Insufficient documentation

## 2022-03-04 DIAGNOSIS — E669 Obesity, unspecified: Secondary | ICD-10-CM | POA: Diagnosis not present

## 2022-03-04 DIAGNOSIS — R0981 Nasal congestion: Secondary | ICD-10-CM | POA: Insufficient documentation

## 2022-03-04 DIAGNOSIS — Z6841 Body Mass Index (BMI) 40.0 and over, adult: Secondary | ICD-10-CM | POA: Diagnosis not present

## 2022-03-04 DIAGNOSIS — Z79899 Other long term (current) drug therapy: Secondary | ICD-10-CM | POA: Diagnosis not present

## 2022-03-04 DIAGNOSIS — Z7984 Long term (current) use of oral hypoglycemic drugs: Secondary | ICD-10-CM | POA: Insufficient documentation

## 2022-03-04 DIAGNOSIS — G4733 Obstructive sleep apnea (adult) (pediatric): Secondary | ICD-10-CM | POA: Diagnosis not present

## 2022-03-04 DIAGNOSIS — E119 Type 2 diabetes mellitus without complications: Secondary | ICD-10-CM | POA: Insufficient documentation

## 2022-03-04 DIAGNOSIS — I10 Essential (primary) hypertension: Secondary | ICD-10-CM | POA: Insufficient documentation

## 2022-03-04 DIAGNOSIS — R059 Cough, unspecified: Secondary | ICD-10-CM

## 2022-03-04 HISTORY — DX: Type 2 diabetes mellitus without complications: E11.9

## 2022-03-04 LAB — RESP PANEL BY RT-PCR (FLU A&B, COVID) ARPGX2
Influenza A by PCR: POSITIVE — AB
Influenza B by PCR: NEGATIVE
SARS Coronavirus 2 by RT PCR: NEGATIVE

## 2022-03-04 MED ORDER — OSELTAMIVIR PHOSPHATE 75 MG PO CAPS: 75.0000 mg | ORAL_CAPSULE | Freq: Two times a day (BID) | ORAL | 0 refills | Status: AC

## 2022-03-04 MED ORDER — OSELTAMIVIR PHOSPHATE 75 MG PO CAPS: 75.0000 mg | ORAL_CAPSULE | Freq: Two times a day (BID) | ORAL | 0 refills | Status: DC

## 2022-03-04 MED ORDER — PROMETHAZINE-DM 6.25-15 MG/5ML PO SYRP: 5.0000 mL | ORAL_SOLUTION | Freq: Four times a day (QID) | ORAL | 0 refills | Status: DC | PRN

## 2022-03-04 NOTE — Discharge Instructions (Signed)
-  You are positive for the flu.  Chest x-ray is normal. -Sent Tamiflu and cough medicine the pharmacy.  Increase rest and fluids. - If you have any uncontrolled fever, wheeze or shortness of breath, please return to our department for reevaluation or go to the ER.

## 2022-03-04 NOTE — ED Provider Notes (Signed)
[facility]    CSN: [id number] Arrival date & time: 03/04/22  1343      History   Chief Complaint Chief Complaint  Patient presents with   Cough    HPI Steve [name] is a 46 y.o. male presenting for cough and congestion for the past 2 days.  Symptoms have been within the past 48 hours.  He denies any fever.  Reports fatigue and body aches.  No sore throat, shortness of breath, vomiting or diarrhea.  No sick contacts.  Reports he did have COVID 3 weeks ago.  Symptoms got better pretty quickly after initial diagnosis.  He has not had any lingering symptoms.  Has been taking over-the-counter DayQuil/NyQuil for symptoms with says it does not seem very strong.  His medical history significant for diabetes, hypertension, obstructive sleep apnea and obesity.  HPI  Past Medical History:  Diagnosis Date   Diabetes mellitus without complication (HCC)    Gall bladder inflammation    Hypertension    Obstructive sleep apnea     Patient Active Problem List   Diagnosis Date Noted   Morbid obesity with BMI of 45.0-49.9, adult (HCC) 03/02/2022   Encounter for screening for HIV 03/02/2022   Encounter for hepatitis C screening test for low risk patient 03/02/2022   Nocturia 03/02/2022   History of gout 03/02/2022   Screening for diabetes mellitus 03/02/2022   Screening for thyroid disorder 03/02/2022   Chronic intermittent hypoxia with obstructive sleep apnea 11/22/2021   OSA (obstructive sleep apnea) 09/17/2021   Intolerance of continuous positive airway pressure (CPAP) ventilation 09/17/2021   Severe obstructive sleep apnea-hypopnea syndrome 01/04/2021   Excessive daytime sleepiness 01/04/2021   Class 3 severe obesity due to excess calories without serious comorbidity with body mass index (BMI) of 50.0 to 59.9 in adult [facility]) 01/04/2021   Anxiety about health 01/04/2021   Vitamin D insufficiency 03/18/2020   Calculus of gallbladder without cholecystitis without  obstruction 06/20/2019   Atypical chest pain 05/28/2019   Shortness of breath 05/28/2019   Abnormal EKG 05/28/2019   High triglycerides 05/23/2019   History of cholelithiasis 05/23/2019   Hypertension 05/23/2019   Morbid obesity (HCC) 04/22/2019   Use of cannabis oil 04/22/2019   Fatigue 04/22/2019   Encounter for annual physical exam 04/22/2019   RUQ abdominal pain 04/22/2019   Paresthesia of both hands 04/22/2019   Bilateral wrist pain 04/22/2019   Morbid obesity with BMI of 50.0-59.9, adult (HCC) 04/22/2019   History of chest pain 04/22/2019   Anxiety 04/22/2019   Family history of coronary artery disease 04/22/2019    Past Surgical History:  Procedure Laterality Date   ADENOIDECTOMY         Home Medications    Prior to Admission medications   Medication Sig Start Date End Date Taking? Authorizing Provider  aspirin EC 81 MG tablet Take 81 mg by mouth daily. Swallow whole.   Yes [provider]  fenofibrate (TRICOR) 145 MG tablet Take 1 tablet (145 mg total) by mouth daily. 03/03/22  Yes Merita Norton T, FNP  losartan-hydrochlorothiazide (HYZAAR) 100-25 MG tablet TAKE 1 TABLET BY MOUTH DAILY. CONTAINS BOTH MEDICATIONS 03/01/22  Yes Merita Norton T, FNP  metFORMIN (GLUCOPHAGE-XR) 500 MG 24 hr tablet Take 2 tablets (1,000 mg total) by mouth 2 (two) times daily with a meal. 03/03/22  Yes Jacky Kindle, FNP  rosuvastatin (CRESTOR) 20 MG tablet Take 1 tablet (20 mg total) by mouth daily. 03/03/22  Yes Merita Norton  T, FNP  Vitamin D, Ergocalciferol, (DRISDOL) 1.25 MG (50000 UNIT) CAPS capsule Take 1 capsule (50,000 Units total) by mouth every 7 (seven) days. 03/03/22  Yes Gwyneth Sprout, FNP  oseltamivir (TAMIFLU) 75 MG capsule Take 1 capsule (75 mg total) by mouth every 12 (twelve) hours for 5 days. 03/04/22 03/09/22  Danton Clap, PA-C  promethazine-dextromethorphan (PROMETHAZINE-DM) 6.25-15 MG/5ML syrup Take 5 mLs by mouth 4 (four) times daily as needed. 03/04/22   Danton Clap, PA-C    Family History Family History  Problem Relation Age of Onset   Diabetes Mother    Heart attack Father 68   Heart attack Maternal Grandfather    Diabetes Maternal Grandfather     Social History Social History   Tobacco Use   Smoking status: Former    Packs/day: 0.25    Years: 0.50    Total pack years: 0.13    Types: Cigarettes    Quit date: 07/29/1999    Years since quitting: 22.6   Smokeless tobacco: Former    Types: Snuff    Quit date: 08/2018  Vaping Use   Vaping Use: Never used  Substance Use Topics   Alcohol use: Not Currently    Alcohol/week: 2.0 - 3.0 standard drinks of alcohol    Types: 2 - 3 Cans of beer per week   Drug use: No     Allergies   Patient has no known allergies.   Review of Systems Review of Systems  Constitutional:  Positive for fatigue. Negative for fever.  HENT:  Positive for congestion and rhinorrhea. Negative for sinus pressure, sinus pain and sore throat.   Respiratory:  Positive for cough. Negative for shortness of breath.   Gastrointestinal:  Negative for abdominal pain, diarrhea, nausea and vomiting.  Musculoskeletal:  Positive for myalgias.  Neurological:  Negative for weakness, light-headedness and headaches.  Hematological:  Negative for adenopathy.     Physical Exam Triage Vital Signs ED Triage Vitals  Enc Vitals Group     BP      Pulse      Resp      Temp      Temp src      SpO2      Weight      Height      Head Circumference      Peak Flow      Pain Score      Pain Loc      Pain Edu?      Excl. in [place]?    No data found.  Updated Vital Signs BP 129/68 (BP Location: Left Arm)   Pulse 71   Temp 99.1 F (37.3 C) (Oral)   Resp 15   Ht 5\' 10"  (1.778 m)   Wt (!) 342 lb (155.1 kg)   SpO2 96%   BMI 49.07 kg/m    Physical Exam Vitals and nursing note reviewed.  Constitutional:      General: He is not in acute distress.    Appearance: Normal appearance. He is well-developed. He is not  ill-appearing.  HENT:     Head: Normocephalic and atraumatic.     Nose: Congestion present.     Mouth/Throat:     Mouth: Mucous membranes are moist.     Pharynx: Oropharynx is clear. No posterior oropharyngeal erythema.  Eyes:     General: No scleral icterus.    Conjunctiva/sclera: Conjunctivae normal.  Cardiovascular:     Rate and Rhythm: Normal rate and regular  rhythm.     Heart sounds: Normal heart sounds.  Pulmonary:     Effort: Pulmonary effort is normal. No respiratory distress.     Breath sounds: Normal breath sounds.  Musculoskeletal:     Cervical back: Neck supple.  Skin:    General: Skin is warm and dry.     Capillary Refill: Capillary refill takes less than 2 seconds.  Neurological:     General: No focal deficit present.     Mental Status: He is alert. Mental status is at baseline.     Motor: No weakness.     Gait: Gait normal.  Psychiatric:        Mood and Affect: Mood normal.        Behavior: Behavior normal.      UC Treatments / Results  Labs (all labs ordered are listed, but only abnormal results are displayed) Labs Reviewed  RESP PANEL BY RT-PCR (FLU A&B, COVID) ARPGX2 - Abnormal; Notable for the following components:      Result Value   Influenza A by PCR POSITIVE (*)    All other components within normal limits    EKG   Radiology DG Chest 2 View  Result Date: 03/04/2022 CLINICAL DATA:  Cough. EXAM: CHEST - 2 VIEW COMPARISON:  May 08, 2020. FINDINGS: The heart size and mediastinal contours are within normal limits. Both lungs are clear. The visualized skeletal structures are unremarkable. IMPRESSION: No active cardiopulmonary disease. Electronically Signed   By: Marijo Conception M.D.   On: 03/04/2022 15:17    Procedures Procedures (including critical care time)  Medications Ordered in UC Medications - No data to display  Initial Impression / Assessment and Plan / UC Course  I have reviewed the triage vital signs and the nursing  notes.  Pertinent labs & imaging results that were available during my care of the patient were reviewed by me and considered in my medical decision making (see chart for details).   46 year old male presents for cough and congestion x 2 days.  Also reporting fatigue and body aches.  History of COVID 3 weeks ago.  Vitals are normal and stable and patient is overall well-appearing.  In no acute distress.  On exam he has nasal congestion.  Throat is clear.  Chest clear auscultation heart regular rate and rhythm.  Respiratory panel obtained by nursing staff. Chest x-ray ordered to assess for possible underlying pneumonia given that he had COVID a few weeks ago.  Respiratory panel is positive for influenza A.  Chest x-ray is normal.  Discussed all results with patient.  Will treat at this time with Tamiflu since he is within the window.  Also sent Promethazine DM and encouraged plenty rest and fluids.  Work note provided.  Follow-up sooner.   Final Clinical Impressions(s) / UC Diagnoses   Final diagnoses:  Influenza A  Acute cough  Nasal congestion     Discharge Instructions      -You are positive for the flu.  Chest x-ray is normal. -Sent Tamiflu and cough medicine the pharmacy.  Increase rest and fluids. - If you have any uncontrolled fever, wheeze or shortness of breath, please return to our department for reevaluation or go to the ER.     ED Prescriptions     Medication Sig Dispense Auth. Provider   promethazine-dextromethorphan (PROMETHAZINE-DM) 6.25-15 MG/5ML syrup  (Status: Discontinued) Take 5 mLs by mouth 4 (four) times daily as needed. 118 mL Laurene Footman B, PA-C   oseltamivir (TAMIFLU)  75 MG capsule  (Status: Discontinued) Take 1 capsule (75 mg total) by mouth every 12 (twelve) hours for 5 days. 10 capsule Danton Clap, PA-C   oseltamivir (TAMIFLU) 75 MG capsule Take 1 capsule (75 mg total) by mouth every 12 (twelve) hours for 5 days. 10 capsule Danton Clap, PA-C    promethazine-dextromethorphan (PROMETHAZINE-DM) 6.25-15 MG/5ML syrup Take 5 mLs by mouth 4 (four) times daily as needed. 118 mL Danton Clap, PA-C      PDMP not reviewed this encounter.   Danton Clap, PA-C 03/04/22 [phone]

## 2022-03-04 NOTE — ED Triage Notes (Signed)
Patient c/o cough and chest congestion that started 2 days ago.  Patient unsure of fevers.

## 2022-03-07 ENCOUNTER — Telehealth: Payer: Self-pay

## 2022-03-07 NOTE — Telephone Encounter (Signed)
Copied from CRM [id number]. Topic: General - Inquiry >> Mar 07, 2022 11:45 AM Haroldine Laws wrote: Pt called asking if Merita Norton could call him back regarding a medication he is taking.  He said he really wanted to speak to her..  He said he has multiple questions for her.  CB@  [phone]

## 2022-03-22 DIAGNOSIS — R059 Cough, unspecified: Secondary | ICD-10-CM | POA: Diagnosis not present

## 2022-03-22 DIAGNOSIS — R058 Other specified cough: Secondary | ICD-10-CM | POA: Diagnosis not present

## 2022-03-22 DIAGNOSIS — R051 Acute cough: Secondary | ICD-10-CM | POA: Diagnosis not present

## 2022-03-28 DIAGNOSIS — G4733 Obstructive sleep apnea (adult) (pediatric): Secondary | ICD-10-CM | POA: Diagnosis not present

## 2022-04-20 ENCOUNTER — Ambulatory Visit: Payer: BC Managed Care – PPO | Admitting: Adult Health

## 2022-04-28 DIAGNOSIS — G4733 Obstructive sleep apnea (adult) (pediatric): Secondary | ICD-10-CM | POA: Diagnosis not present

## 2022-05-31 ENCOUNTER — Ambulatory Visit: Payer: BC Managed Care – PPO | Admitting: Family Medicine

## 2022-05-31 NOTE — Progress Notes (Deleted)
      Established patient visit   Patient: Steve [name]   DOB: Jan 23, 1976   47 y.o. Male  MRN: [id number] Visit Date: 06/01/2022  Today's healthcare provider: Gwyneth Sprout, FNP   No chief complaint on file.  Subjective    HPI  Diabetes Mellitus Type [name], Follow-up  Lab Results  Component Value Date   HGBA1C 7.6 (H) 03/02/2022   Wt Readings from Last 3 Encounters:  03/04/22 (!) 342 lb (155.1 kg)  03/02/22 (!) 341 lb (154.7 kg)  11/08/21 (!) 310 lb (140.6 kg)   Last seen for diabetes 3 months ago.  Management since then includes start Metformin. He reports {excellent/good/fair/poor:19665} compliance with treatment. He {is/is not:21021397} having side effects. {document side effects if present:1} Symptoms: {Yes/No:20286} fatigue {Yes/No:20286} foot ulcerations  {Yes/No:20286} appetite changes {Yes/No:20286} nausea  {Yes/No:20286} paresthesia of the feet  {Yes/No:20286} polydipsia  {Yes/No:20286} polyuria {Yes/No:20286} visual disturbances   {Yes/No:20286} vomiting     Home blood sugar records: {diabetes glucometry results:16657}  Episodes of hypoglycemia? {Yes/No:20286} {enter symptoms and frequency of symptoms if yes:1}   Current insulin regiment: {enter 'none' or type of insulin and number of units taken with each dose of each insulin formulation that the patient is taking:1} Most Recent Eye Exam: ***  Pertinent Labs: Lab Results  Component Value Date   CHOL 213 (H) 03/02/2022   HDL 26 (L) 03/02/2022   LDLCALC 113 (H) 03/02/2022   TRIG 427 (H) 03/02/2022   CHOLHDL 8.2 (H) 03/02/2022   Lab Results  Component Value Date   NA 138 03/02/2022   K 4.2 03/02/2022   CREATININE 0.84 03/02/2022   EGFR 110 03/02/2022     ---------------------------------------------------------------------------------------------------   Medications: Outpatient Medications Prior to Visit  Medication Sig   aspirin EC 81 MG tablet Take 81 mg by mouth daily. Swallow whole.    fenofibrate (TRICOR) 145 MG tablet Take 1 tablet (145 mg total) by mouth daily.   losartan-hydrochlorothiazide (HYZAAR) 100-25 MG tablet TAKE 1 TABLET BY MOUTH DAILY. CONTAINS BOTH MEDICATIONS   metFORMIN (GLUCOPHAGE-XR) 500 MG 24 hr tablet Take 2 tablets (1,000 mg total) by mouth 2 (two) times daily with a meal.   promethazine-dextromethorphan (PROMETHAZINE-DM) 6.25-15 MG/5ML syrup Take 5 mLs by mouth 4 (four) times daily as needed.   rosuvastatin (CRESTOR) 20 MG tablet Take 1 tablet (20 mg total) by mouth daily.   Vitamin D, Ergocalciferol, (DRISDOL) 1.25 MG (50000 UNIT) CAPS capsule Take 1 capsule (50,000 Units total) by mouth every 7 (seven) days.   No facility-administered medications prior to visit.    Review of Systems  {Labs  Heme  Chem  Endocrine  Serology  Results Review (optional):23779}   Objective    There were no vitals taken for this visit. {Show previous vital signs (optional):23777}  Physical Exam  ***  No results found for any visits on 06/01/22.  Assessment & Plan     ***  No follow-ups on file.      {provider attestation***:1}   Gwyneth Sprout, [place] [phone] (phone) [phone] (fax)  [place]

## 2022-06-01 ENCOUNTER — Ambulatory Visit: Payer: BC Managed Care – PPO | Admitting: Family Medicine

## 2022-06-01 DIAGNOSIS — E1169 Type 2 diabetes mellitus with other specified complication: Secondary | ICD-10-CM

## 2022-06-02 ENCOUNTER — Encounter: Payer: Self-pay | Admitting: Family Medicine

## 2022-06-02 ENCOUNTER — Ambulatory Visit (INDEPENDENT_AMBULATORY_CARE_PROVIDER_SITE_OTHER): Payer: BC Managed Care – PPO | Admitting: Family Medicine

## 2022-06-02 DIAGNOSIS — E785 Hyperlipidemia, unspecified: Secondary | ICD-10-CM

## 2022-06-02 DIAGNOSIS — Z23 Encounter for immunization: Secondary | ICD-10-CM

## 2022-06-02 DIAGNOSIS — I152 Hypertension secondary to endocrine disorders: Secondary | ICD-10-CM

## 2022-06-02 DIAGNOSIS — E559 Vitamin D deficiency, unspecified: Secondary | ICD-10-CM | POA: Insufficient documentation

## 2022-06-02 DIAGNOSIS — E1169 Type 2 diabetes mellitus with other specified complication: Secondary | ICD-10-CM | POA: Insufficient documentation

## 2022-06-02 DIAGNOSIS — E1159 Type 2 diabetes mellitus with other circulatory complications: Secondary | ICD-10-CM

## 2022-06-02 DIAGNOSIS — Z6841 Body Mass Index (BMI) 40.0 and over, adult: Secondary | ICD-10-CM

## 2022-06-02 DIAGNOSIS — I10 Essential (primary) hypertension: Secondary | ICD-10-CM | POA: Diagnosis not present

## 2022-06-02 MED ORDER — FENOFIBRATE 145 MG PO TABS: 145.0000 mg | ORAL_TABLET | Freq: Every day | ORAL | 3 refills | Status: DC

## 2022-06-02 MED ORDER — METFORMIN HCL ER 500 MG PO TB24: 1000.0000 mg | ORAL_TABLET | Freq: Two times a day (BID) | ORAL | 3 refills | Status: DC

## 2022-06-02 MED ORDER — VITAMIN D (ERGOCALCIFEROL) 1.25 MG (50000 UNIT) PO CAPS: 50000.0000 [IU] | ORAL_CAPSULE | ORAL | 3 refills | Status: AC

## 2022-06-02 MED ORDER — ROSUVASTATIN CALCIUM 20 MG PO TABS: 20.0000 mg | ORAL_TABLET | Freq: Every day | ORAL | 3 refills | Status: DC

## 2022-06-02 MED ORDER — LOSARTAN POTASSIUM-HCTZ 100-25 MG PO TABS: 1.0000 | ORAL_TABLET | Freq: Every day | ORAL | 1 refills | Status: DC

## 2022-06-02 NOTE — Progress Notes (Signed)
Established patient visit  Patient: Steve [name]   DOB: 1975/08/12   47 y.o. Male  MRN: [id number] Visit Date: 06/02/2022  Today's healthcare provider: Gwyneth Sprout, FNP  Re Introduced to nurse practitioner role and practice setting.  All questions answered.  Discussed provider/patient relationship and expectations.  Subjective    HPI HPI   Follow-up Last edited by Elta Guadeloupe, CMA on 06/02/2022  1:22 PM.      Hypertension, follow-up  BP Readings from Last 3 Encounters:  06/02/22 (!) 150/99  03/04/22 129/68  03/02/22 128/82   Wt Readings from Last 3 Encounters:  06/02/22 (!) 355 lb (161 kg)  03/04/22 (!) 342 lb (155.1 kg)  03/02/22 (!) 341 lb (154.7 kg)     He was last seen for hypertension 3 months ago.  BP at that visit was 128/82. Management since that visit includes continue Hyzaar  He reports poor compliance with treatment. He is not having side effects.  He is following a Regular diet. He is not exercising. He does not smoke.  Use of agents associated with hypertension: none.   Outside blood pressures are not being checked. Symptoms: No chest pain No chest pressure  No palpitations No syncope  No dyspnea No orthopnea  Yes paroxysmal nocturnal dyspnea No lower extremity edema   Pertinent labs Lab Results  Component Value Date   CHOL 213 (H) 03/02/2022   HDL 26 (L) 03/02/2022   LDLCALC 113 (H) 03/02/2022   TRIG 427 (H) 03/02/2022   CHOLHDL 8.2 (H) 03/02/2022   Lab Results  Component Value Date   NA 138 03/02/2022   K 4.2 03/02/2022   CREATININE 0.84 03/02/2022   EGFR 110 03/02/2022   GLUCOSE 154 (H) 03/02/2022   TSH 2.960 03/02/2022     The 10-year ASCVD risk score (Arnett DK, et al., 2019) is: 14.5%  --------------------------------------------------------------------------------------------------- Diabetes Mellitus Type [name], Follow-up  Lab Results  Component Value Date   HGBA1C 7.6 (H) 03/02/2022   Wt Readings from Last 3  Encounters:  06/02/22 (!) 355 lb (161 kg)  03/04/22 (!) 342 lb (155.1 kg)  03/02/22 (!) 341 lb (154.7 kg)   Last seen for diabetes 4 months ago.  Management since then includes none; was started on metformin 1000 mg BID. He reports poor compliance with treatment. He is not having side effects.  Symptoms: Yes fatigue No foot ulcerations  Yes appetite changes No nausea  No paresthesia of the feet  No polydipsia  No polyuria No visual disturbances   No vomiting     Home blood sugar records: not being checked  Episodes of hypoglycemia? Yes    Current insulin regiment: none Most Recent Eye Exam: UTD per pt report Current exercise: none Current diet habits: in general, an "unhealthy" diet  Pertinent Labs: Lab Results  Component Value Date   CHOL 213 (H) 03/02/2022   HDL 26 (L) 03/02/2022   LDLCALC 113 (H) 03/02/2022   TRIG 427 (H) 03/02/2022   CHOLHDL 8.2 (H) 03/02/2022   Lab Results  Component Value Date   NA 138 03/02/2022   K 4.2 03/02/2022   CREATININE 0.84 03/02/2022   EGFR 110 03/02/2022     --------------------------------------------------------------------------------------------------- Lipid/Cholesterol, Follow-up  Last lipid panel Other pertinent labs  Lab Results  Component Value Date   CHOL 213 (H) 03/02/2022   HDL 26 (L) 03/02/2022   LDLCALC 113 (H) 03/02/2022   TRIG 427 (H) 03/02/2022   CHOLHDL 8.2 (H) 03/02/2022   Lab  Results  Component Value Date   ALT 25 03/02/2022   AST 20 03/02/2022   PLT 302 03/02/2022   TSH 2.960 03/02/2022     He was last seen for this 4 months ago.  Management since that visit includes starting tricor 145 and crestor 20.  He reports poor compliance with treatment. He is not having side effects.   Symptoms: No chest pain No chest pressure/discomfort  Yes dyspnea No lower extremity edema  No numbness or tingling of extremity No orthopnea  No palpitations Yes paroxysmal nocturnal dyspnea  No speech difficulty No  syncope   Current diet: in general, an "unhealthy" diet Current exercise: none  The 10-year ASCVD risk score (Arnett DK, et al., 2019) is: 14.5%  Obesity: Patient complains of obesity. Patient cites health as reasons for wanting to lose weight.  Obesity History Amount of time at present weight: 355 lb.   Current Exercise Habits none  Current Eating Habits Number of regular meals per day: 2 Number of snacking episodes per day: several Who shops for food? patient Who prepares food? patient Who eats with patient? patient Binge behavior?: no Purge behavior? no Anorexic behavior? 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 no  Other Potential Contributing Factors Use of alcohol: average 0 drinks/week Use of medications that may cause weight gain none History of past abuse? none Psych History: obesity and sleep disturbance Comorbidities: diabetes mellitus, dyslipidemias, and hypertension  ---------------------------------------------------------------------------------------------------  Medications: Outpatient Medications Prior to Visit  Medication Sig   aspirin EC 81 MG tablet Take 81 mg by mouth daily. Swallow whole. (Patient not taking: Reported on 06/02/2022)   [DISCONTINUED] fenofibrate (TRICOR) 145 MG tablet Take 1 tablet (145 mg total) by mouth daily. (Patient not taking: Reported on 06/02/2022)   [DISCONTINUED] losartan-hydrochlorothiazide (HYZAAR) 100-25 MG tablet TAKE 1 TABLET BY MOUTH DAILY. CONTAINS BOTH MEDICATIONS (Patient not taking: Reported on 06/02/2022)   [DISCONTINUED] metFORMIN (GLUCOPHAGE-XR) 500 MG 24 hr tablet Take 2 tablets (1,000 mg total) by mouth 2 (two) times daily with a meal. (Patient not taking: Reported on 06/02/2022)   [DISCONTINUED] promethazine-dextromethorphan (PROMETHAZINE-DM) 6.25-15 MG/5ML syrup Take 5 mLs by mouth 4 (four) times daily as needed.   [DISCONTINUED] rosuvastatin (CRESTOR) 20 MG tablet Take  1 tablet (20 mg total) by mouth daily. (Patient not taking: Reported on 06/02/2022)   [DISCONTINUED] Vitamin D, Ergocalciferol, (DRISDOL) 1.25 MG (50000 UNIT) CAPS capsule Take 1 capsule (50,000 Units total) by mouth every 7 (seven) days. (Patient not taking: Reported on 06/02/2022)   No facility-administered medications prior to visit.    Review of Systems  Last CBC Lab Results  Component Value Date   WBC 7.7 03/02/2022   HGB 13.8 03/02/2022   HCT 41.5 03/02/2022   MCV 83 03/02/2022   MCH 27.4 03/02/2022   RDW 13.1 03/02/2022   PLT 302 [id number]   Last metabolic panel Lab Results  Component Value Date   GLUCOSE 154 (H) 03/02/2022   NA 138 03/02/2022   K 4.2 03/02/2022   CL 98 03/02/2022   CO2 26 03/02/2022   BUN 13 03/02/2022   CREATININE 0.84 03/02/2022   EGFR 110 03/02/2022   CALCIUM 9.3 03/02/2022   PROT 6.9 03/02/2022   ALBUMIN 4.2 03/02/2022   LABGLOB 2.7 03/02/2022   AGRATIO 1.6 03/02/2022   BILITOT 0.4 03/02/2022   ALKPHOS 69 03/02/2022   AST 20 03/02/2022   ALT 25 03/02/2022   ANIONGAP 10 05/08/2020   Last lipids  Lab Results  Component Value Date   CHOL 213 (H) 03/02/2022   HDL 26 (L) 03/02/2022   LDLCALC 113 (H) 03/02/2022   TRIG 427 (H) 03/02/2022   CHOLHDL 8.2 (H) 03/02/2022   Last hemoglobin A1c Lab Results  Component Value Date   HGBA1C 7.6 (H) 03/02/2022   Last thyroid functions Lab Results  Component Value Date   TSH 2.960 03/02/2022       Objective    BP (!) 150/99 (BP Location: Left Arm, Patient Position: Sitting, Cuff Size: Large)   Pulse 63   Ht '5\' 10"'$  (1.778 m)   Wt (!) 355 lb (161 kg)   SpO2 99%   BMI 50.94 kg/m   BP Readings from Last 3 Encounters:  06/02/22 (!) 150/99  03/04/22 129/68  03/02/22 128/82   Wt Readings from Last 3 Encounters:  06/02/22 (!) 355 lb (161 kg)  03/04/22 (!) 342 lb (155.1 kg)  03/02/22 (!) 341 lb (154.7 kg)   SpO2 Readings from Last 3 Encounters:  06/02/22 99%  03/04/22 96%  11/08/21 98%    Physical Exam Vitals and nursing note reviewed.  Constitutional:      Appearance: Normal appearance. He is obese.  HENT:     Head: Normocephalic and atraumatic.  Cardiovascular:     Rate and Rhythm: Normal rate and regular rhythm.     Pulses: Normal pulses.     Heart sounds: Normal heart sounds.  Pulmonary:     Effort: Pulmonary effort is normal.     Breath sounds: Normal breath sounds.  Musculoskeletal:        General: Normal range of motion.     Cervical back: Normal range of motion.  Skin:    General: Skin is warm and dry.     Capillary Refill: Capillary refill takes less than 2 seconds.  Neurological:     General: No focal deficit present.     Mental Status: He is alert and oriented to person, place, and time. Mental status is at baseline.  Psychiatric:        Mood and Affect: Mood normal.        Behavior: Behavior normal.        Thought Content: Thought content normal.        Judgment: Judgment normal.     No results found for any visits on 06/02/22.  Assessment & Plan     Problem List Items Addressed This Visit       Cardiovascular and Mediastinum   Hypertension associated with diabetes ([place])    Chronic, uncontrolled Goal 130/80 with pre-DM Restart Hyzaar 100-25; 1 month f/u      Relevant Medications   fenofibrate (TRICOR) 145 MG tablet   losartan-hydrochlorothiazide (HYZAAR) 100-25 MG tablet   metFORMIN (GLUCOPHAGE-XR) 500 MG 24 hr tablet   rosuvastatin (CRESTOR) 20 MG tablet     Endocrine   Hyperlipidemia associated with type 2 diabetes mellitus (HCC)    Chronic, previously elevated Notes inconsistencies with Tricor 145 and Crestor 20 Repeat LP      Relevant Medications   fenofibrate (TRICOR) 145 MG tablet   losartan-hydrochlorothiazide (HYZAAR) 100-25 MG tablet   metFORMIN (GLUCOPHAGE-XR) 500 MG 24 hr tablet   rosuvastatin (CRESTOR) 20 MG tablet   Other Relevant Orders   Lipid panel   Type 2 diabetes mellitus with morbid obesity (HCC) -  Primary    Chronic, worsening Body mass index is 50.94 kg/m. Repeat A1c      Relevant Medications   losartan-hydrochlorothiazide (  HYZAAR) 100-25 MG tablet   metFORMIN (GLUCOPHAGE-XR) 500 MG 24 hr tablet   rosuvastatin (CRESTOR) 20 MG tablet   Other Relevant Orders   Hemoglobin A1c   Urine Microalbumin w/creat. ratio     Other   Avitaminosis D    Chronic, previously low Was started on supplement Pt notes inconsistent use with supplements Repeat Vit D      Relevant Medications   fenofibrate (TRICOR) 145 MG tablet   losartan-hydrochlorothiazide (HYZAAR) 100-25 MG tablet   metFORMIN (GLUCOPHAGE-XR) 500 MG 24 hr tablet   rosuvastatin (CRESTOR) 20 MG tablet   Vitamin D, Ergocalciferol, (DRISDOL) 1.25 MG (50000 UNIT) CAPS capsule   Other Relevant Orders   Flu Vaccine QUAD 31moIM (Fluarix, Fluzone & Alfiuria Quad PF) (Completed)   CBC with Differential/Platelet   Comprehensive Metabolic Panel (CMET)   Hemoglobin A1c   Lipid panel   Vitamin D (25 hydroxy)   Urine Microalbumin w/creat. ratio   Morbid obesity with BMI of 50.0-59.9, adult (HCC)    Chronic, worsening Weight gain noted in previous 3 months Continue to recommend balanced, lower carb meals. Smaller meal size, adding snacks. Choosing water as drink of choice and increasing purposeful exercise.       Relevant Medications   metFORMIN (GLUCOPHAGE-XR) 500 MG 24 hr tablet   Need for immunization against influenza   Relevant Orders   Flu Vaccine QUAD 675moM (Fluarix, Fluzone & Alfiuria Quad PF) (Completed)   Return in about 4 weeks (around 06/30/2022) for HTN management, chonic disease management, T2DM management.     I,Vonna KotykFNP, have reviewed all documentation for this visit. The documentation on 06/02/22 for the exam, diagnosis, procedures, and orders are all accurate and complete.  ElGwyneth [phone]) [phone])  [place]

## 2022-06-02 NOTE — Assessment & Plan Note (Signed)
Chronic, worsening Weight gain noted in previous 3 months Continue to recommend balanced, lower carb meals. Smaller meal size, adding snacks. Choosing water as drink of choice and increasing purposeful exercise.

## 2022-06-02 NOTE — Assessment & Plan Note (Signed)
Chronic, worsening Body mass index is 50.94 kg/m. Repeat A1c

## 2022-06-02 NOTE — Assessment & Plan Note (Signed)
Chronic, uncontrolled Goal 130/80 with pre-DM Restart Hyzaar 100-25; 1 month f/u

## 2022-06-02 NOTE — Assessment & Plan Note (Signed)
Chronic, previously low Was started on supplement Pt notes inconsistent use with supplements Repeat Vit D

## 2022-06-02 NOTE — Assessment & Plan Note (Signed)
Chronic, previously elevated Notes inconsistencies with Tricor 145 and Crestor 20 Repeat LP

## 2022-06-03 LAB — COMPREHENSIVE METABOLIC PANEL
ALT: 27 IU/L (ref 0–44)
AST: 20 IU/L (ref 0–40)
Albumin/Globulin Ratio: 1.5 (ref 1.2–2.2)
Albumin: 4.1 g/dL (ref 4.1–5.1)
Alkaline Phosphatase: 71 IU/L (ref 44–121)
BUN/Creatinine Ratio: 15 (ref 9–20)
BUN: 12 mg/dL (ref 6–24)
Bilirubin Total: 0.4 mg/dL (ref 0.0–1.2)
CO2: 23 mmol/L (ref 20–29)
Calcium: 9.2 mg/dL (ref 8.7–10.2)
Chloride: 104 mmol/L (ref 96–106)
Creatinine, Ser: 0.81 mg/dL (ref 0.76–1.27)
Globulin, Total: 2.7 g/dL (ref 1.5–4.5)
Glucose: 97 mg/dL (ref 70–99)
Potassium: 4.3 mmol/L (ref 3.5–5.2)
Sodium: 143 mmol/L (ref 134–144)
Total Protein: 6.8 g/dL (ref 6.0–8.5)
eGFR: 111 mL/min/{1.73_m2} (ref >59)

## 2022-06-03 LAB — LIPID PANEL
Chol/HDL Ratio: 4.4 ratio (ref 0.0–5.0)
Cholesterol, Total: 119 mg/dL (ref 100–199)
HDL: 27 mg/dL — ABNORMAL LOW (ref >39)
LDL Chol Calc (NIH): 55 mg/dL (ref 0–99)
Triglycerides: 225 mg/dL — ABNORMAL HIGH (ref 0–149)
VLDL Cholesterol Cal: 37 mg/dL (ref 5–40)

## 2022-06-03 LAB — CBC WITH DIFFERENTIAL/PLATELET
Basophils Absolute: 0 10*3/uL (ref 0.0–0.2)
Basos: 1 %
EOS (ABSOLUTE): 0.1 10*3/uL (ref 0.0–0.4)
Eos: 1 %
Hematocrit: 39.4 % (ref 37.5–51.0)
Hemoglobin: 13 g/dL (ref 13.0–17.7)
Immature Grans (Abs): 0 10*3/uL (ref 0.0–0.1)
Immature Granulocytes: 1 %
Lymphocytes Absolute: 2.1 10*3/uL (ref 0.7–3.1)
Lymphs: 24 %
MCH: 27.8 pg (ref 26.6–33.0)
MCHC: 33 g/dL (ref 31.5–35.7)
MCV: 84 fL (ref 79–97)
Monocytes Absolute: 0.6 10*3/uL (ref 0.1–0.9)
Monocytes: 7 %
Neutrophils Absolute: 5.9 10*3/uL (ref 1.4–7.0)
Neutrophils: 66 %
Platelets: 310 10*3/uL (ref 150–450)
RBC: 4.67 x10E6/uL (ref 4.14–5.80)
RDW: 13 % (ref 11.6–15.4)
WBC: 8.8 10*3/uL (ref 3.4–10.8)

## 2022-06-03 LAB — VITAMIN D 25 HYDROXY (VIT D DEFICIENCY, FRACTURES): Vit D, 25-Hydroxy: 19.2 ng/mL — ABNORMAL LOW (ref 30.0–100.0)

## 2022-06-03 LAB — HEMOGLOBIN A1C
Est. average glucose Bld gHb Est-mCnc: 148 mg/dL
Hgb A1c MFr Bld: 6.8 % — ABNORMAL HIGH (ref 4.8–5.6)

## 2022-06-03 NOTE — Progress Notes (Signed)
Despite some inconsistencies in medication use - A1c is down to 6.8%; at goal of <7% - Cholesterol is also at goal of LDL <70 - Vit D is improved; however, continued supplementation needed - Cell count and chemistry stable - Urine pending

## 2022-06-05 LAB — MICROALBUMIN / CREATININE URINE RATIO
Creatinine, Urine: 137.5 mg/dL
Microalb/Creat Ratio: 153 mg/g creat — ABNORMAL HIGH (ref 0–29)
Microalbumin, Urine: 210.4 ug/mL

## 2022-06-06 NOTE — Progress Notes (Signed)
Urine micro albumin is elevated; recommend repeat in 6 months. Continue to recommend balanced, lower carb meals. Smaller meal size, adding snacks. Choosing water as drink of choice and increasing purposeful exercise.

## 2022-06-23 ENCOUNTER — Ambulatory Visit
Admission: EM | Admit: 2022-06-23 | Discharge: 2022-06-23 | Disposition: A | Discharge status: Home / Self Care | Payer: BC Managed Care – PPO | Attending: Emergency Medicine | Admitting: Emergency Medicine

## 2022-06-23 DIAGNOSIS — S30821A Blister (nonthermal) of abdominal wall, initial encounter: Secondary | ICD-10-CM

## 2022-06-23 DIAGNOSIS — L089 Local infection of the skin and subcutaneous tissue, unspecified: Secondary | ICD-10-CM

## 2022-06-23 MED ORDER — MUPIROCIN 2 % EX OINT: 1.0000 | TOPICAL_OINTMENT | Freq: Two times a day (BID) | CUTANEOUS | 0 refills | Status: DC

## 2022-06-23 NOTE — ED Triage Notes (Signed)
Pt c/o bumps in abd x1 day, states look like blisters which don't hurt,itchy or cause any pain. Has applied triple abx ointment w/o relief.

## 2022-06-23 NOTE — Discharge Instructions (Signed)
Apply the mupirocin ointment twice daily for 5-7 days to cover for infection.  Keep the lesions covered with a band aid until a scab has formed and then you can leave them open to air.   If you develop any increased redness, swelling, pain, or fever you need to return for re-evaluation.
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
## 2022-07-01 ENCOUNTER — Ambulatory Visit: Payer: BC Managed Care – PPO | Admitting: Family Medicine

## 2022-07-01 ENCOUNTER — Encounter: Payer: Self-pay | Admitting: Family Medicine

## 2022-07-01 VITALS — BP 118/82 | HR 72 | Temp 98.1°F | Resp 16 | Ht 70.0 in | Wt 340.0 lb

## 2022-07-01 DIAGNOSIS — E1159 Type 2 diabetes mellitus with other circulatory complications: Secondary | ICD-10-CM

## 2022-07-01 DIAGNOSIS — I152 Hypertension secondary to endocrine disorders: Secondary | ICD-10-CM

## 2022-07-01 DIAGNOSIS — Z6841 Body Mass Index (BMI) 40.0 and over, adult: Secondary | ICD-10-CM | POA: Diagnosis not present

## 2022-07-01 MED ORDER — LOSARTAN POTASSIUM-HCTZ 100-25 MG PO TABS: 1.0000 | ORAL_TABLET | Freq: Every day | ORAL | 1 refills | Status: DC

## 2022-07-01 NOTE — Progress Notes (Signed)
I,Sulibeya S Dimas,acting as a Neurosurgeonscribe for Jacky KindleElise T Zamyia Gowell, FNP.,have documented all relevant documentation on the behalf of Jacky Kindlelise T Yomaris Palecek, FNP,as directed by  Jacky KindleElise T Jaystin Mcgarvey, FNP while in the presence of Jacky KindleElise T Macaiah Mangal, FNP.     Established patient visit   Patient: Steve [name]   DOB: 11-22-1975   47 y.o. Male  MRN: [id number] Visit Date: 07/01/2022  Today's healthcare provider: Jacky KindleElise T Netanel Yannuzzi, FNP  Re Introduced to nurse practitioner role and practice setting.  All questions answered.  Discussed provider/patient relationship and expectations.  Chief Complaint  Patient presents with   Hypertension   Subjective    HPI  Follow up for hypertension  The patient was last seen for this 1 months ago. Changes made at last visit include restart Hyzaar 100-25 mg.  He reports excellent compliance with treatment. He feels that condition is Unchanged. He is not having side effects.   BP Readings from Last 3 Encounters:  07/01/22 118/82  06/23/22 134/79  06/02/22 (!) 150/99   -----------------------------------------------------------------------------------------   Medications: Outpatient Medications Prior to Visit  Medication Sig   aspirin EC 81 MG tablet Take 81 mg by mouth daily. Swallow whole.   fenofibrate (TRICOR) 145 MG tablet Take 1 tablet (145 mg total) by mouth daily.   metFORMIN (GLUCOPHAGE-XR) 500 MG 24 hr tablet Take 2 tablets (1,000 mg total) by mouth 2 (two) times daily with a meal.   rosuvastatin (CRESTOR) 20 MG tablet Take 1 tablet (20 mg total) by mouth daily.   Vitamin D, Ergocalciferol, (DRISDOL) 1.25 MG (50000 UNIT) CAPS capsule Take 1 capsule (50,000 Units total) by mouth every 7 (seven) days.   [DISCONTINUED] losartan-hydrochlorothiazide (HYZAAR) 100-25 MG tablet Take 1 tablet by mouth daily. Contains both medications   [DISCONTINUED] mupirocin ointment (BACTROBAN) 2 % Apply 1 Application topically 2 (two) times daily.   No facility-administered  medications prior to visit.    Review of Systems  Constitutional:  Negative for appetite change and fatigue.  Eyes:  Negative for visual disturbance.  Respiratory:  Negative for cough, chest tightness and shortness of breath.   Cardiovascular:  Negative for chest pain.  Gastrointestinal:  Negative for abdominal pain, nausea and vomiting.  Neurological:  Negative for dizziness, light-headedness and headaches.       Objective    BP 118/82 (BP Location: Left Arm, Patient Position: Sitting, Cuff Size: Large)   Pulse 72   Temp 98.1 F (36.7 C) (Temporal)   Resp 16   Ht 5\' 10"  (1.778 m)   Wt (!) 340 lb (154.2 kg)   BMI 48.78 kg/m  BP Readings from Last 3 Encounters:  07/01/22 118/82  06/23/22 134/79  06/02/22 (!) 150/99   Wt Readings from Last 3 Encounters:  07/01/22 (!) 340 lb (154.2 kg)  06/23/22 (!) 345 lb (156.5 kg)  06/02/22 (!) 355 lb (161 kg)      Physical Exam Vitals and nursing note reviewed.  Constitutional:      Appearance: Normal appearance. He is obese.  HENT:     Head: Normocephalic and atraumatic.  Cardiovascular:     Rate and Rhythm: Normal rate and regular rhythm.     Pulses: Normal pulses.     Heart sounds: Normal heart sounds.  Pulmonary:     Effort: Pulmonary effort is normal.     Breath sounds: Normal breath sounds.  Musculoskeletal:        General: Normal range of motion.     Cervical back: Normal range  of motion.     Right lower leg: 2+ Pitting Edema present.     Left lower leg: 2+ Pitting Edema present.  Skin:    General: Skin is warm and dry.     Capillary Refill: Capillary refill takes less than 2 seconds.  Neurological:     General: No focal deficit present.     Mental Status: He is alert and oriented to person, place, and time. Mental status is at baseline.  Psychiatric:        Mood and Affect: Mood normal.        Behavior: Behavior normal.        Thought Content: Thought content normal.        Judgment: Judgment normal.     No  results found for any visits on 07/01/22.  Assessment & Plan     Problem List Items Addressed This Visit       Cardiovascular and Mediastinum   Hypertension associated with diabetes - Primary    Chronic, much improved Likely will meet goal of <129/<79 with continued diet and exercise as well as weight loss Continue hyzaar at 100-25      Relevant Medications   losartan-hydrochlorothiazide (HYZAAR) 100-25 MG tablet     Other   BMI 45.0-49.9, adult    Chronic obesity, has noted 15# weight loss in last month with medication for HTN, HLD and DM Discussed importance of healthy weight management Discussed diet and exercise Continue to recommend balanced, lower carb meals. Smaller meal size, adding snacks. Choosing water as drink of choice and increasing purposeful exercise.       Morbid obesity    Chronic, improving Body mass index is 48.78 kg/m.       Return in about 2 months (around 08/31/2022) for labs .     Leilani Merl, FNP, have reviewed all documentation for this visit. The documentation on 07/01/22 for the exam, diagnosis, procedures, and orders are all accurate and complete.  Jacky Kindle, FNP  [facility] [phone] (phone) [phone] (fax)  [facility]

## 2022-07-01 NOTE — Assessment & Plan Note (Signed)
Chronic obesity, has noted 15# weight loss in last month with medication for HTN, HLD and DM Discussed importance of healthy weight management Discussed diet and exercise Continue to recommend balanced, lower carb meals. Smaller meal size, adding snacks. Choosing water as drink of choice and increasing purposeful exercise.

## 2022-07-01 NOTE — Assessment & Plan Note (Signed)
Chronic, improving Body mass index is 48.78 kg/m.

## 2022-07-01 NOTE — Assessment & Plan Note (Signed)
Chronic, much improved Likely will meet goal of <129/<79 with continued diet and exercise as well as weight loss Continue hyzaar at 100-25

## 2022-08-04 ENCOUNTER — Other Ambulatory Visit: Payer: Self-pay

## 2022-08-04 ENCOUNTER — Emergency Department
Admission: EM | Admit: 2022-08-04 | Discharge: 2022-08-04 | Disposition: A | Discharge status: Home / Self Care | Payer: BC Managed Care – PPO | Attending: Emergency Medicine | Admitting: Emergency Medicine

## 2022-08-04 ENCOUNTER — Emergency Department: Payer: BC Managed Care – PPO

## 2022-08-04 ENCOUNTER — Encounter: Payer: Self-pay | Admitting: Intensive Care

## 2022-08-04 DIAGNOSIS — R197 Diarrhea, unspecified: Secondary | ICD-10-CM | POA: Diagnosis not present

## 2022-08-04 DIAGNOSIS — R1033 Periumbilical pain: Secondary | ICD-10-CM | POA: Diagnosis not present

## 2022-08-04 DIAGNOSIS — R109 Unspecified abdominal pain: Secondary | ICD-10-CM

## 2022-08-04 DIAGNOSIS — R110 Nausea: Secondary | ICD-10-CM | POA: Diagnosis not present

## 2022-08-04 DIAGNOSIS — R1032 Left lower quadrant pain: Secondary | ICD-10-CM | POA: Diagnosis not present

## 2022-08-04 DIAGNOSIS — K760 Fatty (change of) liver, not elsewhere classified: Secondary | ICD-10-CM | POA: Diagnosis not present

## 2022-08-04 LAB — COMPREHENSIVE METABOLIC PANEL
ALT: 30 U/L (ref 0–44)
AST: 22 U/L (ref 15–41)
Albumin: 4 g/dL (ref 3.5–5.0)
Alkaline Phosphatase: 48 U/L (ref 38–126)
Anion gap: 7 (ref 5–15)
BUN: 16 mg/dL (ref 6–20)
CO2: 25 mmol/L (ref 22–32)
Calcium: 8.9 mg/dL (ref 8.9–10.3)
Chloride: 104 mmol/L (ref 98–111)
Creatinine, Ser: 0.82 mg/dL (ref 0.61–1.24)
GFR, Estimated: >60 mL/min (ref >60)
Glucose, Bld: 137 mg/dL — ABNORMAL HIGH (ref 70–99)
Potassium: 4.1 mmol/L (ref 3.5–5.1)
Sodium: 136 mmol/L (ref 135–145)
Total Bilirubin: 1 mg/dL (ref 0.3–1.2)
Total Protein: 7.5 g/dL (ref 6.5–8.1)

## 2022-08-04 LAB — LIPASE, BLOOD: Lipase: 29 U/L (ref 11–51)

## 2022-08-04 LAB — CBC
HCT: 46.8 % (ref 39.0–52.0)
Hemoglobin: 14.7 g/dL (ref 13.0–17.0)
MCH: 27.4 pg (ref 26.0–34.0)
MCHC: 31.4 g/dL (ref 30.0–36.0)
MCV: 87.3 fL (ref 80.0–100.0)
Platelets: 322 10*3/uL (ref 150–400)
RBC: 5.36 MIL/uL (ref 4.22–5.81)
RDW: 14 % (ref 11.5–15.5)
WBC: 9.2 10*3/uL (ref 4.0–10.5)
nRBC: 0 % (ref 0.0–0.2)

## 2022-08-04 MED ORDER — SODIUM CHLORIDE 0.9 % IV BOLUS
1000.0000 mL | Freq: Once | INTRAVENOUS | Status: AC
  Administered 2022-08-04: 1000 mL via INTRAVENOUS

## 2022-08-04 MED ORDER — ONDANSETRON HCL 4 MG PO TABS: 4.0000 mg | ORAL_TABLET | Freq: Four times a day (QID) | ORAL | 0 refills | Status: AC | PRN

## 2022-08-04 MED ORDER — ONDANSETRON HCL 4 MG/2ML IJ SOLN
4.0000 mg | Freq: Once | INTRAMUSCULAR | Status: AC
  Administered 2022-08-04: 4 mg via INTRAVENOUS
  Filled 2022-08-04: qty 2

## 2022-08-04 MED ORDER — IOHEXOL 350 MG/ML SOLN
100.0000 mL | Freq: Once | INTRAVENOUS | Status: AC | PRN
  Administered 2022-08-04: 100 mL via INTRAVENOUS

## 2022-08-04 NOTE — Discharge Instructions (Addendum)
You were seen in the emergency room today for evaluation of your abdominal pain and diarrhea.  Your lab work was fortunately reassuring. Your CT scan showed that you showed a likely GI illness known as enteritis.  Please try to stay hydrated is much as possible.  I have sent a prescription for nausea medicine to your pharmacy that you can take as needed.  Follow with your primary care doctor within a few days if your symptoms not improved.  Return to the ER for any new or worsening symptoms.

## 2022-08-04 NOTE — ED Triage Notes (Signed)
Patient c/o abdominal pain with N/D that started last night. C/o feeling "warm" this AM

## 2022-08-04 NOTE — ED Provider Notes (Signed)
[facility] Provider Note    Event Date/Time   First MD Initiated Contact with Patient 08/04/22 1105     (approximate)   History   Abdominal Pain   HPI  Steve [name] is a 47 y.o. male with history of gallstones presenting to the emergency room for evaluation of abdominal pain.  Last night, patient had onset of abdominal pain primarily along the left side of his abdomen into his periumbilical area with associated nausea without vomiting and multiple episodes of nonbloody diarrhea.  No history of similar.  No history of abdominal surgeries.  Does report pain is improved, but has not completely resolved.     Physical Exam   Triage Vital Signs: ED Triage Vitals  Enc Vitals Group     BP 08/04/22 1034 (!) 161/101     Pulse Rate 08/04/22 1034 60     Resp 08/04/22 1034 16     Temp 08/04/22 1034 98.4 F (36.9 C)     Temp Source 08/04/22 1034 Oral     SpO2 08/04/22 1034 95 %     Weight 08/04/22 1035 (!) 330 lb (149.7 kg)     Height 08/04/22 1035 5\' 10"  (1.778 m)     Head Circumference --      Peak Flow --      Pain Score 08/04/22 1035 3     Pain Loc --      Pain Edu? --      Excl. in GC? --     Most recent vital signs: Vitals:   08/04/22 1034  BP: (!) 161/101  Pulse: 60  Resp: 16  Temp: 98.4 F (36.9 C)  SpO2: 95%     General: Awake, interactive  CV:  Regular rate, good peripheral perfusion.  Resp:  Lungs clear, unlabored respirations.  Abd:  Soft, nondistended, tender to palpation along the left side of the abdomen and in the periumbilical region without rebound or guarding Neuro:  Symmetric facial movement, fluid speech   ED Results / Procedures / Treatments   Labs (all labs ordered are listed, but only abnormal results are displayed) Labs Reviewed  COMPREHENSIVE METABOLIC PANEL - Abnormal; Notable for the following components:      Result Value   Glucose, Bld 137 (*)    All other components within normal limits  LIPASE,  BLOOD  CBC     EKG EKG independently reviewed interpreted by myself (ER attending) demonstrates:    RADIOLOGY Imaging independently reviewed and interpreted by myself demonstrates:  CT A/P with dilated loops of bowel without obvious transition point likely due to diarrheal illness based on history, agree with rads interpretation  PROCEDURES:  Critical Care performed: No  Procedures   MEDICATIONS ORDERED IN ED: Medications  sodium chloride 0.9 % bolus 1,000 mL (0 mLs Intravenous Stopped 08/04/22 1347)  ondansetron (ZOFRAN) injection 4 mg (4 mg Intravenous Given 08/04/22 1226)  iohexol (OMNIPAQUE) 350 MG/ML injection 100 mL (100 mLs Intravenous Contrast Given 08/04/22 1234)     IMPRESSION / MDM / ASSESSMENT AND PLAN / ED COURSE  I reviewed the triage vital signs and the nursing notes.  Differential diagnosis includes, but is not limited to, colitis, diverticulitis, appendicitis, lower suspicion biliary pathology given primary left-sided pain, viral GI illness  Patient's presentation is most consistent with acute presentation with potential threat to life or bodily function.  Plan: Labs, IV fluids, CT, Zofran, currently declining pain control  Labs reassuring. CT with mildly dilated fluid-filled loops  suggestive of enteritis.  Possible partial small bowel obstruction per radiology read, but clinical history much less suggestive of this.  Patient reevaluated following completion of his workup.  He reports feeling improved after receiving fluids and Zofran.  He is comfortable with discharge home.  Prescription for Zofran sent to his pharmacy.  Strict return precautions provided.  Patient discharged stable condition.      FINAL CLINICAL IMPRESSION(S) / ED DIAGNOSES   Final diagnoses:  Acute abdominal pain  Diarrhea of presumed infectious origin     Rx / DC Orders   ED Discharge Orders          Ordered    ondansetron (ZOFRAN) 4 MG tablet  Every 6 hours PRN        08/04/22  1336             Note:  This document was prepared using Dragon voice recognition software and may include unintentional dictation errors.   Trinna Post, MD 08/04/22 [phone]

## 2022-08-19 DIAGNOSIS — J209 Acute bronchitis, unspecified: Secondary | ICD-10-CM | POA: Diagnosis not present

## 2022-08-20 DIAGNOSIS — M10072 Idiopathic gout, left ankle and foot: Secondary | ICD-10-CM | POA: Diagnosis not present

## 2022-08-30 ENCOUNTER — Other Ambulatory Visit: Payer: Self-pay | Admitting: Family Medicine

## 2022-08-31 ENCOUNTER — Ambulatory Visit (INDEPENDENT_AMBULATORY_CARE_PROVIDER_SITE_OTHER): Payer: BC Managed Care – PPO | Admitting: Family Medicine

## 2022-08-31 ENCOUNTER — Encounter: Payer: Self-pay | Admitting: Family Medicine

## 2022-08-31 VITALS — BP 126/73 | HR 73 | Temp 97.9°F | Resp 12 | Ht 70.0 in | Wt 343.7 lb

## 2022-08-31 DIAGNOSIS — Z0000 Encounter for general adult medical examination without abnormal findings: Secondary | ICD-10-CM

## 2022-08-31 DIAGNOSIS — F1290 Cannabis use, unspecified, uncomplicated: Secondary | ICD-10-CM

## 2022-08-31 DIAGNOSIS — E1169 Type 2 diabetes mellitus with other specified complication: Secondary | ICD-10-CM | POA: Diagnosis not present

## 2022-08-31 DIAGNOSIS — E559 Vitamin D deficiency, unspecified: Secondary | ICD-10-CM

## 2022-08-31 DIAGNOSIS — E1159 Type 2 diabetes mellitus with other circulatory complications: Secondary | ICD-10-CM

## 2022-08-31 DIAGNOSIS — E781 Pure hyperglyceridemia: Secondary | ICD-10-CM

## 2022-08-31 DIAGNOSIS — E785 Hyperlipidemia, unspecified: Secondary | ICD-10-CM

## 2022-08-31 DIAGNOSIS — G4733 Obstructive sleep apnea (adult) (pediatric): Secondary | ICD-10-CM

## 2022-08-31 DIAGNOSIS — I152 Hypertension secondary to endocrine disorders: Secondary | ICD-10-CM

## 2022-08-31 NOTE — Progress Notes (Signed)
I,Sulibeya S Dimas,acting as a Neurosurgeon for Jacky Kindle, FNP.,have documented all relevant documentation on the behalf of Jacky Kindle, FNP,as directed by  Jacky Kindle, FNP while in the presence of Jacky Kindle, FNP.  Complete physical exam  Patient: Steve [name]   DOB: 1976-01-06   47 y.o. Male  MRN: [id number] Visit Date: 08/31/2022  Today's healthcare provider: Jacky Kindle, FNP  Re Introduced to nurse practitioner role and practice setting.  All questions answered.  Discussed provider/patient relationship and expectations.  Chief Complaint  Patient presents with   Annual Exam   Subjective    Steve [name] is a 47 y.o. male who presents today for a complete physical exam.  He reports consuming a general diet. Home exercise routine includes walking 1 hrs per week. He generally feels well. He reports sleeping well. He does have additional problems to discuss today.  HPI  Discussed the use of AI scribe software for clinical note transcription with the patient, who gave verbal consent to proceed.  History of Present Illness   The patient, with a history of diabetes, presents with new onset nausea and abdominal cramping that started within the last 24 to 48 hours. They describe the discomfort as intermittent cramping rather than constant pain. The patient suspects that the symptoms may be related to taking a large dose of metformin (2000mg  extended release) all at once. Following the dose, they experienced diarrhea but felt some relief from the abdominal discomfort. However, the symptoms returned after a period of relief. The patient also reports fluctuations in weight, with recent weight gain after a period of weight loss. They attribute the weight changes to stress and lack of motivation related to work and personal issues. The patient also mentions a history of bronchitis and recent treatment for the same.       Past Medical History:  Diagnosis Date   Diabetes  mellitus without complication (HCC)    Gall bladder inflammation    Hypertension    Obstructive sleep apnea    Past Surgical History:  Procedure Laterality Date   ADENOIDECTOMY     Social History   Socioeconomic History   Marital status: Single    Spouse name: Not on file   Number of children: Not on file   Years of education: Not on file   Highest education level: Some college, no degree  Occupational History   Not on file  Tobacco Use   Smoking status: Former    Packs/day: 0.25    Years: 0.50    Additional pack years: 0.00    Total pack years: 0.13    Types: Cigarettes    Quit date: 07/29/1999    Years since quitting: 23.1   Smokeless tobacco: Former    Types: Snuff    Quit date: 08/2018  Vaping Use   Vaping Use: Never used  Substance and Sexual Activity   Alcohol use: Yes    Alcohol/week: 1.0 standard drink of alcohol    Types: 1 Cans of beer per week   Drug use: No   Sexual activity: Not Currently  Other Topics Concern   Not on file  Social History Narrative   Lives alone   Left handed   Caffeine: 2 cups of coffee a day   Social Determinants of Health   Financial Resource Strain: Not on file  Food Insecurity: Not on file  Transportation Needs: Not on file  Physical Activity: Not on file  Stress: Not on file  Social Connections: Not on file  Intimate Partner Violence: Not on file   Family Status  Relation Name Status   Mother  Alive   Father  Alive   MGF  (Not Specified)   Family History  Problem Relation Age of Onset   Diabetes Mother    Heart attack Father 33   Heart attack Maternal Grandfather    Diabetes Maternal Grandfather    No Known Allergies  Patient Care Team: Jacky Kindle, FNP as PCP - General (Family Medicine)   Medications: Outpatient Medications Prior to Visit  Medication Sig   aspirin EC 81 MG tablet Take 81 mg by mouth daily. Swallow whole.   fenofibrate (TRICOR) 145 MG tablet Take 1 tablet (145 mg total) by mouth daily.    losartan-hydrochlorothiazide (HYZAAR) 100-25 MG tablet Take 1 tablet by mouth daily. Contains both medications   metFORMIN (GLUCOPHAGE-XR) 500 MG 24 hr tablet Take 2 tablets (1,000 mg total) by mouth 2 (two) times daily with a meal.   rosuvastatin (CRESTOR) 20 MG tablet Take 1 tablet (20 mg total) by mouth daily.   Vitamin D, Ergocalciferol, (DRISDOL) 1.25 MG (50000 UNIT) CAPS capsule Take 1 capsule (50,000 Units total) by mouth every 7 (seven) days.   No facility-administered medications prior to visit.    Review of Systems  Gastrointestinal:  Positive for diarrhea and nausea. Negative for abdominal distention, abdominal pain, blood in stool, constipation and vomiting.  All other systems reviewed and are negative.   Last CBC Lab Results  Component Value Date   WBC 9.2 08/04/2022   HGB 14.7 08/04/2022   HCT 46.8 08/04/2022   MCV 87.3 08/04/2022   MCH 27.4 08/04/2022   RDW 14.0 08/04/2022   PLT 322 08/04/2022   Last metabolic panel Lab Results  Component Value Date   GLUCOSE 137 (H) 08/04/2022   NA 136 08/04/2022   K 4.1 08/04/2022   CL 104 08/04/2022   CO2 25 08/04/2022   BUN 16 08/04/2022   CREATININE 0.82 08/04/2022   GFRNONAA >60 08/04/2022   CALCIUM 8.9 08/04/2022   PROT 7.5 08/04/2022   ALBUMIN 4.0 08/04/2022   LABGLOB 2.7 06/02/2022   AGRATIO 1.5 06/02/2022   BILITOT 1.0 08/04/2022   ALKPHOS 48 08/04/2022   AST 22 08/04/2022   ALT 30 08/04/2022   ANIONGAP 7 08/04/2022   Last lipids Lab Results  Component Value Date   CHOL 119 06/02/2022   HDL 27 (L) 06/02/2022   LDLCALC 55 06/02/2022   TRIG 225 (H) 06/02/2022   CHOLHDL 4.4 06/02/2022   Last hemoglobin A1c Lab Results  Component Value Date   HGBA1C 6.8 (H) 06/02/2022   Last thyroid functions Lab Results  Component Value Date   TSH 2.960 03/02/2022   Last vitamin D Lab Results  Component Value Date   VD25OH 19.2 (L) 06/02/2022      Objective    BP 126/73 (BP Location: Left Arm, Patient  Position: Sitting, Cuff Size: Large)   Pulse 73   Temp 97.9 F (36.6 C) (Temporal)   Resp 12   Ht 5\' 10"  (1.778 m)   Wt (!) 343 lb 11.2 oz (155.9 kg)   SpO2 95%   BMI 49.32 kg/m  BP Readings from Last 3 Encounters:  08/31/22 126/73  08/04/22 (!) 161/101  07/01/22 118/82   Wt Readings from Last 3 Encounters:  08/31/22 (!) 343 lb 11.2 oz (155.9 kg)  08/04/22 (!) 330 lb (149.7 kg)  07/01/22 Marland Kitchen)  340 lb (154.2 kg)   Physical Exam Vitals and nursing note reviewed.  Constitutional:      General: He is awake. He is not in acute distress.    Appearance: Normal appearance. He is well-developed and well-groomed. He is obese. He is not ill-appearing, toxic-appearing or diaphoretic.  HENT:     Head: Normocephalic and atraumatic.     Jaw: There is normal jaw occlusion. No trismus, tenderness, swelling or pain on movement.     Salivary Glands: Right salivary gland is not diffusely enlarged or tender. Left salivary gland is not diffusely enlarged or tender.     Right Ear: Hearing, tympanic membrane, ear canal and external ear normal. There is no impacted cerumen.     Left Ear: Hearing, tympanic membrane, ear canal and external ear normal. There is no impacted cerumen.     Nose: Nose normal. No congestion or rhinorrhea.     Right Turbinates: Not enlarged, swollen or pale.     Left Turbinates: Not enlarged, swollen or pale.     Right Sinus: No maxillary sinus tenderness or frontal sinus tenderness.     Left Sinus: No maxillary sinus tenderness or frontal sinus tenderness.     Mouth/Throat:     Lips: Pink.     Mouth: Mucous membranes are moist. No injury, lacerations, oral lesions or angioedema.     Pharynx: Oropharynx is clear. Uvula midline. No pharyngeal swelling, oropharyngeal exudate or posterior oropharyngeal erythema.     Tonsils: No tonsillar exudate or tonsillar abscesses.  Eyes:     General: Lids are normal. Vision grossly intact. Gaze aligned appropriately.        Right eye: No  discharge.        Left eye: No discharge.     Extraocular Movements: Extraocular movements intact.     Conjunctiva/sclera: Conjunctivae normal.     Pupils: Pupils are equal, round, and reactive to light.  Neck:     Thyroid: No thyroid mass, thyromegaly or thyroid tenderness.     Vascular: No carotid bruit.     Trachea: Trachea normal. No tracheal tenderness.  Cardiovascular:     Rate and Rhythm: Normal rate and regular rhythm.     Pulses: Normal pulses.          Carotid pulses are 2+ on the right side and 2+ on the left side.      Radial pulses are 2+ on the right side and 2+ on the left side.       Femoral pulses are 2+ on the right side and 2+ on the left side.      Popliteal pulses are 2+ on the right side and 2+ on the left side.       Dorsalis pedis pulses are 2+ on the right side and 2+ on the left side.       Posterior tibial pulses are 2+ on the right side and 2+ on the left side.     Heart sounds: Normal heart sounds, S1 normal and S2 normal. No murmur heard.    No friction rub. No gallop.  Pulmonary:     Effort: Pulmonary effort is normal. No respiratory distress.     Breath sounds: Normal breath sounds and air entry. No stridor. No wheezing, rhonchi or rales.  Chest:     Chest wall: No tenderness.  Abdominal:     General: Abdomen is flat. Bowel sounds are normal. There is no distension.     Palpations: Abdomen is soft. There  is no mass.     Tenderness: There is no abdominal tenderness. There is no guarding or rebound.     Hernia: No hernia is present.  Genitourinary:    Comments: Exam deferred; denies complaints Musculoskeletal:        General: No swelling, tenderness, deformity or signs of injury. Normal range of motion.     Cervical back: Normal range of motion and neck supple. No rigidity or tenderness.     Right lower leg: No edema.     Left lower leg: No edema.  Lymphadenopathy:     Cervical: No cervical adenopathy.     Right cervical: No superficial, deep or  posterior cervical adenopathy.    Left cervical: No superficial, deep or posterior cervical adenopathy.  Skin:    General: Skin is warm and dry.     Capillary Refill: Capillary refill takes less than 2 seconds.     Coloration: Skin is not jaundiced or pale.     Findings: No bruising, erythema, lesion or rash.  Neurological:     General: No focal deficit present.     Mental Status: He is alert and oriented to person, place, and time. Mental status is at baseline.     GCS: GCS eye subscore is 4. GCS verbal subscore is 5. GCS motor subscore is 6.     Sensory: Sensation is intact. No sensory deficit.     Motor: Motor function is intact. No weakness.     Coordination: Coordination is intact.     Gait: Gait is intact.  Psychiatric:        Attention and Perception: Attention and perception normal.        Mood and Affect: Mood and affect normal.        Speech: Speech normal.        Behavior: Behavior normal. Behavior is cooperative.        Thought Content: Thought content normal.        Cognition and Memory: Cognition normal.        Judgment: Judgment normal.      Last depression screening scores    08/31/2022    8:40 AM 07/01/2022    1:56 PM 06/02/2022    1:24 PM  PHQ 2/9 Scores  PHQ - 2 Score 1 0 0  PHQ- 9 Score 4 2 5    Last fall risk screening    08/31/2022    8:39 AM  Fall Risk   Falls in the past year? 0  Number falls in past yr: 0  Injury with Fall? 0  Risk for fall due to : No Fall Risks  Follow up Falls evaluation completed   Last Audit-C alcohol use screening    08/31/2022    8:40 AM  Alcohol Use Disorder Test (AUDIT)  1. How often do you have a drink containing alcohol? 1  2. How many drinks containing alcohol do you have on a typical day when you are drinking? 0  3. How often do you have six or more drinks on one occasion? 0  AUDIT-C Score 1   A score of 3 or more in women, and 4 or more in men indicates increased risk for alcohol abuse, EXCEPT if all of the points  are from question 1   No results found for any visits on 08/31/22.  Assessment & Plan    Routine Health Maintenance and Physical Exam  Exercise Activities and Dietary recommendations  Goals   None     Immunization  History  Administered Date(s) Administered   Influenza,inj,Quad PF,6+ Mos 06/02/2022    Health Maintenance  Topic Date Due   COVID-19 Vaccine (1) Never done   FOOT EXAM  Never done   OPHTHALMOLOGY EXAM  Never done   DTaP/Tdap/Td (1 - Tdap) Never done   Colonoscopy  Never done   INFLUENZA VACCINE  10/27/2022   HEMOGLOBIN A1C  12/03/2022   Diabetic kidney evaluation - Urine ACR  06/02/2023   Diabetic kidney evaluation - eGFR measurement  08/04/2023   Hepatitis C Screening  Completed   HIV Screening  Completed   HPV VACCINES  Aged Out    Discussed health benefits of physical activity, and encouraged him to engage in regular exercise appropriate for his age and condition. Assessment and Plan    Gastrointestinal upset: Recent onset of nausea and diarrhea after taking a large dose of Metformin (2000mg ). No severe abdominal pain. Symptoms improved with bowel movement but have since returned. -Hold Metformin today due to ongoing symptoms. -Resume Metformin tomorrow at a lower dose (500mg  BID), gradually increasing to previous dose over the next few days.  Type 2 Diabetes Mellitus: Stable on Metformin, recent A1C at 6.8, down from 7.6. Discussed potential use of injectable medications for weight loss and blood sugar control, but patient prefers to continue with oral medication. -Continue Metformin, adjusting dose as above. -Check A1C and Vitamin D levels today.  Weight Management: Weight fluctuation noted, with recent weight gain after previous loss. Patient reports lack of motivation and fatigue, possibly related to work stress and financial concerns. -Encourage continued efforts towards weight loss and regular exercise. -Schedule follow-up in 3 months to monitor  progress.  Colon Cancer Screening: Patient not ready for colonoscopy or Cologuard at this time. -Discuss Cologuard at next visit.  Eye Health: Patient reports needing new glasses and plans to schedule an eye visit. -Encourage patient to schedule eye visit.  Mental Health: Patient reports lack of interest and motivation, possibly related to work stress and financial concerns. Currently considering therapy. -Encourage patient to pursue therapy if comfortable. -Check in on mental health at next visit.       No follow-ups on file.    Leilani Merl, FNP, have reviewed all documentation for this visit. The documentation on 08/31/22 for the exam, diagnosis, procedures, and orders are all accurate and complete.  Jacky Kindle, FNP  [facility] [phone] (phone) [phone] (fax)  [facility]

## 2022-09-01 LAB — HEMOGLOBIN A1C
Est. average glucose Bld gHb Est-mCnc: 171 mg/dL
Hgb A1c MFr Bld: 7.6 % — ABNORMAL HIGH (ref 4.8–5.6)

## 2022-09-01 LAB — VITAMIN D 25 HYDROXY (VIT D DEFICIENCY, FRACTURES): Vit D, 25-Hydroxy: 25.5 ng/mL — ABNORMAL LOW (ref 30.0–100.0)

## 2022-09-01 NOTE — Progress Notes (Signed)
Diabetes again is elevated without good glycemic control. A1c up 0.8%; goal <7%. Now 7.6% Continue to recommend balanced, lower carb meals. Smaller meal size, adding snacks. Choosing water as drink of choice and increasing purposeful exercise. Vit D is improved; however, borderline. Continue supplementation.  Continue to work on medication compliance and lifestyle changes.

## 2022-09-02 ENCOUNTER — Telehealth: Payer: Self-pay

## 2022-09-02 NOTE — Telephone Encounter (Signed)
Pt returned our call for lab results. Shared provider's note. Steve Kindle, FNP 09/01/2022  7:58 AM EDT     Diabetes again is elevated without good glycemic control. A1c up 0.8%; goal <7%. Now 7.6% Continue to recommend balanced, lower carb meals. Smaller meal size, adding snacks. Choosing water as drink of choice and increasing purposeful exercise. Vit D is improved; however, borderline. Continue supplementation. Continue to work on medication compliance and lifestyle changes.   Pt will continue to work on diet and exercise.

## 2022-10-26 ENCOUNTER — Ambulatory Visit: Payer: BC Managed Care – PPO | Admitting: Adult Health

## 2022-11-20 ENCOUNTER — Inpatient Hospital Stay
Admission: EM | Admit: 2022-11-20 | Discharge: 2022-11-21 | DRG: 389 | Disposition: A | Discharge status: Home / Self Care | Payer: Self-pay | Attending: Osteopathic Medicine | Admitting: Osteopathic Medicine

## 2022-11-20 ENCOUNTER — Inpatient Hospital Stay: Payer: Self-pay

## 2022-11-20 ENCOUNTER — Emergency Department: Payer: Self-pay

## 2022-11-20 ENCOUNTER — Other Ambulatory Visit: Payer: Self-pay

## 2022-11-20 DIAGNOSIS — K8020 Calculus of gallbladder without cholecystitis without obstruction: Secondary | ICD-10-CM | POA: Diagnosis present

## 2022-11-20 DIAGNOSIS — Z8249 Family history of ischemic heart disease and other diseases of the circulatory system: Secondary | ICD-10-CM

## 2022-11-20 DIAGNOSIS — R0902 Hypoxemia: Secondary | ICD-10-CM | POA: Diagnosis present

## 2022-11-20 DIAGNOSIS — F419 Anxiety disorder, unspecified: Secondary | ICD-10-CM | POA: Diagnosis present

## 2022-11-20 DIAGNOSIS — K760 Fatty (change of) liver, not elsewhere classified: Secondary | ICD-10-CM | POA: Diagnosis present

## 2022-11-20 DIAGNOSIS — E1159 Type 2 diabetes mellitus with other circulatory complications: Secondary | ICD-10-CM | POA: Diagnosis present

## 2022-11-20 DIAGNOSIS — Z79899 Other long term (current) drug therapy: Secondary | ICD-10-CM

## 2022-11-20 DIAGNOSIS — K565 Intestinal adhesions [bands], unspecified as to partial versus complete obstruction: Principal | ICD-10-CM | POA: Diagnosis present

## 2022-11-20 DIAGNOSIS — Z8719 Personal history of other diseases of the digestive system: Secondary | ICD-10-CM

## 2022-11-20 DIAGNOSIS — G4733 Obstructive sleep apnea (adult) (pediatric): Secondary | ICD-10-CM | POA: Diagnosis present

## 2022-11-20 DIAGNOSIS — E66813 Obesity, class 3: Secondary | ICD-10-CM | POA: Insufficient documentation

## 2022-11-20 DIAGNOSIS — Z833 Family history of diabetes mellitus: Secondary | ICD-10-CM

## 2022-11-20 DIAGNOSIS — K56609 Unspecified intestinal obstruction, unspecified as to partial versus complete obstruction: Principal | ICD-10-CM

## 2022-11-20 DIAGNOSIS — Z7984 Long term (current) use of oral hypoglycemic drugs: Secondary | ICD-10-CM

## 2022-11-20 DIAGNOSIS — Z6841 Body Mass Index (BMI) 40.0 and over, adult: Secondary | ICD-10-CM

## 2022-11-20 DIAGNOSIS — Z8739 Personal history of other diseases of the musculoskeletal system and connective tissue: Secondary | ICD-10-CM

## 2022-11-20 DIAGNOSIS — I152 Hypertension secondary to endocrine disorders: Secondary | ICD-10-CM | POA: Diagnosis present

## 2022-11-20 DIAGNOSIS — Z87891 Personal history of nicotine dependence: Secondary | ICD-10-CM

## 2022-11-20 DIAGNOSIS — E1169 Type 2 diabetes mellitus with other specified complication: Secondary | ICD-10-CM | POA: Diagnosis present

## 2022-11-20 DIAGNOSIS — Z7982 Long term (current) use of aspirin: Secondary | ICD-10-CM

## 2022-11-20 DIAGNOSIS — M109 Gout, unspecified: Secondary | ICD-10-CM | POA: Diagnosis present

## 2022-11-20 DIAGNOSIS — E785 Hyperlipidemia, unspecified: Secondary | ICD-10-CM | POA: Diagnosis present

## 2022-11-20 DIAGNOSIS — E119 Type 2 diabetes mellitus without complications: Secondary | ICD-10-CM

## 2022-11-20 LAB — COMPREHENSIVE METABOLIC PANEL
ALT: 41 U/L (ref 0–44)
AST: 34 U/L (ref 15–41)
Albumin: 4.3 g/dL (ref 3.5–5.0)
Alkaline Phosphatase: 49 U/L (ref 38–126)
Anion gap: 11 (ref 5–15)
BUN: 14 mg/dL (ref 6–20)
CO2: 20 mmol/L — ABNORMAL LOW (ref 22–32)
Calcium: 9.5 mg/dL (ref 8.9–10.3)
Chloride: 104 mmol/L (ref 98–111)
Creatinine, Ser: 0.97 mg/dL (ref 0.61–1.24)
GFR, Estimated: >60 mL/min (ref >60)
Glucose, Bld: 150 mg/dL — ABNORMAL HIGH (ref 70–99)
Potassium: 3.8 mmol/L (ref 3.5–5.1)
Sodium: 135 mmol/L (ref 135–145)
Total Bilirubin: 1.1 mg/dL (ref 0.3–1.2)
Total Protein: 8.6 g/dL — ABNORMAL HIGH (ref 6.5–8.1)

## 2022-11-20 LAB — URINALYSIS, ROUTINE W REFLEX MICROSCOPIC
Bilirubin Urine: NEGATIVE
Glucose, UA: NEGATIVE mg/dL
Hgb urine dipstick: NEGATIVE
Ketones, ur: NEGATIVE mg/dL
Leukocytes,Ua: NEGATIVE
Nitrite: NEGATIVE
Protein, ur: 30 mg/dL — AB
Specific Gravity, Urine: >1.046 — ABNORMAL HIGH (ref 1.005–1.030)
pH: 5 (ref 5.0–8.0)

## 2022-11-20 LAB — CBC
HCT: 46.9 % (ref 39.0–52.0)
Hemoglobin: 15.3 g/dL (ref 13.0–17.0)
MCH: 27.8 pg (ref 26.0–34.0)
MCHC: 32.6 g/dL (ref 30.0–36.0)
MCV: 85.3 fL (ref 80.0–100.0)
Platelets: 332 10*3/uL (ref 150–400)
RBC: 5.5 MIL/uL (ref 4.22–5.81)
RDW: 13.7 % (ref 11.5–15.5)
WBC: 11.7 10*3/uL — ABNORMAL HIGH (ref 4.0–10.5)
nRBC: 0 % (ref 0.0–0.2)

## 2022-11-20 LAB — GLUCOSE, CAPILLARY: Glucose-Capillary: 161 mg/dL — ABNORMAL HIGH (ref 70–99)

## 2022-11-20 LAB — LIPASE, BLOOD: Lipase: 36 U/L (ref 11–51)

## 2022-11-20 MED ORDER — ONDANSETRON HCL 4 MG/2ML IJ SOLN
4.0000 mg | Freq: Once | INTRAMUSCULAR | Status: AC
  Administered 2022-11-20: 4 mg via INTRAVENOUS
  Filled 2022-11-20: qty 2

## 2022-11-20 MED ORDER — ONDANSETRON HCL 4 MG/2ML IJ SOLN: 4.0000 mg | Freq: Four times a day (QID) | INTRAMUSCULAR | Status: DC | PRN

## 2022-11-20 MED ORDER — ONDANSETRON HCL 4 MG PO TABS: 4.0000 mg | ORAL_TABLET | Freq: Four times a day (QID) | ORAL | Status: DC | PRN

## 2022-11-20 MED ORDER — LACTATED RINGERS IV SOLN: INTRAVENOUS | Status: AC

## 2022-11-20 MED ORDER — SENNOSIDES-DOCUSATE SODIUM 8.6-50 MG PO TABS: 1.0000 | ORAL_TABLET | Freq: Every evening | ORAL | Status: DC | PRN

## 2022-11-20 MED ORDER — IOHEXOL 350 MG/ML SOLN
100.0000 mL | Freq: Once | INTRAVENOUS | Status: AC | PRN
  Administered 2022-11-20: 75 mL via INTRAVENOUS

## 2022-11-20 MED ORDER — FENTANYL CITRATE PF 50 MCG/ML IJ SOSY: 12.5000 ug | PREFILLED_SYRINGE | INTRAMUSCULAR | Status: AC | PRN

## 2022-11-20 MED ORDER — LORAZEPAM 2 MG/ML IJ SOLN: 1.0000 mg | Freq: Four times a day (QID) | INTRAMUSCULAR | Status: AC | PRN

## 2022-11-20 MED ORDER — DIATRIZOATE MEGLUMINE & SODIUM 66-10 % PO SOLN
90.0000 mL | Freq: Once | ORAL | Status: AC
  Administered 2022-11-21: 90 mL via NASOGASTRIC

## 2022-11-20 MED ORDER — HYDRALAZINE HCL 20 MG/ML IJ SOLN: 5.0000 mg | Freq: Four times a day (QID) | INTRAMUSCULAR | Status: DC | PRN

## 2022-11-20 MED ORDER — SODIUM CHLORIDE 0.9 % IV BOLUS
500.0000 mL | Freq: Once | INTRAVENOUS | Status: AC
  Administered 2022-11-20: 500 mL via INTRAVENOUS

## 2022-11-20 MED ORDER — INSULIN ASPART 100 UNIT/ML IJ SOLN
0.0000 [IU] | Freq: Three times a day (TID) | INTRAMUSCULAR | Status: DC
  Administered 2022-11-21: 2 [IU] via SUBCUTANEOUS
  Filled 2022-11-20: qty 1

## 2022-11-20 MED ORDER — ACETAMINOPHEN 10 MG/ML IV SOLN: 1000.0000 mg | Freq: Four times a day (QID) | INTRAVENOUS | Status: AC | PRN

## 2022-11-20 MED ORDER — MORPHINE SULFATE (PF) 4 MG/ML IV SOLN: 4.0000 mg | INTRAVENOUS | Status: AC | PRN

## 2022-11-20 MED ORDER — INSULIN ASPART 100 UNIT/ML IJ SOLN: 0.0000 [IU] | Freq: Every day | INTRAMUSCULAR | Status: DC

## 2022-11-20 NOTE — Assessment & Plan Note (Signed)
-   This complicates overall care and prognosis.  

## 2022-11-20 NOTE — Assessment & Plan Note (Addendum)
Insulin SSI with at bedtime coverage ordered, renal dosing in setting of n.p.o. state on admission AM team to change insulin SSI coverage as appropriate

## 2022-11-20 NOTE — Assessment & Plan Note (Signed)
-  CPAP nightly ordered 

## 2022-11-20 NOTE — Consult Note (Signed)
Subjective:   CC: SBO  HPI:  Steve [name] is a 47 y.o. male who was consulted by Cyril Loosen for issue above.  Symptoms were first noted 1 day ago. Pain is sharp, confined to the periumbilical area, without radiation.  Associated with N/V/D, exacerbated by touch.  Last diarrhea just a few minutes ago.  Been having episodes of diarrhea throughout the pain.     Past Medical History:  has a past medical history of Diabetes mellitus without complication (HCC), Gall bladder inflammation, Hypertension, and Obstructive sleep apnea.  Past Surgical History:  Past Surgical History:  Procedure Laterality Date   ADENOIDECTOMY      Family History: family history includes Diabetes in his maternal grandfather and mother; Heart attack in his maternal grandfather; Heart attack (age of onset: 64) in his father.  Social History:  reports that he quit smoking about 23 years ago. His smoking use included cigarettes. He started smoking about 23 years ago. He has a 0.1 pack-year smoking history. He quit smokeless tobacco use about 4 years ago.  His smokeless tobacco use included snuff. He reports current alcohol use of about 1.0 standard drink of alcohol per week. He reports that he does not use drugs.  Current Medications:  Prior to Admission medications   Medication Sig Start Date End Date Taking? Authorizing Provider  aspirin EC 81 MG tablet Take 81 mg by mouth daily. Swallow whole.   Yes [provider]  fenofibrate (TRICOR) 145 MG tablet Take 1 tablet (145 mg total) by mouth daily. 06/02/22  Yes Jacky Kindle, FNP  losartan-hydrochlorothiazide (HYZAAR) 100-25 MG tablet Take 1 tablet by mouth daily. Contains both medications 07/01/22  Yes Merita Norton T, FNP  metFORMIN (GLUCOPHAGE-XR) 500 MG 24 hr tablet Take 2 tablets (1,000 mg total) by mouth 2 (two) times daily with a meal. 06/02/22  Yes Jacky Kindle, FNP  rosuvastatin (CRESTOR) 20 MG tablet Take 1 tablet (20 mg total) by mouth daily. 06/02/22  Yes  Merita Norton T, FNP  Vitamin D, Ergocalciferol, (DRISDOL) 1.25 MG (50000 UNIT) CAPS capsule Take 1 capsule (50,000 Units total) by mouth every 7 (seven) days. 06/02/22  Yes Jacky Kindle, FNP    Allergies:  Allergies as of 11/20/2022   (No Known Allergies)    ROS:  General: Denies weight loss, weight gain, fatigue, fevers, chills, and night sweats. Eyes: Denies blurry vision, double vision, eye pain, itchy eyes, and tearing. Ears: Denies hearing loss, earache, and ringing in ears. Nose: Denies sinus pain, congestion, infections, runny nose, and nosebleeds. Mouth/throat: Denies hoarseness, sore throat, bleeding gums, and difficulty swallowing. Heart: Denies chest pain, palpitations, racing heart, irregular heartbeat, leg pain or swelling, and decreased activity tolerance. Respiratory: Denies breathing difficulty, shortness of breath, wheezing, cough, and sputum. GI: Denies change in appetite, heartburn, nausea, vomiting, constipation, diarrhea, and blood in stool. GU: Denies difficulty urinating, pain with urinating, urgency, frequency, blood in urine. Musculoskeletal: Denies joint stiffness, pain, swelling, muscle weakness. Skin: Denies rash, itching, mass, tumors, sores, and boils Neurologic: Denies headache, fainting, dizziness, seizures, numbness, and tingling. Psychiatric: Denies depression, anxiety, difficulty sleeping, and memory loss. Endocrine: Denies heat or cold intolerance, and increased thirst or urination. Blood/lymph: Denies easy bruising, easy bruising, and swollen glands     Objective:     BP (!) 146/79   Pulse (!) 59   Temp 97.7 F (36.5 C) (Oral)   Resp 18   Ht 5\' 10"  (1.778 m)   Wt (!) 154.2 kg  SpO2 97%   BMI 48.78 kg/m   Constitutional :  alert, cooperative, appears stated age, and no distress  Lymphatics/Throat:  no asymmetry, masses, or scars  Respiratory:  clear to auscultation bilaterally  Cardiovascular:  regular rate and rhythm, S1, S2 normal,  no murmur, click, rub or gallop and regular rate and rhythm  Gastrointestinal: Soft, no guarding, distended, TTP periumbilical area .   Musculoskeletal: Steady movement  Skin: Cool and moist   Psychiatric: Normal affect, non-agitated, not confused       LABS:     Latest Ref Rng & Units 11/20/2022   12:30 PM 08/04/2022   10:37 AM 06/02/2022    1:58 PM  CMP  Glucose 70 - 99 mg/dL 366  440  97   BUN 6 - 20 mg/dL 14  16  12    Creatinine 0.61 - 1.24 mg/dL 3.47  4.25  9.56   Sodium 135 - 145 mmol/L 135  136  143   Potassium 3.5 - 5.1 mmol/L 3.8  4.1  4.3   Chloride 98 - 111 mmol/L 104  104  104   CO2 22 - 32 mmol/L 20  25  23    Calcium 8.9 - 10.3 mg/dL 9.5  8.9  9.2   Total Protein 6.5 - 8.1 g/dL 8.6  7.5  6.8   Total Bilirubin 0.3 - 1.2 mg/dL 1.1  1.0  0.4   Alkaline Phos 38 - 126 U/L 49  48  71   AST 15 - 41 U/L 34  22  20   ALT 0 - 44 U/L 41  30  27       Latest Ref Rng & Units 11/20/2022   12:30 PM 08/04/2022   10:37 AM 06/02/2022    1:58 PM  CBC  WBC 4.0 - 10.5 K/uL 11.7  9.2  8.8   Hemoglobin 13.0 - 17.0 g/dL 38.7  56.4  33.2   Hematocrit 39.0 - 52.0 % 46.9  46.8  39.4   Platelets 150 - 400 K/uL 332  322  310     RADS: Narrative & Impression  CLINICAL DATA:  Left lower quadrant pain. Nausea and vomiting. Diarrhea. Cholelithiasis.   EXAM: CT ABDOMEN AND PELVIS WITH CONTRAST   TECHNIQUE: Multidetector CT imaging of the abdomen and pelvis was performed using the standard protocol following bolus administration of intravenous contrast.   RADIATION DOSE REDUCTION: This exam was performed according to the departmental dose-optimization program which includes automated exposure control, adjustment of the mA and/or kV according to patient size and/or use of iterative reconstruction technique.   CONTRAST:  75mL OMNIPAQUE IOHEXOL 350 MG/ML SOLN   COMPARISON:  08/04/2022   FINDINGS: Lower Chest: No acute findings.   Hepatobiliary: No suspicious hepatic masses  identified. Mild-to-moderate diffuse steatosis is seen, with focal fatty sparing in the central left lobe. Gallstones are seen, however there is no evidence of cholecystitis or biliary dilatation.   Pancreas:  No mass or inflammatory changes.   Spleen: Within normal limits in size and appearance.   Adrenals/Urinary Tract: No suspicious masses identified. No evidence of ureteral calculi or hydronephrosis.   Stomach/Bowel: Moderately dilated distal small bowel loops with air-fluid levels show mild increase since previous study. Transition point is seen in the right abdomen on image 62/2 consistent with small-bowel obstruction. A beaked appearance this is suspicious for adhesion. No evidence of inflammatory process, mass, or abnormal fluid collections.   Vascular/Lymphatic: No pathologically enlarged lymph nodes. No acute vascular findings.  Reproductive:  No mass or other significant abnormality.   Other:  None.   Musculoskeletal:  No suspicious bone lesions identified.   IMPRESSION: Mild increase in distal small bowel dilatation with transition point in right abdomen. This is consistent with small-bowel obstruction, suspicious for adhesion.   Cholelithiasis. No radiographic evidence of cholecystitis.   Hepatic steatosis.     Electronically Signed   By: Danae Orleans M.D.   On: 11/20/2022 15:27   Assessment:   SBO noted on CT, although history is not consistent due to persistent diarrhea and no history of abodominal surgeries in the past.  Plan:   Recommend SBFT to see if persistent issues.  IF pt unable to toelrate NG, can give the contrast via oral route.  NPO, IVF, serial abdominal exams in the meantime.  Further care of chronic issues per hospitalist  The patient verbalized understanding and all questions were answered to the patient's satisfaction.  labs/images/medications/previous chart entries reviewed personally and relevant changes/updates noted  above.

## 2022-11-20 NOTE — Assessment & Plan Note (Signed)
As needed Ativan 1 mg IV every 6 hours as needed for anxiety, 15 hours of coverage ordered

## 2022-11-20 NOTE — Assessment & Plan Note (Signed)
Home atorvastatin 20 mg daily not resumed on admission.  AM team to resume when benefits outweigh the risk

## 2022-11-20 NOTE — ED Provider Notes (Signed)
[facility] Provider Note    Event Date/Time   First MD Initiated Contact with Patient 11/20/22 1254     (approximate)   History   Abdominal Pain   HPI  Steve [name] is a 47 y.o. male who presents with complaints of nausea vomiting diarrhea and abdominal pain.  Patient describes left lower quadrant abdominal pain, the symptoms started overnight.  No history of abdominal surgery.  No fevers reported but some myalgias.     Physical Exam   Triage Vital Signs: ED Triage Vitals  Encounter Vitals Group     BP 11/20/22 1229 135/77     Systolic BP Percentile --      Diastolic BP Percentile --      Pulse Rate 11/20/22 1229 77     Resp 11/20/22 1229 18     Temp 11/20/22 1229 97.9 F (36.6 C)     Temp Source 11/20/22 1229 Oral     SpO2 11/20/22 1229 98 %     Weight 11/20/22 1227 (!) 154.2 kg (340 lb)     Height 11/20/22 1227 1.778 m (5\' 10" )     Head Circumference --      Peak Flow --      Pain Score 11/20/22 1227 7     Pain Loc --      Pain Education --      Exclude from Growth Chart --     Most recent vital signs: Vitals:   11/20/22 1229  BP: 135/77  Pulse: 77  Resp: 18  Temp: 97.9 F (36.6 C)  SpO2: 98%     General: Awake, no distress.  CV:  Good peripheral perfusion.  Resp:  Normal effort.  Abd:  No distention.  Mild tenderness in the left lower quadrant Other:     ED Results / Procedures / Treatments   Labs (all labs ordered are listed, but only abnormal results are displayed) Labs Reviewed  COMPREHENSIVE METABOLIC PANEL - Abnormal; Notable for the following components:      Result Value   CO2 20 (*)    Glucose, Bld 150 (*)    Total Protein 8.6 (*)    All other components within normal limits  CBC - Abnormal; Notable for the following components:   WBC 11.7 (*)    All other components within normal limits  LIPASE, BLOOD  URINALYSIS, ROUTINE W REFLEX MICROSCOPIC     EKG     RADIOLOGY CT abdomen  pelvis    PROCEDURES:  Critical Care performed:   Procedures   MEDICATIONS ORDERED IN ED: Medications  sodium chloride 0.9 % bolus 500 mL (500 mLs Intravenous New Bag/Given 11/20/22 1356)  ondansetron (ZOFRAN) injection 4 mg (4 mg Intravenous Given 11/20/22 1358)  iohexol (OMNIPAQUE) 350 MG/ML injection 100 mL (75 mLs Intravenous Contrast Given 11/20/22 1417)     IMPRESSION / MDM / ASSESSMENT AND PLAN / ED COURSE  I reviewed the triage vital signs and the nursing notes. Patient's presentation is most consistent with acute presentation with potential threat to life or bodily function.  Patient presents with symptoms as above, differential includes viral gastroenteritis, diverticulitis, colitis  Lab work reviewed and is overall reassuring, treated with IV fluids, IV Zofran, sent for CT abdomen pelvis to evaluate for diverticulitis or colitis  CT scan consistent with SBO, will place NG tube, have consulted surgery, will admit to medicine        FINAL CLINICAL IMPRESSION(S) / ED DIAGNOSES   Final  diagnoses:  SBO (small bowel obstruction) (HCC)     Rx / DC Orders   ED Discharge Orders     None        Note:  This document was prepared using Dragon voice recognition software and may include unintentional dictation errors.   Jene Every, MD 11/20/22 [phone]

## 2022-11-20 NOTE — Assessment & Plan Note (Signed)
Suspect secondary to adhesions per CT read EDP placed order for NG tube placement, 16 French, maintain low intermittent suction Conservative management on admission Status post sodium chloride 500 mL bolus Lactated Ringer's infusion at 150 mL/h, 1 day ordered Symptomatic support: Acetaminophen 1000 mg IV every 6 hours as needed for mild pain, fever, headache 24 hours of coverage ordered; morphine 4 mg IV every 4 hours as needed for moderate pain, 20 hours of coverage ordered; fentanyl 12.5 mcg IV every 4 hours as needed for severe pain, 15 hours of coverage ordered Admit to telemetry medical, inpatient

## 2022-11-20 NOTE — H&P (Addendum)
History and Physical   Steve [name]:[id number] DOB: 1975-09-01 DOA: 11/20/2022  PCP: Steve Kindle, FNP  Patient coming from: home  I have personally briefly reviewed patient's old medical records in [facility] EMR.  Chief Concern: Nausea, vomiting, diarrhea  HPI: [name] is a 47 year old male with history of morbid obesity, hypertension, hyperlipidemia, hepatic steatosis, cholelithiasis, non-insulin-dependent diabetes mellitus, who presents to the emergency department for chief concerns of nausea, vomiting, diarrhea.  Vitals in the ED showed temperature 97.9, respiration rate of 18, heart rate 77, blood pressure 135/77, SpO2 98% on room air.  Serum sodium is 135, potassium 3.8, chloride 104, bicarb 20, BUN of 14, serum creatinine is 0.97, nonfasting blood glucose 150, EGFR greater than 60, WBC 11.7, hemoglobin 15.3, platelets of 332.  CT abdomen and pelvis with contrast: Was read as mild increase in distal small bowel dilatation with transition point in the right abdomen.  Consistent with small bowel obstruction, suspicious for adhesion.  Cholelithiasis, no radiographic evidence of cholecystitis.  Hepatic steatosis.  EDP consulted general surgery, Dr. Tonna Boehringer who is aware of the patient.  ED treatment: Ondansetron 4 mg IV one-time dose, sodium chloride 500 mL bolus. ------------------------------------------ At bedside, patient able to tell me his name, age, location, current, year  He denies trauma to his person. He vomited too many to count today.  He reports towards the end of the vomiting, he vomiting up green/yellow. He endorses diarrhea, he does not know the color of his stool is as he did not look.  He denies trauma to his person.  He denies chest pain, dysuria, syncope.  He endorses some shortness of breath in conjunction with the pain.  Social history: He uses smokeless pinches of tobacco per day. He rarely drinks etoh, last drink was months ago. He  denies recreational drug use. 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   ROS: Constitutional: no weight change, no fever ENT/Mouth: no sore throat, no rhinorrhea Eyes: no eye pain, no vision changes Cardiovascular: no chest pain, no dyspnea,  no edema, no palpitations Respiratory: no cough, no sputum, no wheezing Gastrointestinal: no nausea, no vomiting, no diarrhea, no constipation Genitourinary: no urinary incontinence, no dysuria, no hematuria Musculoskeletal: no arthralgias, no myalgias Skin: no skin lesions, no pruritus, Neuro: + weakness, no loss of consciousness, no syncope Psych: no anxiety, no depression, + decrease appetite Heme/Lymph: no bruising, no bleeding  ED Course: Discussed with emergency medicine provider, patient requiring hospitalization for chief concerns of small bowel obstruction.  Assessment/Plan  Principal Problem:   Small bowel obstruction (HCC) Active Problems:   Anxiety   Family history of coronary artery disease   History of cholelithiasis   OSA (obstructive sleep apnea)   Chronic intermittent hypoxia with obstructive sleep apnea   History of gout   Hyperlipidemia associated with type 2 diabetes mellitus (HCC)   Hypertension associated with diabetes (HCC)   Obesity, Class III, BMI 40-49.9 (morbid obesity) (HCC)   Diabetes mellitus type 2, noninsulin dependent (HCC)   Assessment and Plan:  * Small bowel obstruction (HCC) Suspect secondary to adhesions per CT read EDP placed order for NG tube placement, 16 French, maintain low intermittent suction Conservative management on admission Status post sodium chloride 500 mL bolus Lactated Ringer's infusion at 150 mL/h, 1 day ordered Symptomatic support: Acetaminophen 1000 mg IV every 6 hours as needed for mild pain, fever, headache 24 hours of coverage ordered; morphine 4 mg IV every 4 hours as needed for moderate pain, 20  hours of coverage ordered; fentanyl 12.5 mcg IV every 4 hours as needed for severe pain, 15 hours of  coverage ordered Admit to telemetry medical, inpatient  Diabetes mellitus type 2, noninsulin dependent (HCC) Insulin SSI with at bedtime coverage ordered, renal dosing in setting of n.p.o. state on admission AM team to change insulin SSI coverage as appropriate  Obesity, Class III, BMI 40-49.9 (morbid obesity) (HCC) This complicates overall care and prognosis.   Hypertension associated with diabetes (HCC) Hydralazine 5 mg IV every 6 hours as needed for SBP greater than 170, 4 days ordered  Hyperlipidemia associated with type 2 diabetes mellitus (HCC) Home atorvastatin 20 mg daily not resumed on admission.  AM team to resume when benefits outweigh the risk  OSA (obstructive sleep apnea) CPAP nightly ordered  Anxiety As needed Ativan 1 mg IV every 6 hours as needed for anxiety, 15 hours of coverage ordered  Chart reviewed.   DVT prophylaxis: TED hose; AM team to initiate pharmacologic DVT prophylaxis when the benefits outweigh the risk Code Status: full code Diet: N.p.o. except for sips with meds and ice chips Family Communication: patient declined stating he already updated everyone Disposition Plan: Pending clinical course Consults called: EDP consulted general surgery Admission status: Telemetry medical, inpatient  Past Medical History:  Diagnosis Date   Diabetes mellitus without complication (HCC)    Gall bladder inflammation    Hypertension    Obstructive sleep apnea    Past Surgical History:  Procedure Laterality Date   ADENOIDECTOMY     Social History:  reports that he quit smoking about 23 years ago. His smoking use included cigarettes. He started smoking about 23 years ago. He has a 0.1 pack-year smoking history. He quit smokeless tobacco use about 4 years ago.  His smokeless tobacco use included snuff. He reports current alcohol use of about 1.0 standard drink of alcohol per week. He reports that he does not use drugs.  No Known Allergies Family History  Problem  Relation Age of Onset   Diabetes Mother    Heart attack Father 98   Heart attack Maternal Grandfather    Diabetes Maternal Grandfather    Family history: Family history reviewed and not pertinent.  Prior to Admission medications   Medication Sig Start Date End Date Taking? Authorizing Provider  aspirin EC 81 MG tablet Take 81 mg by mouth daily. Swallow whole.    [provider]  fenofibrate (TRICOR) 145 MG tablet Take 1 tablet (145 mg total) by mouth daily. 06/02/22   Steve Kindle, FNP  losartan-hydrochlorothiazide (HYZAAR) 100-25 MG tablet Take 1 tablet by mouth daily. Contains both medications 07/01/22   Steve Kindle, FNP  metFORMIN (GLUCOPHAGE-XR) 500 MG 24 hr tablet Take 2 tablets (1,000 mg total) by mouth 2 (two) times daily with a meal. 06/02/22   Steve Kindle, FNP  rosuvastatin (CRESTOR) 20 MG tablet Take 1 tablet (20 mg total) by mouth daily. 06/02/22   Steve Kindle, FNP  Vitamin D, Ergocalciferol, (DRISDOL) 1.25 MG (50000 UNIT) CAPS capsule Take 1 capsule (50,000 Units total) by mouth every 7 (seven) days. 06/02/22   Steve Kindle, FNP   Physical Exam: Vitals:   11/20/22 1227 11/20/22 1229  BP:  135/77  Pulse:  77  Resp:  18  Temp:  97.9 F (36.6 C)  TempSrc:  Oral  SpO2:  98%  Weight: (!) 154.2 kg   Height: 5\' 10"  (1.778 m)    Constitutional: appears older than chronological age,  in acute pain, uncomfortable in bed Eyes: PERRL, lids and conjunctivae normal ENMT: Mucous membranes are dry. Posterior pharynx clear of any exudate or lesions. Age-appropriate dentition. Hearing appropriate Neck: normal, supple, no masses, no thyromegaly Respiratory: clear to auscultation bilaterally, no wheezing, no crackles. Normal respiratory effort. No accessory muscle use.  Cardiovascular: Regular rate and rhythm, no murmurs / rubs / gallops. No extremity edema. 2+ pedal pulses. No carotid bruits.  Abdomen: Morbid obese abdomen, + generalized tenderness, no masses palpated, no  hepatosplenomegaly. Bowel sounds positive.  Musculoskeletal: no clubbing / cyanosis. No joint deformity upper and lower extremities. Good ROM, no contractures, no atrophy. Normal muscle tone.  Skin: no rashes, lesions, ulcers. No induration Neurologic: Sensation intact. Strength 5/5 in all 4.  Psychiatric: Normal judgment and insight. Alert and oriented x 3. Normal mood.   EKG: Not indicated at this time  Chest x-ray on Admission: Not indicated at this time  CT ABDOMEN PELVIS W CONTRAST  Result Date: 11/20/2022 CLINICAL DATA:  Left lower quadrant pain. Nausea and vomiting. Diarrhea. Cholelithiasis. EXAM: CT ABDOMEN AND PELVIS WITH CONTRAST TECHNIQUE: Multidetector CT imaging of the abdomen and pelvis was performed using the standard protocol following bolus administration of intravenous contrast. RADIATION DOSE REDUCTION: This exam was performed according to the departmental dose-optimization program which includes automated exposure control, adjustment of the mA and/or kV according to patient size and/or use of iterative reconstruction technique. CONTRAST:  75mL OMNIPAQUE IOHEXOL 350 MG/ML SOLN COMPARISON:  08/04/2022 FINDINGS: Lower Chest: No acute findings. Hepatobiliary: No suspicious hepatic masses identified. Mild-to-moderate diffuse steatosis is seen, with focal fatty sparing in the central left lobe. Gallstones are seen, however there is no evidence of cholecystitis or biliary dilatation. Pancreas:  No mass or inflammatory changes. Spleen: Within normal limits in size and appearance. Adrenals/Urinary Tract: No suspicious masses identified. No evidence of ureteral calculi or hydronephrosis. Stomach/Bowel: Moderately dilated distal small bowel loops with air-fluid levels show mild increase since previous study. Transition point is seen in the right abdomen on image 62/2 consistent with small-bowel obstruction. A beaked appearance this is suspicious for adhesion. No evidence of inflammatory process,  mass, or abnormal fluid collections. Vascular/Lymphatic: No pathologically enlarged lymph nodes. No acute vascular findings. Reproductive:  No mass or other significant abnormality. Other:  None. Musculoskeletal:  No suspicious bone lesions identified. IMPRESSION: Mild increase in distal small bowel dilatation with transition point in right abdomen. This is consistent with small-bowel obstruction, suspicious for adhesion. Cholelithiasis. No radiographic evidence of cholecystitis. Hepatic steatosis. Electronically Signed   By: Danae Orleans M.D.   On: 11/20/2022 15:27    Labs on Admission: I have personally reviewed following labs  CBC: Recent Labs  Lab 11/20/22 1230  WBC 11.7*  HGB 15.3  HCT 46.9  MCV 85.3  PLT 332   Basic Metabolic Panel: Recent Labs  Lab 11/20/22 1230  NA 135  K 3.8  CL 104  CO2 20*  GLUCOSE 150*  BUN 14  CREATININE 0.97  CALCIUM 9.5   GFR: Estimated Creatinine Clearance: 142 mL/min (by C-G formula based on SCr of 0.97 mg/dL).  Liver Function Tests: Recent Labs  Lab 11/20/22 1230  AST 34  ALT 41  ALKPHOS 49  BILITOT 1.1  PROT 8.6*  ALBUMIN 4.3   Recent Labs  Lab 11/20/22 1230  LIPASE 36   Urine analysis:    Component Value Date/Time   BILIRUBINUR negative 04/22/2019 1045   PROTEINUR Positive (A) 04/22/2019 1045   UROBILINOGEN 0.2 04/22/2019 1045  NITRITE negative 04/22/2019 1045   LEUKOCYTESUR Negative 04/22/2019 1045   This document was prepared using Dragon Voice Recognition software and may include unintentional dictation errors.  Dr. Sedalia Muta [facility]  If 7PM-7AM, please contact overnight-coverage provider If 7AM-7PM, please contact day attending provider [email or web address]  11/20/2022, 6:52 PM

## 2022-11-20 NOTE — ED Triage Notes (Signed)
Patient states upper abdominal pain, N/V/D since this morning; history of gallstones.

## 2022-11-20 NOTE — Assessment & Plan Note (Signed)
 Blood pressure mildly elevated. -Patient was started on low-dose Entresto and carvedilol -Continue with IV Lasix-most likely will switch to p.o. -Cardiology is planning to start spironolactone

## 2022-11-20 NOTE — Hospital Course (Addendum)
Mr. Khary Sielski II is a 47 year old male with history of morbid obesity, hypertension, hyperlipidemia, hepatic steatosis, cholelithiasis, non-insulin-dependent diabetes mellitus, who presents to the emergency department for chief concerns of nausea, vomiting, diarrhea.  Vitals in the ED showed temperature 97.9, respiration rate of 18, heart rate 77, blood pressure 135/77, SpO2 98% on room air.  Serum sodium is 135, potassium 3.8, chloride 104, bicarb 20, BUN of 14, serum creatinine is 0.97, nonfasting blood glucose 150, EGFR greater than 60, WBC 11.7, hemoglobin 15.3, platelets of 332.  CT abdomen and pelvis with contrast: Was read as mild increase in distal small bowel dilatation with transition point in the right abdomen.  Consistent with small bowel obstruction, suspicious for adhesion.  Cholelithiasis, no radiographic evidence of cholecystitis.  Hepatic steatosis.  EDP consulted general surgery, Dr. Tonna Boehringer who is aware of the patient.  ED treatment: Ondansetron 4 mg IV one-time dose, sodium chloride 500 mL bolus.

## 2022-11-21 ENCOUNTER — Inpatient Hospital Stay: Payer: Self-pay

## 2022-11-21 LAB — CBC
HCT: 45 % (ref 39.0–52.0)
Hemoglobin: 14.6 g/dL (ref 13.0–17.0)
MCH: 27.9 pg (ref 26.0–34.0)
MCHC: 32.4 g/dL (ref 30.0–36.0)
MCV: 86 fL (ref 80.0–100.0)
Platelets: 363 10*3/uL (ref 150–400)
RBC: 5.23 MIL/uL (ref 4.22–5.81)
RDW: 14 % (ref 11.5–15.5)
WBC: 10 10*3/uL (ref 4.0–10.5)
nRBC: 0 % (ref 0.0–0.2)

## 2022-11-21 LAB — BASIC METABOLIC PANEL
Anion gap: 13 (ref 5–15)
BUN: 13 mg/dL (ref 6–20)
CO2: 24 mmol/L (ref 22–32)
Calcium: 9.3 mg/dL (ref 8.9–10.3)
Chloride: 105 mmol/L (ref 98–111)
Creatinine, Ser: 0.91 mg/dL (ref 0.61–1.24)
GFR, Estimated: >60 mL/min (ref >60)
Glucose, Bld: 148 mg/dL — ABNORMAL HIGH (ref 70–99)
Potassium: 4.1 mmol/L (ref 3.5–5.1)
Sodium: 138 mmol/L (ref 135–145)

## 2022-11-21 LAB — GLUCOSE, CAPILLARY
Glucose-Capillary: 152 mg/dL — ABNORMAL HIGH (ref 70–99)
Glucose-Capillary: 117 mg/dL — ABNORMAL HIGH (ref 70–99)

## 2022-11-21 MED ORDER — PHENOL 1.4 % MT LIQD
1.0000 | OROMUCOSAL | Status: DC | PRN
  Filled 2022-11-21: qty 177

## 2022-11-21 MED ORDER — ONDANSETRON HCL 4 MG PO TABS: 4.0000 mg | ORAL_TABLET | Freq: Four times a day (QID) | ORAL | 0 refills | Status: AC | PRN

## 2022-11-21 MED ORDER — LOSARTAN POTASSIUM-HCTZ 100-25 MG PO TABS: 0.5000 | ORAL_TABLET | Freq: Every day | ORAL | 1 refills | Status: DC

## 2022-11-21 MED ORDER — SENNOSIDES-DOCUSATE SODIUM 8.6-50 MG PO TABS: 1.0000 | ORAL_TABLET | Freq: Every evening | ORAL | Status: AC | PRN

## 2022-11-21 NOTE — TOC CM/SW Note (Signed)
Transition of Care [facility]) - Inpatient Brief Assessment   Patient Details  Name: Steve [name] MRN: [id number] Date of Birth: 1976-03-12  Transition of Care [facility]) CM/SW Contact:    Chapman Fitch, RN Phone Number: 11/21/2022, 12:39 PM   Clinical Narrative:   Transition of Care [facility]) Screening Note   Patient Details  Name: Steve [name] Date of Birth: 08-Jul-1975   Transition of Care [facility]) CM/SW Contact:    Chapman Fitch, RN Phone Number: 11/21/2022, 12:39 PM    Transition of Care Department [facility]) has reviewed patient and no TOC needs have been identified at this time. We will continue to monitor patient advancement through interdisciplinary progression rounds. If new patient transition needs arise, please place a TOC consult.    Transition of Care Asessment: Insurance and Status: Insurance coverage has been reviewed Patient has primary care physician: Yes     Prior/Current Home Services: No current home services Social Determinants of Health Reivew: SDOH reviewed no interventions necessary Readmission risk has been reviewed: Yes Transition of care needs: no transition of care needs at this time

## 2022-11-21 NOTE — Discharge Summary (Signed)
Physician Discharge Summary   Patient: Steve [name] MRN: [id number]  DOB: 21-Jun-1975   Admit:     Date of Admission: 11/20/2022 Admitted from: home   Discharge: Date of discharge: 11/21/22 Disposition: Home Condition at discharge: good  CODE STATUS: FULL CODE     Discharge Physician: Sunnie Nielsen, DO [facility]     PCP: Jacky Kindle, FNP  Recommendations for Outpatient Follow-up:  Follow up with PCP Jacky Kindle, FNP in 1-2 weeks Please obtain labs/tests: consider CBC, CMP in 1-2 weeks / as needed  Please follow up on the following pending results: none   Discharge Instructions     Call MD for:  severe uncontrolled pain   Complete by: As directed    Diet Carb Modified   Complete by: As directed    Increase activity slowly   Complete by: As directed          Discharge Diagnoses: Principal Problem:   Small bowel obstruction (HCC) Active Problems:   Anxiety   Family history of coronary artery disease   History of cholelithiasis   OSA (obstructive sleep apnea)   Chronic intermittent hypoxia with obstructive sleep apnea   History of gout   Hyperlipidemia associated with type 2 diabetes mellitus (HCC)   Hypertension associated with diabetes (HCC)   Obesity, Class III, BMI 40-49.9 (morbid obesity) (HCC)   Diabetes mellitus type 2, noninsulin dependent [facility])       Hospital Course:  [name] is a 47 year old male with history of morbid obesity, hypertension, hyperlipidemia, hepatic steatosis, cholelithiasis, non-insulin-dependent diabetes mellitus, who presents to the emergency department for chief concerns of nausea, vomiting, diarrhea. He vomited too many to count today.  He reports towards the end of the vomiting, he vomiting up green/yellow. He endorses diarrhea, he does not know the color of his stool is as he did not look  08/25: to ED. CT abdomen and pelvis with contrast: Was read as mild increase in distal small  bowel dilatation with transition point in the right abdomen. Consistent with small bowel obstruction, suspicious for adhesion. General surgery consulted. NG placed to low intermittent suction.  08/26: small bowel protocol XR (+)contrast colon/rectum, NG removed, advance diet as able. Surgery s/o. This afternoon, tolerating diet. (+)loose BM. Pt asking re: discharge and I am comfortable w/ him going home w/ strict return to ED if severe pain / severe nausea vomiting    Consultants:  General surgery   Procedures: none      ASSESSMENT & PLAN:   Small bowel obstruction (HCC) - resolved Suspect secondary to adhesions per CT read NG removed this morning  Advance diet as able General surgery has s/o   Diabetes mellitus type 2, noninsulin dependent (HCC) Last A1C on file 08/2022 was 7.6 Restart metformin  Hypertension restart home Hyzaar for now half dose starting tomorrow and increase to full tablet if BP still over 140 systolic    Hyperlipidemia associated with type 2 diabetes mellitus (HCC) restart statin and fenofibrate   OSA (obstructive sleep apnea) CPAP nightly ordered   Anxiety Follow outpatient   Obesity, Class III, BMI 40-49.9 (morbid obesity) (HCC) This complicates overall care and prognosis.          Discharge Instructions  Allergies as of 11/21/2022   No Known Allergies      Medication List     TAKE these medications    aspirin EC 81 MG tablet Take 81 mg by mouth daily.  Swallow whole.   fenofibrate 145 MG tablet Commonly known as: Tricor Take 1 tablet (145 mg total) by mouth daily.   losartan-hydrochlorothiazide 100-25 MG tablet Commonly known as: HYZAAR Take 0.5 tablets by mouth daily. Restart 11/22/22 as long as to[ number on BP higher than 140. Can resume 1 whole tablet if/when BP top number still higher than 140 after 3-4 days on half tablet What changed:  how much to take additional instructions   metFORMIN 500 MG 24 hr  tablet Commonly known as: GLUCOPHAGE-XR Take 2 tablets (1,000 mg total) by mouth 2 (two) times daily with a meal.   ondansetron 4 MG tablet Commonly known as: ZOFRAN Take 1 tablet (4 mg total) by mouth every 6 (six) hours as needed for nausea.   rosuvastatin 20 MG tablet Commonly known as: Crestor Take 1 tablet (20 mg total) by mouth daily.   senna-docusate 8.6-50 MG tablet Commonly known as: Senokot-S Take 1 tablet by mouth at bedtime as needed for mild constipation.   Vitamin D (Ergocalciferol) 1.25 MG (50000 UNIT) Caps capsule Commonly known as: DRISDOL Take 1 capsule (50,000 Units total) by mouth every 7 (seven) days.         Follow-up Information     Jacky Kindle, FNP. Schedule an appointment as soon as possible for a visit.   Specialty: Family Medicine Why: hospital follow up in 1-2 weeks Contact information: [address] [place] [address] [phone]                 No Known Allergies   Subjective: pt reports feeling much better this mornin/this afternoon, tolerated solid foods for lunch, no nausea, some loose stool but no pain    Discharge Exam: BP 133/75 (BP Location: Left Arm)   Pulse 84   Temp 98 F (36.7 C)   Resp 18   Ht 5\' 10"  (1.778 m)   Wt (!) 154.2 kg   SpO2 (!) 86%   BMI 48.78 kg/m  General: Pt is alert, awake, not in acute distress Cardiovascular: RRR, S1/S2 +, no rubs, no gallops Respiratory: CTA bilaterally, no wheezing, no rhonchi Abdominal: Soft, NT, ND, bowel sounds +WNL Extremities: no edema, no cyanosis     The results of significant diagnostics from this hospitalization (including imaging, microbiology, ancillary and laboratory) are listed below for reference.     Microbiology: No results found for this or any previous visit (from the past 240 hour(s)).   Labs: BNP (last 3 results) No results for input(s): "BNP" in the last 8760 hours. Basic Metabolic Panel: Recent Labs  Lab 11/20/22 1230  11/21/22 0423  NA 135 138  K 3.8 4.1  CL 104 105  CO2 20* 24  GLUCOSE 150* 148*  BUN 14 13  CREATININE 0.97 0.91  CALCIUM 9.5 9.3   Liver Function Tests: Recent Labs  Lab 11/20/22 1230  AST 34  ALT 41  ALKPHOS 49  BILITOT 1.1  PROT 8.6*  ALBUMIN 4.3   Recent Labs  Lab 11/20/22 1230  LIPASE 36   No results for input(s): "AMMONIA" in the last 168 hours. CBC: Recent Labs  Lab 11/20/22 1230 11/21/22 0423  WBC 11.7* 10.0  HGB 15.3 14.6  HCT 46.9 45.0  MCV 85.3 86.0  PLT 332 363   Cardiac Enzymes: No results for input(s): "CKTOTAL", "CKMB", "CKMBINDEX", "TROPONINI" in the last 168 hours. BNP: Invalid input(s): "POCBNP" CBG: Recent Labs  Lab 11/20/22 2212 11/21/22 0837 11/21/22 1154  GLUCAP 161* 152* 117*  D-Dimer No results for input(s): "DDIMER" in the last 72 hours. Hgb A1c No results for input(s): "HGBA1C" in the last 72 hours. Lipid Profile No results for input(s): "CHOL", "HDL", "LDLCALC", "TRIG", "CHOLHDL", "LDLDIRECT" in the last 72 hours. Thyroid function studies No results for input(s): "TSH", "T4TOTAL", "T3FREE", "THYROIDAB" in the last 72 hours.  Invalid input(s): "FREET3" Anemia work up No results for input(s): "VITAMINB12", "FOLATE", "FERRITIN", "TIBC", "IRON", "RETICCTPCT" in the last 72 hours. Urinalysis    Component Value Date/Time   COLORURINE YELLOW (A) 11/20/2022 2030   APPEARANCEUR CLEAR (A) 11/20/2022 2030   LABSPEC >1.046 (H) 11/20/2022 2030   PHURINE 5.0 11/20/2022 2030   GLUCOSEU NEGATIVE 11/20/2022 2030   HGBUR NEGATIVE 11/20/2022 2030   BILIRUBINUR NEGATIVE 11/20/2022 2030   BILIRUBINUR negative 04/22/2019 1045   KETONESUR NEGATIVE 11/20/2022 2030   PROTEINUR 30 (A) 11/20/2022 2030   UROBILINOGEN 0.2 04/22/2019 1045   NITRITE NEGATIVE 11/20/2022 2030   LEUKOCYTESUR NEGATIVE 11/20/2022 2030   Sepsis Labs Recent Labs  Lab 11/20/22 1230 11/21/22 0423  WBC 11.7* 10.0   Microbiology No results found for this or  any previous visit (from the past 240 hour(s)). Imaging DG Abd Portable 1V-Small Bowel Obstruction Protocol-initial, 8 hr delay  Result Date: 11/21/2022 CLINICAL DATA:  8 hour delay, small bowel protocol EXAM: PORTABLE ABDOMEN - 1 VIEW COMPARISON:  Abdominal radiograph and CT abdomen/pelvis dated 11/20/2022. FINDINGS: Enteric tube terminates in the proximal gastric body with associated contrast. Colon is not decompressed. Contrast opacifies the left colon to the rectum. Degenerative changes of the lumbar spine. IMPRESSION: Enteric tube terminates in the proximal gastric body. Contrast opacifies the left colon to the rectum. Electronically Signed   By: Charline Bills M.D.   On: 11/21/2022 08:07   DG Abd Portable 1V-Small Bowel Protocol-Position Verification  Result Date: 11/20/2022 CLINICAL DATA:  Check gastric catheter placement EXAM: PORTABLE ABDOMEN - 1 VIEW COMPARISON:  None Available. FINDINGS: Gastric catheter is noted within the stomach.  No free air is seen. IMPRESSION: Gastric catheter within the stomach. Electronically Signed   By: Alcide Clever M.D.   On: 11/20/2022 21:47   CT ABDOMEN PELVIS W CONTRAST  Result Date: 11/20/2022 CLINICAL DATA:  Left lower quadrant pain. Nausea and vomiting. Diarrhea. Cholelithiasis. EXAM: CT ABDOMEN AND PELVIS WITH CONTRAST TECHNIQUE: Multidetector CT imaging of the abdomen and pelvis was performed using the standard protocol following bolus administration of intravenous contrast. RADIATION DOSE REDUCTION: This exam was performed according to the departmental dose-optimization program which includes automated exposure control, adjustment of the mA and/or kV according to patient size and/or use of iterative reconstruction technique. CONTRAST:  75mL OMNIPAQUE IOHEXOL 350 MG/ML SOLN COMPARISON:  08/04/2022 FINDINGS: Lower Chest: No acute findings. Hepatobiliary: No suspicious hepatic masses identified. Mild-to-moderate diffuse steatosis is seen, with focal fatty  sparing in the central left lobe. Gallstones are seen, however there is no evidence of cholecystitis or biliary dilatation. Pancreas:  No mass or inflammatory changes. Spleen: Within normal limits in size and appearance. Adrenals/Urinary Tract: No suspicious masses identified. No evidence of ureteral calculi or hydronephrosis. Stomach/Bowel: Moderately dilated distal small bowel loops with air-fluid levels show mild increase since previous study. Transition point is seen in the right abdomen on image 62/2 consistent with small-bowel obstruction. A beaked appearance this is suspicious for adhesion. No evidence of inflammatory process, mass, or abnormal fluid collections. Vascular/Lymphatic: No pathologically enlarged lymph nodes. No acute vascular findings. Reproductive:  No mass or other significant abnormality. Other:  None. Musculoskeletal:  No suspicious bone lesions identified. IMPRESSION: Mild increase in distal small bowel dilatation with transition point in right abdomen. This is consistent with small-bowel obstruction, suspicious for adhesion. Cholelithiasis. No radiographic evidence of cholecystitis. Hepatic steatosis. Electronically Signed   By: Danae Orleans M.D.   On: 11/20/2022 15:27      Time coordinating discharge: over 30 minutes  SIGNED:  Sunnie Nielsen DO [facility]

## 2022-11-21 NOTE — Plan of Care (Signed)
Problem: Education: Goal: Ability to describe self-care measures that may prevent or decrease complications (Diabetes Survival Skills Education) will improve 11/21/2022 1541 by Murriel Hopper, Donald Pore, RN Outcome: Completed/Met 11/21/2022 1438 by Murriel Hopper, Donald Pore, RN Outcome: Progressing Goal: Individualized Educational Video(s) 11/21/2022 1541 by Murriel Hopper, Donald Pore, RN Outcome: Completed/Met 11/21/2022 1438 by Murriel Hopper, Donald Pore, RN Outcome: Progressing   Problem: Coping: Goal: Ability to adjust to condition or change in health will improve 11/21/2022 1541 by Murriel Hopper, Donald Pore, RN Outcome: Completed/Met 11/21/2022 1438 by Murriel Hopper, Donald Pore, RN Outcome: Progressing   Problem: Fluid Volume: Goal: Ability to maintain a balanced intake and output will improve 11/21/2022 1541 by Murriel Hopper, Donald Pore, RN Outcome: Completed/Met 11/21/2022 1438 by Murriel Hopper, Donald Pore, RN Outcome: Progressing   Problem: Health Behavior/Discharge Planning: Goal: Ability to identify and utilize available resources and services will improve 11/21/2022 1541 by Murriel Hopper, Donald Pore, RN Outcome: Completed/Met 11/21/2022 1438 by Murriel Hopper, Donald Pore, RN Outcome: Progressing Goal: Ability to manage health-related needs will improve 11/21/2022 1541 by Murriel Hopper, Donald Pore, RN Outcome: Completed/Met 11/21/2022 1438 by Murriel Hopper, Donald Pore, RN Outcome: Progressing   Problem: Metabolic: Goal: Ability to maintain appropriate glucose levels will improve 11/21/2022 1541 by Murriel Hopper, Donald Pore, RN Outcome: Completed/Met 11/21/2022 1438 by Murriel Hopper, Donald Pore, RN Outcome: Progressing   Problem: Nutritional: Goal: Maintenance of adequate nutrition will improve 11/21/2022 1541 by Murriel Hopper, Donald Pore, RN Outcome: Completed/Met 11/21/2022 1438 by Murriel Hopper, Donald Pore, RN Outcome: Progressing Goal: Progress toward achieving an optimal weight will  improve 11/21/2022 1541 by Murriel Hopper, Donald Pore, RN Outcome: Completed/Met 11/21/2022 1438 by Murriel Hopper, Donald Pore, RN Outcome: Progressing   Problem: Skin Integrity: Goal: Risk for impaired skin integrity will decrease 11/21/2022 1541 by Murriel Hopper, Donald Pore, RN Outcome: Completed/Met 11/21/2022 1438 by Murriel Hopper, Donald Pore, RN Outcome: Progressing   Problem: Tissue Perfusion: Goal: Adequacy of tissue perfusion will improve 11/21/2022 1541 by Murriel Hopper, Donald Pore, RN Outcome: Completed/Met 11/21/2022 1438 by Murriel Hopper, Donald Pore, RN Outcome: Progressing   Problem: Education: Goal: Knowledge of General Education information will improve Description: Including pain rating scale, medication(s)/side effects and non-pharmacologic comfort measures 11/21/2022 1541 by Murriel Hopper, Donald Pore, RN Outcome: Completed/Met 11/21/2022 1438 by Murriel Hopper, Donald Pore, RN Outcome: Progressing   Problem: Health Behavior/Discharge Planning: Goal: Ability to manage health-related needs will improve 11/21/2022 1541 by Murriel Hopper, Donald Pore, RN Outcome: Completed/Met 11/21/2022 1438 by Murriel Hopper, Donald Pore, RN Outcome: Progressing   Problem: Clinical Measurements: Goal: Ability to maintain clinical measurements within normal limits will improve 11/21/2022 1541 by Murriel Hopper, Donald Pore, RN Outcome: Completed/Met 11/21/2022 1438 by Murriel Hopper, Donald Pore, RN Outcome: Progressing Goal: Will remain free from infection 11/21/2022 1541 by Murriel Hopper, Donald Pore, RN Outcome: Completed/Met 11/21/2022 1438 by Murriel Hopper, Donald Pore, RN Outcome: Progressing Goal: Diagnostic test results will improve 11/21/2022 1541 by Monica Becton, RN Outcome: Completed/Met 11/21/2022 1438 by Murriel Hopper, Donald Pore, RN Outcome: Progressing Goal: Respiratory complications will improve 11/21/2022 1541 by Monica Becton, RN Outcome: Completed/Met 11/21/2022 1438 by Murriel Hopper, Donald Pore, RN Outcome: Progressing Goal: Cardiovascular complication will be avoided 11/21/2022 1541 by Monica Becton, RN Outcome: Completed/Met 11/21/2022 1438 by Monica Becton, RN Outcome: Progressing   Problem: Activity: Goal: Risk for activity intolerance will decrease 11/21/2022 1541 by Murriel Hopper, Donald Pore, RN Outcome: Completed/Met 11/21/2022 1438 by Murriel Hopper, Donald Pore, RN Outcome: Progressing   Problem: Nutrition: Goal: Adequate nutrition will be maintained 11/21/2022 1541 by Murriel Hopper, Donald Pore, RN Outcome: Completed/Met 11/21/2022 1438 by Murriel Hopper, Donald Pore, RN Outcome: Progressing  Problem: Coping: Goal: Level of anxiety will decrease 11/21/2022 1541 by Murriel Hopper, Donald Pore, RN Outcome: Completed/Met 11/21/2022 1438 by Murriel Hopper, Donald Pore, RN Outcome: Progressing   Problem: Pain Managment: Goal: General experience of comfort will improve 11/21/2022 1541 by Murriel Hopper, Donald Pore, RN Outcome: Completed/Met 11/21/2022 1438 by Murriel Hopper, Donald Pore, RN Outcome: Progressing   Problem: Safety: Goal: Ability to remain free from injury will improve 11/21/2022 1541 by Monica Becton, RN Outcome: Completed/Met 11/21/2022 1438 by Monica Becton, RN Outcome: Progressing

## 2022-11-21 NOTE — Progress Notes (Signed)
Subjective:  CC: Steve [name] is a 47 y.o. male  Hospital stay day 1,   SBO  HPI: No acute complaints overnight.  Pain better, nausea resolved, and continues to have diarrhea.  ROS:  General: Denies weight loss, weight gain, fatigue, fevers, chills, and night sweats. Heart: Denies chest pain, palpitations, racing heart, irregular heartbeat, leg pain or swelling, and decreased activity tolerance. Respiratory: Denies breathing difficulty, shortness of breath, wheezing, cough, and sputum. GI: Denies change in appetite, heartburn, nausea, vomiting, constipation, and blood in stool. GU: Denies difficulty urinating, pain with urinating, urgency, frequency, blood in urine.   Objective:   Temp:  [97.7 F (36.5 C)-98.9 F (37.2 C)] 98.9 F (37.2 C) (08/26 0548) Pulse Rate:  [59-77] 72 (08/26 0548) Resp:  [18-20] 20 (08/26 0548) BP: (127-150)/(75-91) 127/75 (08/26 0548) SpO2:  [94 %-98 %] 96 % (08/26 0548) Weight:  [154.2 kg] 154.2 kg (08/25 1227)     Height: 5\' 10"  (177.8 cm) Weight: (!) 154.2 kg BMI (Calculated): 48.78   Intake/Output this shift:   Intake/Output Summary (Last 24 hours) at 11/21/2022 0815 Last data filed at 11/21/2022 0411 Gross per 24 hour  Intake 1310.27 ml  Output 800 ml  Net 510.27 ml    Constitutional :  alert, cooperative, appears stated age, and no distress  Respiratory:  clear to auscultation bilaterally  Cardiovascular:  regular rate and rhythm  Gastrointestinal: soft, non-tender; bowel sounds normal; no masses,  no organomegaly. NG bilious  Skin: Cool and moist.   Psychiatric: Normal affect, non-agitated, not confused       LABS:     Latest Ref Rng & Units 11/21/2022    4:23 AM 11/20/2022   12:30 PM 08/04/2022   10:37 AM  CMP  Glucose 70 - 99 mg/dL 425  956  387   BUN 6 - 20 mg/dL 13  14  16    Creatinine 0.61 - 1.24 mg/dL 5.64  3.32  9.51   Sodium 135 - 145 mmol/L 138  135  136   Potassium 3.5 - 5.1 mmol/L 4.1  3.8  4.1   Chloride 98 - 111  mmol/L 105  104  104   CO2 22 - 32 mmol/L 24  20  25    Calcium 8.9 - 10.3 mg/dL 9.3  9.5  8.9   Total Protein 6.5 - 8.1 g/dL  8.6  7.5   Total Bilirubin 0.3 - 1.2 mg/dL  1.1  1.0   Alkaline Phos 38 - 126 U/L  49  48   AST 15 - 41 U/L  34  22   ALT 0 - 44 U/L  41  30       Latest Ref Rng & Units 11/21/2022    4:23 AM 11/20/2022   12:30 PM 08/04/2022   10:37 AM  CBC  WBC 4.0 - 10.5 K/uL 10.0  11.7  9.2   Hemoglobin 13.0 - 17.0 g/dL 88.4  16.6  06.3   Hematocrit 39.0 - 52.0 % 45.0  46.9  46.8   Platelets 150 - 400 K/uL 363  332  322     RADS: CLINICAL DATA:  8 hour delay, small bowel protocol   EXAM: PORTABLE ABDOMEN - 1 VIEW   COMPARISON:  Abdominal radiograph and CT abdomen/pelvis dated 11/20/2022.   FINDINGS: Enteric tube terminates in the proximal gastric body with associated contrast.   Colon is not decompressed. Contrast opacifies the left colon to the rectum.   Degenerative changes of the lumbar spine.  IMPRESSION: Enteric tube terminates in the proximal gastric body.   Contrast opacifies the left colon to the rectum.     Electronically Signed   By: Charline Bills M.D.   On: 11/21/2022 08:07   Assessment:   SBO-resolved.  Ok to remove tube and advance diet as tolerated.  No need for f/u from surgery standpoint.  Call with any questions.  labs/images/medications/previous chart entries reviewed personally and relevant changes/updates noted above.

## 2022-11-21 NOTE — Plan of Care (Signed)

## 2022-11-21 NOTE — Progress Notes (Signed)
 The patient has been discharged. IV has been removed. Education has been completed using the teach back method.

## 2022-12-02 ENCOUNTER — Ambulatory Visit (INDEPENDENT_AMBULATORY_CARE_PROVIDER_SITE_OTHER): Payer: BC Managed Care – PPO | Admitting: Family Medicine

## 2022-12-02 DIAGNOSIS — Z91199 Patient's noncompliance with other medical treatment and regimen due to unspecified reason: Secondary | ICD-10-CM

## 2022-12-02 NOTE — Progress Notes (Signed)
Patient was not seen for appt d/t no call, no show, or late arrival >10 mins past appt time.   Elise T Payne, FNP  [facility] [address] [place], [place] [address] [phone] (phone) [phone] (fax) [facility]  

## 2023-02-27 ENCOUNTER — Other Ambulatory Visit: Payer: Self-pay | Admitting: Family Medicine

## 2023-02-27 DIAGNOSIS — E1169 Type 2 diabetes mellitus with other specified complication: Secondary | ICD-10-CM

## 2023-02-27 DIAGNOSIS — E1159 Type 2 diabetes mellitus with other circulatory complications: Secondary | ICD-10-CM

## 2023-02-27 DIAGNOSIS — E559 Vitamin D deficiency, unspecified: Secondary | ICD-10-CM

## 2023-02-27 NOTE — Telephone Encounter (Signed)
Medication Refill -  Most Recent Primary Care Visit:  Provider: Merita Norton T  Department: [facility]  Visit Type: PHYSICAL  Date: 08/31/2022  Medication: metFORMIN (GLUCOPHAGE-XR) 500 MG 24 hr tablet   Has the patient contacted their pharmacy? No   Is this the correct pharmacy for this prescription? Yes If no, delete pharmacy and type the correct one.  This is the patient's preferred pharmacy:   [id number] [place], [place] - [address] AT [facility] OF [address] Phone: [phone]  Fax: [phone]     Please assist patient further  Has the prescription been filled recently? No  Is the patient out of the medication? No  Has the patient been seen for an appointment in the last year OR does the patient have an upcoming appointment? Yes  Can we respond through MyChart? Yes

## 2023-02-27 NOTE — Telephone Encounter (Signed)
Medication Refill -  Most Recent Primary Care Visit:  Provider: Merita Norton T  Department: [facility]  Visit Type: PHYSICAL  Date: 08/31/2022  Medication: losartan-hydrochlorothiazide (HYZAAR) 100-25 MG tablet   Has the patient contacted their pharmacy? No  Is this the correct pharmacy for this prescription? Yes  This is the patient's preferred pharmacy:  [facility] #[id number], [place] - [address] AT [facility] OF [address] Phone: [phone]  Fax: [phone]      Has the prescription been filled recently? Yes  Is the patient out of the medication? Yes  Has the patient been seen for an appointment in the last year OR does the patient have an upcoming appointment? Yes  Can we respond through MyChart? No  Agent: Please be advised that Rx refills may take up to 3 business days. We ask that you follow-up with your pharmacy.  Patient says he has not insurance but need his meds but he only has 2 pills left

## 2023-03-01 ENCOUNTER — Other Ambulatory Visit: Payer: Self-pay | Admitting: Family Medicine

## 2023-03-01 DIAGNOSIS — E1169 Type 2 diabetes mellitus with other specified complication: Secondary | ICD-10-CM

## 2023-03-01 DIAGNOSIS — E559 Vitamin D deficiency, unspecified: Secondary | ICD-10-CM

## 2023-03-01 DIAGNOSIS — E1159 Type 2 diabetes mellitus with other circulatory complications: Secondary | ICD-10-CM

## 2023-03-01 NOTE — Telephone Encounter (Signed)
Pt stated that he will on the road and requests that his prescriptions be sent to  [id number], [place] - [address] Phone: [phone]  Fax: [phone]      Medication Refill -  Most Recent Primary Care Visit:  Provider: Merita Norton T  Department: [facility]  Visit Type: PHYSICAL  Date: 08/31/2022  Medication: losartan-hydrochlorothiazide (HYZAAR) 100-25 MG tablet and metFORMIN (GLUCOPHAGE-XR) 500 MG 24 hr tablet  Has the patient contacted their pharmacy? Yes (Agent: If no, request that the patient contact the pharmacy for the refill. If patient does not wish to contact the pharmacy document the reason why and proceed with request.) (Agent: If yes, when and what did the pharmacy advise?)  Is this the correct pharmacy for this prescription? Yes If no, delete pharmacy and type the correct one.  This is the patient's preferred pharmacy: [facility] [address], [place] - [address] [place] [address] Phone: [phone] Fax: [phone]  Has the prescription been filled recently? Yes  Is the patient out of the medication? Yes  Has the patient been seen for an appointment in the last year OR does the patient have an upcoming appointment? Yes  Can we respond through MyChart? No  Agent: Please be advised that Rx refills may take up to 3 business days. We ask that you follow-up with your pharmacy.

## 2023-03-02 MED ORDER — METFORMIN HCL ER 500 MG PO TB24
1000.0000 mg | ORAL_TABLET | Freq: Two times a day (BID) | ORAL | 0 refills | Status: DC
Start: 2023-03-02 — End: 2023-06-05

## 2023-03-02 NOTE — Telephone Encounter (Signed)
Requested medications are due for refill today.  unsure  Requested medications are on the active medications list.  yes  Last refill. 11/21/2022 #90 1 rf  Future visit scheduled.   no  Notes to clinic.  Rx signed by Sunnie Nielsen.    Requested Prescriptions  Pending Prescriptions Disp Refills   losartan-hydrochlorothiazide (HYZAAR) 100-25 MG tablet 90 tablet 1    Sig: Take 0.5 tablets by mouth daily. Restart 11/22/22 as long as to[ number on BP higher than 140. Can resume 1 whole tablet if/when BP top number still higher than 140 after 3-4 days on half tablet     Cardiovascular: ARB + Diuretic Combos Passed - 02/27/2023  4:04 PM      Passed - K in normal range and within 180 days    Potassium  Date Value Ref Range Status  11/21/2022 4.1 3.5 - 5.1 mmol/L Final         Passed - Na in normal range and within 180 days    Sodium  Date Value Ref Range Status  11/21/2022 138 135 - 145 mmol/L Final  06/02/2022 143 134 - 144 mmol/L Final         Passed - Cr in normal range and within 180 days    Creatinine, Ser  Date Value Ref Range Status  11/21/2022 0.91 0.61 - 1.24 mg/dL Final         Passed - eGFR is 10 or above and within 180 days    GFR calc Af Amer  Date Value Ref Range Status  04/22/2019 123 >59 mL/min/1.73 Final   GFR, Estimated  Date Value Ref Range Status  11/21/2022 >60 >60 mL/min Final    Comment:    (NOTE) Calculated using the CKD-EPI Creatinine Equation (2021)    eGFR  Date Value Ref Range Status  06/02/2022 111 >59 mL/min/1.73 Final         Passed - Patient is not pregnant      Passed - Last BP in normal range    BP Readings from Last 1 Encounters:  11/21/22 133/75         Passed - Valid encounter within last 6 months    Recent Outpatient Visits           3 months ago No-show for appointment   [facility] Jacky Kindle, FNP   6 months ago Encounter for annual physical exam   [facility], F[facility]   8 months ago Hypertension associated with [facility])   [place] [facility] Mer[facility] Norton T, FNP   9 months ago Type 2 diabetes mellitus with morbid [facility])   [place] [facility] Merita Norton T, FNP   1 year ago Morbid obesity with BMI of 45.0-49.9, adult [facility])   [facility] Jacky Kindle, FNP

## 2023-03-02 NOTE — Telephone Encounter (Signed)
Requested medication (s) are due for refill today: yes needing sent to local pharmacy since pt is back in town  Requested medication (s) are on the active medication list: yes  Last refill:  11/21/22 #90/1  Future visit scheduled: no  Notes to clinic:  Unable to refill per protocol, last refill by another provider.  ED provider, needing sent to local pharmacy     Requested Prescriptions  Pending Prescriptions Disp Refills   losartan-hydrochlorothiazide (HYZAAR) 100-25 MG tablet 90 tablet 1    Sig: Take 0.5 tablets by mouth daily. Restart 11/22/22 as long as to[ number on BP higher than 140. Can resume 1 whole tablet if/when BP top number still higher than 140 after 3-4 days on half tablet     Cardiovascular: ARB + Diuretic Combos Failed - 03/01/2023  3:55 PM      Failed - Valid encounter within last 6 months    Recent Outpatient Visits           3 months ago No-show for appointment   [facility] T, FNP   6 months ago Encounter for annual physical exam   [facility] Merita Norton T, FNP   8 months ago Hypertension associated with diabetes [facility])   [facility] Merita Norton T, FNP   9 months ago Type 2 diabetes mellitus with morbid obesity [facility])   [place] [facility] Merita Norton T, FNP   1 year ago Morbid obesity with BMI of 45.0-49.9, adult [facility])   [place] [facility] Jacky Kindle, FNP              Passed - K in normal range and within 180 days    Potassium  Date Value Ref Range Status  11/21/2022 4.1 3.5 - 5.1 mmol/L Final         Passed - Na in normal range and within 180 days    Sodium  Date Value Ref Range Status  11/21/2022 138 135 - 145 mmol/L Final  06/02/2022 143 134 - 144 mmol/L Final         Passed - Cr in normal range and within 180 days    Creatinine, Ser  Date Value Ref Range Status  11/21/2022 0.91 0.61 - 1.24 mg/dL Final          Passed - eGFR is 10 or above and within 180 days    GFR calc Af Amer  Date Value Ref Range Status  04/22/2019 123 >59 mL/min/1.73 Final   GFR, Estimated  Date Value Ref Range Status  11/21/2022 >60 >60 mL/min Final    Comment:    (NOTE) Calculated using the CKD-EPI Creatinine Equation (2021)    eGFR  Date Value Ref Range Status  06/02/2022 111 >59 mL/min/1.73 Final         Passed - Patient is not pregnant      Passed - Last BP in normal range    BP Readings from Last 1 Encounters:  11/21/22 133/75         Signed Prescriptions Disp Refills   metFORMIN (GLUCOPHAGE-XR) 500 MG 24 hr tablet 360 tablet 0    Sig: Take 2 tablets (1,000 mg total) by mouth 2 (two) times daily with a meal.     Endocrinology:  Diabetes - Biguanides Failed - 03/01/2023  3:55 PM      Failed - HBA1C is between 0 and 7.9 and within 180 days  Hgb A1c MFr Bld  Date Value Ref Range Status  08/31/2022 7.6 (H) 4.8 - 5.6 % Final    Comment:             Prediabetes: 5.7 - 6.4          Diabetes: >6.4          Glycemic control for adults with diabetes: <7.0          Failed - B12 Level in normal range and within 720 days    No results found for: "VITAMINB12"       Failed - Valid encounter within last 6 months    Recent Outpatient Visits           3 months ago No-show for appointment   [facility] Mer[facility] Norton T, FNP   6 months ago Encounter for annual physical exam   [facility], FNP   8 months ago Hypertension associated with diabetes [facility])   [place] [facility] Mer[facility] Norton T, FNP   9 months ago Type 2 diabetes mellitus with morbid obesity (HCC)   [place] [facility] Merita Norton T, FNP   1 year ago Morbid obesity with BMI of 45.0-49.9, adult [facility])   [place] [facility] Jacky Kindle, FNP              Passed - Cr in normal range and within 360 days     Creatinine, Ser  Date Value Ref Range Status  11/21/2022 0.91 0.61 - 1.24 mg/dL Final         Passed - eGFR in normal range and within 360 days    GFR calc Af Amer  Date Value Ref Range Status  04/22/2019 123 >59 mL/min/1.73 Final   GFR, Estimated  Date Value Ref Range Status  11/21/2022 >60 >60 mL/min Final    Comment:    (NOTE) Calculated using the CKD-EPI Creatinine Equation (2021)    eGFR  Date Value Ref Range Status  06/02/2022 111 >59 mL/min/1.73 Final         Passed - CBC within normal limits and completed in the last 12 months    WBC  Date Value Ref Range Status  11/21/2022 10.0 4.0 - 10.5 K/uL Final   RBC  Date Value Ref Range Status  11/21/2022 5.23 4.22 - 5.81 MIL/uL Final   Hemoglobin  Date Value Ref Range Status  11/21/2022 14.6 13.0 - 17.0 g/dL Final  16/12/9602 54.0 13.0 - 17.7 g/dL Final   HCT  Date Value Ref Range Status  11/21/2022 45.0 39.0 - 52.0 % Final   Hematocrit  Date Value Ref Range Status  06/02/2022 39.4 37.5 - 51.0 % Final   MCHC  Date Value Ref Range Status  11/21/2022 32.4 30.0 - 36.0 g/dL Final   [facility]  Date Value Ref Range Status  11/21/2022 27.9 26.0 - 34.0 pg Final   MCV  Date Value Ref Range Status  11/21/2022 86.0 80.0 - 100.0 fL Final  06/02/2022 84 79 - 97 fL Final   No results found for: "PLTCOUNTKUC", "LABPLAT", "POCPLA" RDW  Date Value Ref Range Status  11/21/2022 14.0 11.5 - 15.5 % Final  06/02/2022 13.0 11.6 - 15.4 % Final

## 2023-03-02 NOTE — Telephone Encounter (Signed)
Requested medications are due for refill today.  unsure  Requested medications are on the active medications list.  yes  Last refill. 03/02/2023   Future visit scheduled.   no  Notes to clinic.  Medication was refilled today at another pharmacy. This request is for refill to go to pharmacy in [place]. Please advise.    Requested Prescriptions  Pending Prescriptions Disp Refills   metFORMIN (GLUCOPHAGE-XR) 500 MG 24 hr tablet 360 tablet 3    Sig: Take 2 tablets (1,000 mg total) by mouth 2 (two) times daily with a meal.     Endocrinology:  Diabetes - Biguanides Failed - 02/27/2023  5:37 PM      Failed - B12 Level in normal range and within 720 days    No results found for: "VITAMINB12"       Passed - Cr in normal range and within 360 days    Creatinine, Ser  Date Value Ref Range Status  11/21/2022 0.91 0.61 - 1.24 mg/dL Final         Passed - HBA1C is between 0 and 7.9 and within 180 days    Hgb A1c MFr Bld  Date Value Ref Range Status  08/31/2022 7.6 (H) 4.8 - 5.6 % Final    Comment:             Prediabetes: 5.7 - 6.4          Diabetes: >6.4          Glycemic control for adults with diabetes: <7.0          Passed - eGFR in normal range and within 360 days    GFR calc Af Amer  Date Value Ref Range Status  04/22/2019 123 >59 mL/min/1.73 Final   GFR, Estimated  Date Value Ref Range Status  11/21/2022 >60 >60 mL/min Final    Comment:    (NOTE) Calculated using the CKD-EPI Creatinine Equation (2021)    eGFR  Date Value Ref Range Status  06/02/2022 111 >59 mL/min/1.73 Final         Passed - Valid encounter within last 6 months    Recent Outpatient Visits           3 months ago No-show for appointment   [facility], F[facility]   6 months ago Encounter for annual physical exam   [facility] Mer[facility] Norton T, FNP   8 months ago Hypertension associated with diabetes [facility])   [place] [facility] Merita Norton T, FNP   9 months ago Type 2 diabetes mellitus with morbid obesity [facility])   [place] [facility] Mer[facility] Norton T, FNP   1 year ago Morbid obesity with BMI of 45.0-49.9, adult [facility])   [facility] Merita Norton T, FNP              Passed - CBC within normal limits and completed in the last 12 months    WBC  Date Value Ref Range Status  11/21/2022 10.0 4.0 - 10.5 K/uL Final   RBC  Date Value Ref Range Status  11/21/2022 5.23 4.22 - 5.81 MIL/uL Final   Hemoglobin  Date Value Ref Range Status  11/21/2022 14.6 13.0 - 17.0 g/dL Final  09/81/1914 78.2 13.0 - 17.7 g/dL Final   HCT  Date Value Ref Range Status  11/21/2022 45.0 39.0 - 52.0 % Final   Hematocrit  Date Value Ref Range Status  06/02/2022 39.4 37.5 - 51.0 % Final   MCHC  Date Value Ref Range Status  11/21/2022 32.4 30.0 - 36.0 g/dL Final   [place] [facility]  Date Value Ref Range Status  11/21/2022 27.9 26.0 - 34.0 pg Final   MCV  Date Value Ref Range Status  11/21/2022 86.0 80.0 - 100.0 fL Final  06/02/2022 84 79 - 97 fL Final   No results found for: "PLTCOUNTKUC", "LABPLAT", "POCPLA" RDW  Date Value Ref Range Status  11/21/2022 14.0 11.5 - 15.5 % Final  06/02/2022 13.0 11.6 - 15.4 % Final

## 2023-03-02 NOTE — Telephone Encounter (Signed)
Requested Prescriptions  Pending Prescriptions Disp Refills   losartan-hydrochlorothiazide (HYZAAR) 100-25 MG tablet 90 tablet 1    Sig: Take 0.5 tablets by mouth daily. Restart 11/22/22 as long as to[ number on BP higher than 140. Can resume 1 whole tablet if/when BP top number still higher than 140 after 3-4 days on half tablet     Cardiovascular: ARB + Diuretic Combos Failed - 03/01/2023  3:55 PM      Failed - Valid encounter within last 6 months    Recent Outpatient Visits           3 months ago No-show for appointment   [facility], FNP   6 months ago Encounter for annual physical exam   [facility] Mer[facility] T, FNP   8 months ago Hypertension associated with diabetes [facility])   [place] [facility] Merita Norton T, FNP   9 months ago Type 2 diabetes mellitus with morbid obesity [facility])   [place] [facility] Merita Norton T, FNP   1 year ago Morbid obesity with BMI of 45.0-49.9, adult [facility])   [place] [facility] Jacky Kindle, FNP              Passed - K in normal range and within 180 days    Potassium  Date Value Ref Range Status  11/21/2022 4.1 3.5 - 5.1 mmol/L Final         Passed - Na in normal range and within 180 days    Sodium  Date Value Ref Range Status  11/21/2022 138 135 - 145 mmol/L Final  06/02/2022 143 134 - 144 mmol/L Final         Passed - Cr in normal range and within 180 days    Creatinine, Ser  Date Value Ref Range Status  11/21/2022 0.91 0.61 - 1.24 mg/dL Final         Passed - eGFR is 10 or above and within 180 days    GFR calc Af Amer  Date Value Ref Range Status  04/22/2019 123 >59 mL/min/1.73 Final   GFR, Estimated  Date Value Ref Range Status  11/21/2022 >60 >60 mL/min Final    Comment:    (NOTE) Calculated using the CKD-EPI Creatinine Equation (2021)    eGFR  Date Value Ref Range Status  06/02/2022 111 >59  mL/min/1.73 Final         Passed - Patient is not pregnant      Passed - Last BP in normal range    BP Readings from Last 1 Encounters:  11/21/22 133/75          metFORMIN (GLUCOPHAGE-XR) 500 MG 24 hr tablet 360 tablet 0    Sig: Take 2 tablets (1,000 mg total) by mouth 2 (two) times daily with a meal.     Endocrinology:  Diabetes - Biguanides Failed - 03/01/2023  3:55 PM      Failed - HBA1C is between 0 and 7.9 and within 180 days    Hgb A1c MFr Bld  Date Value Ref Range Status  08/31/2022 7.6 (H) 4.8 - 5.6 % Final    Comment:             Prediabetes: 5.7 - 6.4          Diabetes: >6.4          Glycemic control for adults with diabetes: <7.0  Failed - B12 Level in normal range and within 720 days    No results found for: "VITAMINB12"       Failed - Valid encounter within last 6 months    Recent Outpatient Visits           3 months ago No-show for appointment   [facility] Merita Norton T, FNP   6 months ago Encounter for annual physical exam   [facility], FNP   8 months ago Hypertension associated with diabetes [facility])   [place] [facility] Mer[facility] Norton T, FNP   9 months ago Type 2 diabetes mellitus with morbid obesity [facility])   [place] [facility] Merita Norton T, FNP   1 year ago Morbid obesity with BMI of 45.0-49.9, adult [facility])   [facility] Jacky Kindle, FNP              Passed - Cr in normal range and within 360 days    Creatinine, Ser  Date Value Ref Range Status  11/21/2022 0.91 0.61 - 1.24 mg/dL Final         Passed - eGFR in normal range and within 360 days    GFR calc Af Amer  Date Value Ref Range Status  04/22/2019 123 >59 mL/min/1.73 Final   GFR, Estimated  Date Value Ref Range Status  11/21/2022 >60 >60 mL/min Final    Comment:    (NOTE) Calculated using the CKD-EPI Creatinine Equation (2021)    eGFR   Date Value Ref Range Status  06/02/2022 111 >59 mL/min/1.73 Final         Passed - CBC within normal limits and completed in the last 12 months    WBC  Date Value Ref Range Status  11/21/2022 10.0 4.0 - 10.5 K/uL Final   RBC  Date Value Ref Range Status  11/21/2022 5.23 4.22 - 5.81 MIL/uL Final   Hemoglobin  Date Value Ref Range Status  11/21/2022 14.6 13.0 - 17.0 g/dL Final  16/12/9602 54.0 13.0 - 17.7 g/dL Final   HCT  Date Value Ref Range Status  11/21/2022 45.0 39.0 - 52.0 % Final   Hematocrit  Date Value Ref Range Status  06/02/2022 39.4 37.5 - 51.0 % Final   MCHC  Date Value Ref Range Status  11/21/2022 32.4 30.0 - 36.0 g/dL Final   [facility]  Date Value Ref Range Status  11/21/2022 27.9 26.0 - 34.0 pg Final   MCV  Date Value Ref Range Status  11/21/2022 86.0 80.0 - 100.0 fL Final  06/02/2022 84 79 - 97 fL Final   No results found for: "PLTCOUNTKUC", "LABPLAT", "POCPLA" RDW  Date Value Ref Range Status  11/21/2022 14.0 11.5 - 15.5 % Final  06/02/2022 13.0 11.6 - 15.4 % Final

## 2023-03-02 NOTE — Telephone Encounter (Signed)
Contacted patient to verify if he is still taking 0.5 tablet daily or 1 tablet daily.  LVMTCB. CRM created. Ok for [facility] to clarify

## 2023-03-03 ENCOUNTER — Other Ambulatory Visit: Payer: Self-pay | Admitting: Family Medicine

## 2023-03-03 ENCOUNTER — Telehealth: Payer: Self-pay

## 2023-03-03 MED ORDER — LOSARTAN POTASSIUM-HCTZ 100-25 MG PO TABS: 1.0000 | ORAL_TABLET | Freq: Every day | ORAL | 3 refills | Status: AC

## 2023-06-05 ENCOUNTER — Other Ambulatory Visit: Payer: Self-pay

## 2023-06-05 ENCOUNTER — Telehealth: Payer: Self-pay | Admitting: Family Medicine

## 2023-06-05 DIAGNOSIS — E559 Vitamin D deficiency, unspecified: Secondary | ICD-10-CM

## 2023-06-05 DIAGNOSIS — E1169 Type 2 diabetes mellitus with other specified complication: Secondary | ICD-10-CM

## 2023-06-05 NOTE — Telephone Encounter (Signed)
[facility] faxed refill request for the following medications:    metFORMIN (GLUCOPHAGE-XR) 500 MG 24 hr tablet   Please advise.  

## 2023-06-06 MED ORDER — METFORMIN HCL ER 500 MG PO TB24
1000.0000 mg | ORAL_TABLET | Freq: Two times a day (BID) | ORAL | 0 refills | Status: AC
Start: 2023-06-06 — End: ?

## 2023-06-23 ENCOUNTER — Other Ambulatory Visit: Payer: Self-pay | Admitting: Family Medicine

## 2023-06-23 DIAGNOSIS — E1169 Type 2 diabetes mellitus with other specified complication: Secondary | ICD-10-CM

## 2023-06-23 DIAGNOSIS — E559 Vitamin D deficiency, unspecified: Secondary | ICD-10-CM

## 2023-06-23 MED ORDER — FENOFIBRATE 145 MG PO TABS
145.0000 mg | ORAL_TABLET | Freq: Every day | ORAL | 0 refills | Status: AC
Start: 2023-06-23 — End: ?

## 2023-06-23 MED ORDER — ROSUVASTATIN CALCIUM 20 MG PO TABS
20.0000 mg | ORAL_TABLET | Freq: Every day | ORAL | 0 refills | Status: AC
Start: 2023-06-23 — End: ?

## 2023-06-23 NOTE — Telephone Encounter (Signed)
 [facility] faxed refill request for the following medications:   rosuvastatin (CRESTOR) 20 MG tablet     fenofibrate (TRICOR) 145 MG tablet      Please advise.

## 2023-07-29 ENCOUNTER — Other Ambulatory Visit: Payer: Self-pay

## 2023-07-29 ENCOUNTER — Emergency Department
Admission: EM | Admit: 2023-07-29 | Discharge: 2023-07-29 | Disposition: A | Discharge status: Home / Self Care | Payer: Self-pay | Attending: Emergency Medicine | Admitting: Emergency Medicine

## 2023-07-29 DIAGNOSIS — L259 Unspecified contact dermatitis, unspecified cause: Secondary | ICD-10-CM | POA: Insufficient documentation

## 2023-07-29 DIAGNOSIS — I10 Essential (primary) hypertension: Secondary | ICD-10-CM | POA: Insufficient documentation

## 2023-07-29 DIAGNOSIS — E119 Type 2 diabetes mellitus without complications: Secondary | ICD-10-CM | POA: Insufficient documentation

## 2023-07-29 DIAGNOSIS — R21 Rash and other nonspecific skin eruption: Secondary | ICD-10-CM

## 2023-07-29 HISTORY — DX: Pure hypercholesterolemia, unspecified: E78.00

## 2023-07-29 MED ORDER — NAPROXEN 500 MG PO TABS: 500.0000 mg | ORAL_TABLET | Freq: Two times a day (BID) | ORAL | 2 refills | Status: AC

## 2023-07-29 MED ORDER — PREDNISONE 10 MG (21) PO TBPK: ORAL_TABLET | ORAL | 0 refills | Status: AC

## 2023-07-29 NOTE — ED Provider Notes (Signed)
 [facility] Provider Note    Event Date/Time   First MD Initiated Contact with Patient 07/29/23 1152     (approximate)   History   Rash   HPI  Steve [name] is a 48 y.o. male history of diabetes, high cholesterol, hypertension presents emergency department with rash on the hands arms and face.  Patient states he was working outside prior to the rash starting.  Did not see any poison oak or ivy that he could have gotten into.  Is not itchy like chiggers.  Just painful on the palms      Physical Exam   Triage Vital Signs: ED Triage Vitals  Encounter Vitals Group     BP 07/29/23 1145 128/86     Systolic BP Percentile --      Diastolic BP Percentile --      Pulse Rate 07/29/23 1145 85     Resp 07/29/23 1145 20     Temp 07/29/23 1145 98.6 F (37 C)     Temp Source 07/29/23 1145 Oral     SpO2 07/29/23 1145 98 %     Weight 07/29/23 1141 300 lb (136.1 kg)     Height 07/29/23 1141 5\' 10"  (1.778 m)     Head Circumference --      Peak Flow --      Pain Score 07/29/23 1141 6     Pain Loc --      Pain Education --      Exclude from Growth Chart --     Most recent vital signs: Vitals:   07/29/23 1145  BP: 128/86  Pulse: 85  Resp: 20  Temp: 98.6 F (37 C)  SpO2: 98%     General: Awake, no distress.   CV:  Good peripheral perfusion. regular rate and  rhythm Resp:  Normal effort.  Abd:  No distention.   Other:  Palms with red areas on the palm and the fingers, areas are blisterlike, tender to palpation, it is bilateral so not associated with softer, some of the same rashes noted on the arms and face   ED Results / Procedures / Treatments   Labs (all labs ordered are listed, but only abnormal results are displayed) Labs Reviewed - No data to display   EKG     RADIOLOGY     PROCEDURES:   Procedures Chief Complaint  Patient presents with   Rash      MEDICATIONS ORDERED IN ED: Medications - No data to  display   IMPRESSION / MDM / ASSESSMENT AND PLAN / ED COURSE  I reviewed the triage vital signs and the nursing notes.                              Differential diagnosis includes, but is not limited to, contact dermatitis, zoster, hand-foot-and-mouth  Patient's presentation is most consistent with acute, uncomplicated illness.   Due to possible contact dermatitis start patient on a Sterapred Dosepak, Naprosyn for pain.  Explained to the patient that if it is hand-foot-and-mouth it may get a little bit worse but do not feel that the distribution is the same as hand-foot-and-mouth.  Would not be shingles as it is not in a dermatome.  Patient states he understands.  Strict instructions to return if worsening.  Discharged stable condition.      FINAL CLINICAL IMPRESSION(S) / ED DIAGNOSES   Final diagnoses:  Rash  Contact  dermatitis, unspecified contact dermatitis type, unspecified trigger     Rx / DC Orders   ED Discharge Orders          Ordered    predniSONE  (STERAPRED UNI-PAK 21 TAB) 10 MG (21) TBPK tablet        07/29/23 1152    naproxen (NAPROSYN) 500 MG tablet  2 times daily with meals        07/29/23 1152             Note:  This document was prepared using Dragon voice recognition software and may include unintentional dictation errors.    Delsie Figures, PA-C 07/29/23 1209    Lind Repine, MD 07/29/23 [phone]

## 2023-07-29 NOTE — ED Triage Notes (Signed)
 Pt to ED for red raised rash/lesions to both palms, R face, both arms since 2 days ago. Palms of hands--skin--painful, prickling. No itching. Pt has been working outside this week. Skin is sensitive.

## 2023-09-21 NOTE — Telephone Encounter (Signed)
 Contacted patient to verify how he is currently taking his losartan -hydrochlorothiazide   CRM created. Ok for [facility] to verify and addend telephone encounter from 02/27/23 for medication refill
